# Patient Record
Sex: Female | Born: 1955
Health system: Southern US, Community
[De-identification: ages and names within clinical notes are randomized; demographics above are authoritative.]

## PROBLEM LIST (undated history)

## (undated) DIAGNOSIS — G4733 Obstructive sleep apnea (adult) (pediatric): Secondary | ICD-10-CM

## (undated) DIAGNOSIS — M1990 Unspecified osteoarthritis, unspecified site: Secondary | ICD-10-CM

## (undated) DIAGNOSIS — I10 Essential (primary) hypertension: Secondary | ICD-10-CM

## (undated) HISTORY — DX: Essential (primary) hypertension: I10

## (undated) HISTORY — DX: Unspecified osteoarthritis, unspecified site: M19.90

## (undated) HISTORY — PX: BREAST BIOPSY: SHX20

## (undated) HISTORY — DX: Obstructive sleep apnea (adult) (pediatric): G47.33

## (undated) HISTORY — PX: OTHER SURGICAL HISTORY: SHX169

## (undated) HISTORY — PX: CHOLECYSTECTOMY: SHX55

---

## 1994-05-31 HISTORY — PX: CHOLECYSTECTOMY, LAPAROSCOPIC: SHX56

## 1998-07-10 ENCOUNTER — Encounter: Payer: Self-pay | Admitting: Obstetrics and Gynecology

## 1998-07-10 ENCOUNTER — Ambulatory Visit (HOSPITAL_COMMUNITY): Admission: RE | Admit: 1998-07-10 | Discharge: 1998-07-10 | Payer: Self-pay | Admitting: Obstetrics and Gynecology

## 1998-07-11 ENCOUNTER — Other Ambulatory Visit: Admission: RE | Admit: 1998-07-11 | Discharge: 1998-07-11 | Payer: Self-pay | Admitting: Obstetrics and Gynecology

## 2001-05-26 ENCOUNTER — Encounter: Payer: Self-pay | Admitting: Internal Medicine

## 2001-05-26 ENCOUNTER — Ambulatory Visit (HOSPITAL_COMMUNITY): Admission: RE | Admit: 2001-05-26 | Discharge: 2001-05-26 | Payer: Self-pay | Admitting: Internal Medicine

## 2003-05-03 ENCOUNTER — Ambulatory Visit (HOSPITAL_COMMUNITY): Admission: RE | Admit: 2003-05-03 | Discharge: 2003-05-03 | Payer: Self-pay | Admitting: Internal Medicine

## 2007-09-28 ENCOUNTER — Ambulatory Visit (HOSPITAL_COMMUNITY): Admission: RE | Admit: 2007-09-28 | Discharge: 2007-09-28 | Payer: Self-pay | Admitting: Internal Medicine

## 2009-08-05 ENCOUNTER — Ambulatory Visit (HOSPITAL_COMMUNITY): Admission: RE | Admit: 2009-08-05 | Discharge: 2009-08-05 | Payer: Self-pay | Admitting: Internal Medicine

## 2010-12-24 ENCOUNTER — Other Ambulatory Visit: Payer: Self-pay | Admitting: Internal Medicine

## 2010-12-24 DIAGNOSIS — Z1231 Encounter for screening mammogram for malignant neoplasm of breast: Secondary | ICD-10-CM

## 2010-12-31 ENCOUNTER — Ambulatory Visit (HOSPITAL_COMMUNITY)
Admission: RE | Admit: 2010-12-31 | Discharge: 2010-12-31 | Disposition: A | Discharge status: Home / Self Care | Payer: 59 | Source: Ambulatory Visit | Attending: Internal Medicine | Admitting: Internal Medicine

## 2010-12-31 DIAGNOSIS — Z1231 Encounter for screening mammogram for malignant neoplasm of breast: Secondary | ICD-10-CM

## 2011-05-15 ENCOUNTER — Ambulatory Visit (INDEPENDENT_AMBULATORY_CARE_PROVIDER_SITE_OTHER): Payer: 59

## 2011-05-15 DIAGNOSIS — J019 Acute sinusitis, unspecified: Secondary | ICD-10-CM

## 2011-06-01 DIAGNOSIS — G4733 Obstructive sleep apnea (adult) (pediatric): Secondary | ICD-10-CM

## 2011-06-01 HISTORY — DX: Obstructive sleep apnea (adult) (pediatric): G47.33

## 2011-06-02 ENCOUNTER — Ambulatory Visit (INDEPENDENT_AMBULATORY_CARE_PROVIDER_SITE_OTHER): Payer: 59

## 2011-06-02 DIAGNOSIS — J019 Acute sinusitis, unspecified: Secondary | ICD-10-CM

## 2011-06-02 DIAGNOSIS — H73019 Bullous myringitis, unspecified ear: Secondary | ICD-10-CM

## 2011-06-25 ENCOUNTER — Ambulatory Visit (INDEPENDENT_AMBULATORY_CARE_PROVIDER_SITE_OTHER): Payer: 59

## 2011-06-25 ENCOUNTER — Other Ambulatory Visit: Payer: Self-pay | Admitting: Family Medicine

## 2011-06-25 DIAGNOSIS — M79609 Pain in unspecified limb: Secondary | ICD-10-CM

## 2011-06-25 DIAGNOSIS — N951 Menopausal and female climacteric states: Secondary | ICD-10-CM

## 2011-06-25 DIAGNOSIS — Z0000 Encounter for general adult medical examination without abnormal findings: Secondary | ICD-10-CM

## 2011-06-25 DIAGNOSIS — I10 Essential (primary) hypertension: Secondary | ICD-10-CM

## 2011-06-29 LAB — PAP IG (IMAGE GUIDED)

## 2011-07-05 ENCOUNTER — Other Ambulatory Visit: Payer: Self-pay | Admitting: Family Medicine

## 2011-07-05 MED ORDER — LISINOPRIL-HYDROCHLOROTHIAZIDE 10-12.5 MG PO TABS: 1.0000 | ORAL_TABLET | Freq: Every day | ORAL | Status: DC

## 2011-07-07 ENCOUNTER — Telehealth: Payer: Self-pay

## 2011-07-07 NOTE — Telephone Encounter (Signed)
SPOKE WITH PT AND ADVISED PAP NORMAL BUT NOW WANTS TO KNOW ABOUT HER BLOODWORK? PLEASE ADVISE

## 2011-07-27 ENCOUNTER — Telehealth: Payer: Self-pay

## 2011-07-27 NOTE — Telephone Encounter (Signed)
Medical records please put the paper chart [phone] in referrals stack need them to finish a referral thanks donna

## 2011-09-29 ENCOUNTER — Ambulatory Visit (INDEPENDENT_AMBULATORY_CARE_PROVIDER_SITE_OTHER): Payer: 59 | Admitting: Internal Medicine

## 2011-09-29 ENCOUNTER — Encounter: Payer: Self-pay | Admitting: Internal Medicine

## 2011-09-29 VITALS — BP 123/73 | HR 81 | Temp 98.4°F | Resp 16 | Ht 66.0 in | Wt 236.0 lb

## 2011-09-29 DIAGNOSIS — M542 Cervicalgia: Secondary | ICD-10-CM

## 2011-09-29 DIAGNOSIS — R209 Unspecified disturbances of skin sensation: Secondary | ICD-10-CM

## 2011-09-29 DIAGNOSIS — G4730 Sleep apnea, unspecified: Secondary | ICD-10-CM

## 2011-09-29 DIAGNOSIS — R202 Paresthesia of skin: Secondary | ICD-10-CM

## 2011-09-29 DIAGNOSIS — Z6833 Body mass index (BMI) 33.0-33.9, adult: Secondary | ICD-10-CM

## 2011-09-29 MED ORDER — FLUTICASONE PROPIONATE 50 MCG/ACT NA SUSP: 2.0000 | Freq: Every day | NASAL | Status: DC

## 2011-09-29 MED ORDER — LISINOPRIL-HYDROCHLOROTHIAZIDE 10-12.5 MG PO TABS: 1.0000 | ORAL_TABLET | Freq: Every day | ORAL | Status: DC

## 2011-09-29 NOTE — Progress Notes (Signed)
  Subjective:    Patient ID: Jamie Glover, female    DOB: 05-20-1956, 56 y.o.   MRN: [id number]  HPIShe is very concerned about paresthesias and pain in both hands that wake her up at night. These chiefly concerned the thumb and first 2 fingers on both hands. This started 6 months ago and has increased in frequency and intensity ever since. The pain in the left arm will move up toward the elbow at times. This resolves within 15-30 minutes of awakening. She cannot attach the occurrence to a particular sleeping position. She does not have daytime weakness. She spends a lot of time at the computer keyboard but has no carpal tunnel symptoms in the daytime.  She has a new diagnosis of obstructive sleep apnea and will start on CPAP next week. Her history suggests this diagnosis could have been made 6-7 years ago   Social history= has started yoga  Review of Systems  Constitutional: Negative for fever, activity change and unexpected weight change.  HENT: Positive for neck stiffness and tinnitus. Negative for ear pain.   Eyes: Negative for visual disturbance.  Neurological: Negative for tremors, weakness, light-headedness and headaches.  Psychiatric/Behavioral: Negative for dysphoric mood. The patient is not nervous/anxious.   She's had 2 ear infections requiring antibiotics in the last year one of these had a traumatic perforation. She has no hearing loss from this but continues with her chronic tinnitus which is mild/she notes clicking in her right ear with swallowing//She is on Claritin and Flonase     Objective:   Physical Exam Vital signs stable/She is overweight Pupils equal round reactive to light and accommodation/extraocular movements conjugate  TMs are patent though the left is opaque Nares are boggy  throat is clear/Pharynx is shallow No nodes or thyromegaly The neck range of motion is full without stiffness or pain Except for mild limitation of forward flexion Upper extremities have  full pulses and full sensation with good range of motion about the  Shoulders and elbows and wrists/finger thumb opposition is full/grip is symmetrical/reflexes are not exaggerated       Assessment & Plan:  Problem #1 paresthesias and pain in both hands Needs MRI of the C-spine to rule out cervical stenosis Will consider EMG These symptoms suggest a correlation with posture since they occur only at night With mild increase in reflexes at the left wrist compared to the right will need this retested at subsequent visits  Problem #2 obesity Problem #3 obstructive sleep apnea Problem #4 allergic rhinitis Problem #5 hypertension

## 2011-10-01 DIAGNOSIS — K219 Gastro-esophageal reflux disease without esophagitis: Secondary | ICD-10-CM | POA: Insufficient documentation

## 2011-10-01 DIAGNOSIS — E559 Vitamin D deficiency, unspecified: Secondary | ICD-10-CM | POA: Insufficient documentation

## 2011-10-01 DIAGNOSIS — H9319 Tinnitus, unspecified ear: Secondary | ICD-10-CM | POA: Insufficient documentation

## 2011-10-01 DIAGNOSIS — I10 Essential (primary) hypertension: Secondary | ICD-10-CM | POA: Insufficient documentation

## 2011-10-01 DIAGNOSIS — M542 Cervicalgia: Secondary | ICD-10-CM | POA: Insufficient documentation

## 2011-10-11 ENCOUNTER — Ambulatory Visit
Admission: RE | Admit: 2011-10-11 | Discharge: 2011-10-11 | Disposition: A | Discharge status: Home / Self Care | Payer: 59 | Source: Ambulatory Visit | Attending: Internal Medicine | Admitting: Internal Medicine

## 2011-10-11 DIAGNOSIS — R202 Paresthesia of skin: Secondary | ICD-10-CM

## 2011-10-19 ENCOUNTER — Telehealth: Payer: Self-pay | Admitting: Radiology

## 2011-10-19 DIAGNOSIS — M5412 Radiculopathy, cervical region: Secondary | ICD-10-CM

## 2011-10-19 NOTE — Telephone Encounter (Signed)
Pt CB and I explained results and message from Dr Merla Riches. Pt agreed to neuro referral.

## 2011-10-19 NOTE — Telephone Encounter (Signed)
Message copied by Luretha Murphy on Tue Oct 19, 2011  2:14 PM ------      Message from: POE, Dinah Beers C      Created: Mon Oct 18, 2011  8:26 AM                   ----- Message -----         From: Tonye Pearson, MD         Sent: 10/16/2011  10:40 AM           To: [facility]            Please call      The MRI Shows some chronic changes but nothing dangerous and does not specifically identify a cause for the symptoms she has      This may respond to physical therapy for the neck but first we should set up a neurological evaluation if she is agreeable

## 2011-10-19 NOTE — Telephone Encounter (Signed)
LMOM TO CB 

## 2011-10-20 ENCOUNTER — Encounter: Payer: Self-pay | Admitting: Neurology

## 2011-11-08 ENCOUNTER — Ambulatory Visit (INDEPENDENT_AMBULATORY_CARE_PROVIDER_SITE_OTHER): Payer: 59 | Admitting: Family Medicine

## 2011-11-08 VITALS — BP 126/76 | HR 73 | Temp 98.1°F | Resp 16 | Ht 66.0 in | Wt 240.0 lb

## 2011-11-08 DIAGNOSIS — R300 Dysuria: Secondary | ICD-10-CM

## 2011-11-08 DIAGNOSIS — R350 Frequency of micturition: Secondary | ICD-10-CM

## 2011-11-08 LAB — POCT URINALYSIS DIPSTICK
Glucose, UA: NEGATIVE
Nitrite, UA: NEGATIVE
Spec Grav, UA: 1.015
Urobilinogen, UA: 0.2

## 2011-11-08 LAB — POCT UA - MICROSCOPIC ONLY: Epithelial cells, urine per micros: NEGATIVE

## 2011-11-08 MED ORDER — CEPHALEXIN 500 MG PO CAPS: 500.0000 mg | ORAL_CAPSULE | Freq: Three times a day (TID) | ORAL | Status: AC

## 2011-11-08 NOTE — Patient Instructions (Signed)

## 2011-11-08 NOTE — Progress Notes (Signed)
  Subjective:    Patient ID: Jamie Glover, female    DOB: 1956/04/20, 56 y.o.   MRN: [id number]  HPI DYSURIA Onset:  2 days  Description: dysuria, increased urinary frequency, increased urgency  Modifying factors: history of UTIs in the past   Symptoms Urgency:  yes Frequency: yes  Hesitancy:  no Hematuria:  no Flank Pain:  no Fever: no Nausea/Vomiting:  no Missed LMP: no STD exposure: no Discharge: no Irritants: no Rash: no  Red Flags   More than 3 UTI's last 12 months:  no PMH of  Diabetes or Immunosuppression:  no Renal Disease/Calculi: no Urinary Tract Abnormality:  no Instrumentation or Trauma: no   Review of Systems See HPI, otherwise ROS negative     Objective:   Physical Exam Gen: up in chair, NAD HEENT: NCAT, EOMI, TMs clear bilaterally CV: RRR, no murmurs auscultated PULM: CTAB, no wheezes, rales, rhoncii ABD: S/NT/+ bowel sounds, no suprapubic tenderness EXT: 2+ peripheral pulses    Assessment & Plan:   Clinically with UTI. Will treat with keflex x 7 days. Urine culture.  Discussed infectious red flags.  Follow up as needed.  Handout given.     The patient and/or caregiver has been counseled thoroughly with regard to treatment plan and/or medications prescribed including dosage, schedule, interactions, rationale for use, and possible side effects and they verbalize understanding. Diagnoses and expected course of recovery discussed and will return if not improved as expected or if the condition worsens. Patient and/or caregiver verbalized understanding.

## 2011-11-11 LAB — URINE CULTURE: Colony Count: >=100000

## 2011-11-23 ENCOUNTER — Telehealth: Payer: Self-pay

## 2011-11-23 NOTE — Telephone Encounter (Signed)
Dineen, Conradt would like you to call her regarding her latest OV. Cell phone until 3:30  [phone]

## 2011-11-23 NOTE — Telephone Encounter (Signed)
Spoke with patient. She is no longer having any urinary symptoms so she will follow up if any symptoms return per Dr. Furnas Blas request.

## 2011-12-09 ENCOUNTER — Ambulatory Visit (INDEPENDENT_AMBULATORY_CARE_PROVIDER_SITE_OTHER): Payer: 59 | Admitting: Neurology

## 2011-12-09 ENCOUNTER — Encounter: Payer: Self-pay | Admitting: Neurology

## 2011-12-09 VITALS — BP 118/76 | HR 74 | Ht 66.25 in | Wt 243.0 lb

## 2011-12-09 DIAGNOSIS — M5412 Radiculopathy, cervical region: Secondary | ICD-10-CM

## 2011-12-09 NOTE — Progress Notes (Signed)
Dear Dr. Merla Riches,  Thank you for having me see Jamie Glover in consultation today at [facility] for her problem with bilateral numbness in her hands and arms.  As you may recall, she is a 56 y.o. year old female with a history of HTN and OSA who is having problems mostly at night with tingling in her 1st and 2nd digit of both hands, worse on the left.  This radiates to her forearms.  Somewhat better when she shakes it out.  No neck pain. MRI of C-spine unremarkable.  Better during the day.  Past Medical History  Diagnosis Date  . Hypertension   . OSA (obstructive sleep apnea) 2013    Past Surgical History  Procedure Date  . C-sections   . Cholecystectomy, laparoscopic 1996  . Right knee surgery     History   Social History  . Marital Status: Married    Spouse Name: N/A    Number of Children: N/A  . Years of Education: N/A   Social History Main Topics  . Smoking status: Never Smoker   . Smokeless tobacco: Never Used  . Alcohol Use: No  . Drug Use: No  . Sexually Active: None   Other Topics Concern  . None   Social History Narrative  . None    No family history on file.  Current Outpatient Prescriptions on File Prior to Visit  Medication Sig Dispense Refill  . aspirin 325 MG tablet Take 325 mg by mouth daily.      . calcium-vitamin D 250-100 MG-UNIT per tablet Take by mouth daily.      . fish oil-omega-3 fatty acids 1000 MG capsule Take 2 g by mouth daily.      . fluticasone (FLONASE) 50 MCG/ACT nasal spray Place 2 sprays into the nose daily.  16 g  11  . lisinopril-hydrochlorothiazide (PRINZIDE,ZESTORETIC) 10-12.5 MG per tablet Take 1 tablet by mouth daily.  90 tablet  3  . Loratadine-Pseudoephedrine (PX ALLERGY RELIEF D, LORATID, PO) Take by mouth.      Marland Kitchen MAGNESIUM PO Take by mouth daily.      . Multiple Vitamin (MULTIVITAMIN) tablet Take 1 tablet by mouth daily.      . Red Yeast Rice Extract (RED YEAST RICE PO) Take by mouth daily.        Allergies    Allergen Reactions  . Other Other (See Comments)    ORAL ALCOHOL CAUSES HEART RACE (TACHYCARDIA0  . Macrodantin (Nitrofurantoin Macrocrystal) Nausea Only and Rash      ROS:  13 systems were reviewed and  are unremarkable.   Examination:  Filed Vitals:   12/09/11 1322  BP: 118/76  Pulse: 74  Height: 5' 6.25" (1.683 m)  Weight: 243 lb (110.224 kg)     In general, well appearing women.   Cranial Nerves: Extraocular movements are intact without nystagmus. Facial sensation and muscles of mastication are intact. Muscles of facial expression are symmetric.  Tongue protrusion, uvula, palate midline.  Shoulder shrug intact  Motor:  The patient has normal bulk and tone, no pronator drift.  There are no adventitious movements.  Intrinsic muscles of hands strong.  5/5 muscle strength bilaterally.  Reflexes:   Biceps  Triceps Brachioradialis Knee Ankle  Right 2+  2+  2+   2+ 2+  Left  2+  2+  2+   2+ 2+  Toes down  Coordination:  Normal finger to nose.  No dysdiadokinesia.  Sensation is impaired in a median nerve  distribution(but sparing palm) in bilateral hands to pin.  Gait and Station are normal.  Tandem gait is intact.  Romberg is negative  Tinel's negative at wrist, Phalen's positive.   Impression/Recs: 1.  Bilateral hand tingling and numbness - this is almost certainly CTS.  It commonly radiates up the arm.  I would get a NCS to confirm but patient would like to wait on this.  She will call if it worsens.   Thank you for having [place] see Jamie Glover in consultation.  Feel free to contact me with any questions.  Lupita Raider Modesto Charon, MD [facility],  [address] [place], [place] [address] Phone: [phone] Fax: [phone].

## 2012-01-24 ENCOUNTER — Other Ambulatory Visit: Payer: Self-pay | Admitting: Internal Medicine

## 2012-01-24 DIAGNOSIS — Z1231 Encounter for screening mammogram for malignant neoplasm of breast: Secondary | ICD-10-CM

## 2012-01-27 ENCOUNTER — Ambulatory Visit (HOSPITAL_COMMUNITY)
Admission: RE | Admit: 2012-01-27 | Discharge: 2012-01-27 | Disposition: A | Discharge status: Home / Self Care | Payer: 59 | Source: Ambulatory Visit | Attending: Internal Medicine | Admitting: Internal Medicine

## 2012-01-27 DIAGNOSIS — Z1231 Encounter for screening mammogram for malignant neoplasm of breast: Secondary | ICD-10-CM | POA: Insufficient documentation

## 2012-06-26 ENCOUNTER — Ambulatory Visit (INDEPENDENT_AMBULATORY_CARE_PROVIDER_SITE_OTHER): Payer: 59 | Admitting: Physician Assistant

## 2012-06-26 VITALS — BP 129/76 | HR 100 | Temp 99.0°F | Resp 17 | Ht 67.0 in | Wt 241.0 lb

## 2012-06-26 DIAGNOSIS — R350 Frequency of micturition: Secondary | ICD-10-CM

## 2012-06-26 DIAGNOSIS — R300 Dysuria: Secondary | ICD-10-CM

## 2012-06-26 DIAGNOSIS — N390 Urinary tract infection, site not specified: Secondary | ICD-10-CM

## 2012-06-26 LAB — POCT URINALYSIS DIPSTICK
Bilirubin, UA: NEGATIVE
Glucose, UA: NEGATIVE
Ketones, UA: NEGATIVE
Nitrite, UA: NEGATIVE
Protein, UA: 30
Spec Grav, UA: 1.02
Urobilinogen, UA: 1
pH, UA: 7

## 2012-06-26 LAB — POCT UA - MICROSCOPIC ONLY
Casts, Ur, LPF, POC: NEGATIVE
Crystals, Ur, HPF, POC: NEGATIVE
Mucus, UA: NEGATIVE
Yeast, UA: NEGATIVE

## 2012-06-26 MED ORDER — CEPHALEXIN 500 MG PO CAPS: 500.0000 mg | ORAL_CAPSULE | Freq: Three times a day (TID) | ORAL | Status: DC

## 2012-06-26 NOTE — Progress Notes (Signed)
Patient ID: Jamie Glover MRN: [id number], DOB: 07-Apr-1956, 57 y.o. Date of Encounter: 06/26/2012, 10:27 AM  Primary Physician: Tonye Pearson, MD  Chief Complaint: urinary frequency and dysuria  HPI: 57 y.o. year old female with presents with 2 day history of urinary frequency, dysuria, and hematuria. Complains of slight right sided flank pain.  No nausea, vomiting, fever, chills, abdominal pain, vaginal discharge, or odor. Has not taken any medications. Patient is otherwise doing well without issues or complaints.  Past Medical History  Diagnosis Date  . Hypertension   . OSA (obstructive sleep apnea) 2013     Home Meds: Prior to Admission medications   Medication Sig Start Date End Date Taking? Authorizing Provider  aspirin 325 MG tablet Take 325 mg by mouth daily.   Yes Historical Provider, MD  calcium-vitamin D 250-100 MG-UNIT per tablet Take by mouth daily.   Yes Historical Provider, MD  fish oil-omega-3 fatty acids 1000 MG capsule Take 2 g by mouth daily.   Yes Historical Provider, MD  fluticasone (FLONASE) 50 MCG/ACT nasal spray Place 2 sprays into the nose daily. 09/29/11  Yes Tonye Pearson, MD  lisinopril-hydrochlorothiazide (PRINZIDE,ZESTORETIC) 10-12.5 MG per tablet Take 1 tablet by mouth daily. 09/29/11 09/28/12 Yes Tonye Pearson, MD  Loratadine-Pseudoephedrine [facility] ALLERGY RELIEF D, LORATID, PO) Take by mouth.   Yes Historical Provider, MD  MAGNESIUM PO Take by mouth daily.   Yes Historical Provider, MD  Multiple Vitamin (MULTIVITAMIN) tablet Take 1 tablet by mouth daily.   Yes Historical Provider, MD  Red Yeast Rice Extract (RED YEAST RICE PO) Take by mouth daily.   Yes Historical Provider, MD  cephALEXin (KEFLEX) 500 MG capsule Take 1 capsule (500 mg total) by mouth 3 (three) times daily. 06/26/12   Nelva Nay, PA-C    Allergies:  Allergies  Allergen Reactions  . Other Other (See Comments)    ORAL ALCOHOL CAUSES HEART RACE (TACHYCARDIA0  . Macrodantin  (Nitrofurantoin Macrocrystal) Nausea Only and Rash    History   Social History  . Marital Status: Married    Spouse Name: N/A    Number of Children: N/A  . Years of Education: N/A   Occupational History  . Not on file.   Social History Main Topics  . Smoking status: Never Smoker   . Smokeless tobacco: Never Used  . Alcohol Use: No  . Drug Use: No  . Sexually Active: Not on file   Other Topics Concern  . Not on file   Social History Narrative  . No narrative on file     Review of Systems: Constitutional: negative for chills, fever, night sweats, weight changes, or fatigue  HEENT: negative for vision changes, hearing loss, congestion, rhinorrhea, ST, epistaxis, or sinus pressure Cardiovascular: negative for chest pain or palpitations Respiratory: negative for cough, hemoptysis, wheezing, shortness of breath. Abdominal: positive for suprapubic abdominal pain,   No nausea, vomiting, diarrhea, or constipation Genitourinary: positive for urinary frequency and dysuria. Negative for vaginal discharge, odor, or pelvic pain.  Dermatological: negative for rashes. Neurologic: negative for headache, dizziness, or syncope   Physical Exam: Blood pressure 129/76, pulse 100, temperature 99 F (37.2 C), temperature source Oral, resp. rate 17, height 5\' 7"  (1.702 m), weight 241 lb (109.317 kg), last menstrual period 12/01/2010, SpO2 93.00%., Body mass index is 37.75 kg/(m^2). General: Well developed, well nourished, in no acute distress. Head: Normocephalic, atraumatic, eyes without discharge, sclera non-icteric, nares are without discharge. External ear normal in appearance. Neck:  Supple. No thyromegaly. Full ROM. No lymphadenopathy. Lungs: Clear bilaterally to auscultation without wheezes, rales, or rhonchi. Breathing is unlabored. Heart: RRR with S1 S2. No murmurs, rubs, or gallops appreciated. Abdominal: +BS x 4. No hepatosplenomegaly, rebound tenderness, or guarding. Positive  suprapubic tenderness. No CVA tenderness bilaterally.  Msk:  Strength and tone normal for 57. Extremities/Skin: Warm and dry. No clubbing or cyanosis. No edema.  Neuro: Alert and oriented X 3. Moves all extremities spontaneously. Gait is normal. CNII-XII grossly in tact. Psych:  Responds to questions appropriately with a normal affect.   Labs:  Results for orders placed in visit on 06/26/12  POCT UA - MICROSCOPIC ONLY      Component Value Range   WBC, Ur, HPF, POC TNTC     RBC, urine, microscopic 25-30     Bacteria, U Microscopic trace     Mucus, UA neg     Epithelial cells, urine per micros 0-1     Crystals, Ur, HPF, POC neg     Casts, Ur, LPF, POC neg     Yeast, UA neg    POCT URINALYSIS DIPSTICK      Component Value Range   Color, UA yellow     Clarity, UA cloudy     Glucose, UA neg     Bilirubin, UA neg     Ketones, UA neg     Spec Grav, UA 1.020     Blood, UA large     pH, UA 7.0     Protein, UA 30     Urobilinogen, UA 1.0     Nitrite, UA neg     Leukocytes, UA large (3+)        ASSESSMENT AND PLAN:  57 y.o. year old female with urinary tract infection Urine culture sent. Last culture showed E. Coli resistant to a number of antibiotics. If patient reports no improvement in 48 hours, will try either Macrobid or Fosphomycin.   Patient reports last UTI she improved with Keflex, so will try this.  Increase fluids Keflex 500 mg tid x 7 days Follow up if symptoms worsen or fail to improve.  Grier Mitts, PA-C 06/26/2012 10:27 AM

## 2012-06-30 LAB — URINE CULTURE: Colony Count: >=100000

## 2012-07-03 ENCOUNTER — Other Ambulatory Visit: Payer: Self-pay | Admitting: Physician Assistant

## 2012-07-03 MED ORDER — NITROFURANTOIN MONOHYD MACRO 100 MG PO CAPS: 100.0000 mg | ORAL_CAPSULE | Freq: Two times a day (BID) | ORAL | Status: DC

## 2012-07-03 NOTE — Progress Notes (Signed)
Per patient had macrobid in hospital while on several other medications and developed nausea.  She is ok to try this and will let us know if she cannot tolerate it. Denies anyphylaxis or trouble breathing.

## 2012-08-31 ENCOUNTER — Encounter: Payer: Self-pay | Admitting: Internal Medicine

## 2012-10-04 ENCOUNTER — Other Ambulatory Visit: Payer: Self-pay | Admitting: Internal Medicine

## 2012-11-11 ENCOUNTER — Ambulatory Visit (INDEPENDENT_AMBULATORY_CARE_PROVIDER_SITE_OTHER): Payer: 59 | Admitting: Family Medicine

## 2012-11-11 VITALS — BP 140/80 | HR 74 | Temp 98.2°F | Resp 16 | Ht 66.25 in | Wt 231.6 lb

## 2012-11-11 DIAGNOSIS — I10 Essential (primary) hypertension: Secondary | ICD-10-CM

## 2012-11-11 LAB — POCT CBC
Granulocyte percent: 64.6 %G (ref 37–80)
HCT, POC: 42.9 % (ref 37.7–47.9)
Hemoglobin: 13.9 g/dL (ref 12.2–16.2)
Lymph, poc: 1.8 (ref 0.6–3.4)
MCH, POC: 32.5 pg — AB (ref 27–31.2)
MCHC: 32.4 g/dL (ref 31.8–35.4)
MCV: 100.3 fL — AB (ref 80–97)
MID (cbc): 0.6 (ref 0–0.9)
MPV: 9.4 fL (ref 0–99.8)
POC Granulocyte: 4.3 (ref 2–6.9)
POC LYMPH PERCENT: 26.8 %L (ref 10–50)
POC MID %: 8.6 %M (ref 0–12)
Platelet Count, POC: 230 10*3/uL (ref 142–424)
RBC: 4.28 M/uL (ref 4.04–5.48)
RDW, POC: 14.1 %
WBC: 6.6 10*3/uL (ref 4.6–10.2)

## 2012-11-11 LAB — LIPID PANEL
Cholesterol: 213 mg/dL — ABNORMAL HIGH (ref 0–200)
HDL: 54 mg/dL (ref >39)
LDL Cholesterol: 114 mg/dL — ABNORMAL HIGH (ref 0–99)
Total CHOL/HDL Ratio: 3.9 Ratio
Triglycerides: 224 mg/dL — ABNORMAL HIGH (ref <150)
VLDL: 45 mg/dL — ABNORMAL HIGH (ref 0–40)

## 2012-11-11 LAB — COMPREHENSIVE METABOLIC PANEL
ALT: 23 U/L (ref 0–35)
AST: 17 U/L (ref 0–37)
Albumin: 4.3 g/dL (ref 3.5–5.2)
Alkaline Phosphatase: 82 U/L (ref 39–117)
BUN: 14 mg/dL (ref 6–23)
CO2: 27 mEq/L (ref 19–32)
Calcium: 8.9 mg/dL (ref 8.4–10.5)
Chloride: 100 mEq/L (ref 96–112)
Creat: 0.67 mg/dL (ref 0.50–1.10)
Glucose, Bld: 98 mg/dL (ref 70–99)
Potassium: 4 mEq/L (ref 3.5–5.3)
Sodium: 137 mEq/L (ref 135–145)
Total Bilirubin: 0.8 mg/dL (ref 0.3–1.2)
Total Protein: 6.5 g/dL (ref 6.0–8.3)

## 2012-11-11 MED ORDER — LISINOPRIL-HYDROCHLOROTHIAZIDE 10-12.5 MG PO TABS: 1.0000 | ORAL_TABLET | Freq: Every day | ORAL | Status: DC

## 2012-11-11 NOTE — Progress Notes (Signed)
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  She has had last CPE one year ago.  Due for colonoscopy in 3 years  Had mammogram this year Gets full derm screen yearly Annual vision check  Objective:  NAD Chest:  Clear Heart: reg, no murmur Ext:  Mild erythema above ankles with slight edema Fundi:  Normal  Assessment:  Stable on current meds except for slight edema Hypertension - Plan: Comprehensive metabolic panel, POCT CBC, Lipid panel, lisinopril-hydrochlorothiazide (PRINZIDE,ZESTORETIC) 10-12.5 MG per tablet Support hose recommended to prevent postphlebitic changes  Signed, Elvina Sidle, MD

## 2012-11-11 NOTE — Patient Instructions (Signed)
Elastics therapy in [place]  [phone]   Hypertension As your heart beats, it forces blood through your arteries. This force is your blood pressure. If the pressure is too high, it is called hypertension (HTN) or high blood pressure. HTN is dangerous because you may have it and not know it. High blood pressure may mean that your heart has to work harder to pump blood. Your arteries may be narrow or stiff. The extra work puts you at risk for heart disease, stroke, and other problems.  Blood pressure consists of two numbers, a higher number over a lower, 110/72, for example. It is stated as "110 over 72." The ideal is below 120 for the top number (systolic) and under 80 for the bottom (diastolic). Write down your blood pressure today. You should pay close attention to your blood pressure if you have certain conditions such as:  Heart failure.  Prior heart attack.  Diabetes  Chronic kidney disease.  Prior stroke.  Multiple risk factors for heart disease. To see if you have HTN, your blood pressure should be measured while you are seated with your arm held at the level of the heart. It should be measured at least twice. A one-time elevated blood pressure reading (especially in the Emergency Department) does not mean that you need treatment. There may be conditions in which the blood pressure is different between your right and left arms. It is important to see your caregiver soon for a recheck. Most people have essential hypertension which means that there is not a specific cause. This type of high blood pressure may be lowered by changing lifestyle factors such as:  Stress.  Smoking.  Lack of exercise.  Excessive weight.  Drug/tobacco/alcohol use.  Eating less salt. Most people do not have symptoms from high blood pressure until it has caused damage to the body. Effective treatment can often prevent, delay or reduce that damage. TREATMENT  When a cause has been identified,  treatment for high blood pressure is directed at the cause. There are a large number of medications to treat HTN. These fall into several categories, and your caregiver will help you select the medicines that are best for you. Medications may have side effects. You should review side effects with your caregiver. If your blood pressure stays high after you have made lifestyle changes or started on medicines,   Your medication(s) may need to be changed.  Other problems may need to be addressed.  Be certain you understand your prescriptions, and know how and when to take your medicine.  Be sure to follow up with your caregiver within the time frame advised (usually within two weeks) to have your blood pressure rechecked and to review your medications.  If you are taking more than one medicine to lower your blood pressure, make sure you know how and at what times they should be taken. Taking two medicines at the same time can result in blood pressure that is too low. SEEK IMMEDIATE MEDICAL CARE IF:  You develop a severe headache, blurred or changing vision, or confusion.  You have unusual weakness or numbness, or a faint feeling.  You have severe chest or abdominal pain, vomiting, or breathing problems. MAKE SURE YOU:   Understand these instructions.  Will watch your condition.  Will get help right away if you are not doing well or get worse. Document Released: 05/17/2005 Document Revised: 08/09/2011 Document Reviewed: 01/05/2008 [facility] Patient Information 2014 [place], [place].

## 2013-04-05 ENCOUNTER — Other Ambulatory Visit: Payer: Self-pay

## 2013-11-09 ENCOUNTER — Other Ambulatory Visit: Payer: Self-pay | Admitting: Family Medicine

## 2014-01-02 ENCOUNTER — Other Ambulatory Visit: Payer: Self-pay | Admitting: Physician Assistant

## 2014-06-10 ENCOUNTER — Other Ambulatory Visit (HOSPITAL_COMMUNITY): Payer: Self-pay | Admitting: Family Medicine

## 2014-06-10 DIAGNOSIS — Z1231 Encounter for screening mammogram for malignant neoplasm of breast: Secondary | ICD-10-CM

## 2014-07-01 ENCOUNTER — Ambulatory Visit (HOSPITAL_COMMUNITY)
Admission: RE | Admit: 2014-07-01 | Discharge: 2014-07-01 | Disposition: A | Discharge status: Home / Self Care | Payer: BC Managed Care – PPO | Source: Ambulatory Visit | Attending: Family Medicine | Admitting: Family Medicine

## 2014-07-01 DIAGNOSIS — Z1231 Encounter for screening mammogram for malignant neoplasm of breast: Secondary | ICD-10-CM | POA: Insufficient documentation

## 2014-08-14 ENCOUNTER — Ambulatory Visit (INDEPENDENT_AMBULATORY_CARE_PROVIDER_SITE_OTHER): Payer: BC Managed Care – PPO | Admitting: Nurse Practitioner

## 2014-08-14 ENCOUNTER — Encounter: Payer: Self-pay | Admitting: Nurse Practitioner

## 2014-08-14 VITALS — BP 113/65 | HR 60 | Ht 66.25 in | Wt 235.2 lb

## 2014-08-14 DIAGNOSIS — G4730 Sleep apnea, unspecified: Secondary | ICD-10-CM

## 2014-08-14 NOTE — Progress Notes (Signed)
[facility]  PATIENT: Jamie Glover DOB: 22-May-1956   REASON FOR VISIT: Follow-up for obstructive sleep apnea  HISTORY FROM: Patient    HISTORY OF PRESENT ILLNESS: Jamie Glover, 59 year old female returns for follow-up. She was last seen in this office 07/07/2012. She has a history of obstructive sleep apnea. She tells me she recently has not been compliant with her mask it does not fit properly and she has been trying to work with [facility] home [facility] to get a different fit without success. She did not bring her chip in today for a download since she has been noncompliant. She does not complain of excessive daytime sleepiness nor excessive fatigue she has used a nasal pillow in the past but caused side effects of dry eyes. She claims her current mask is several years old and needs replaced. She is wanting to switch to a different durable medical equipment company. She returns for reevaluation   HISTORY:This right handed female is a patient of Dr. Laney Pastor [facility].  Her sleep consult is today based on a recent PSG and split titration.  This patient endorsed the Epworth score at 15 points before CPAP and now at the same level after.  She had improved her snoring  and her apneas. She wakes up with some hand pain and numbness  and has mild carpal tunnel.  The patient had 20 AHI and 40 RDI in March, returned for a full titration in April and has been titrated to 9 cm water with 3 cm EPR, resulting AHI for the whole night was 2.  Her oxygen desaturations in the baseline study were  extreme from 99% down to 80 % with central and obstructive events.  She has been using it faithfully, and we reviewed and discussed the downloads from her CPAP dated  11-02-11 .   Her  residual AHI is 1.4 on 30 days and 90 days download. CMS compliant with 6.30 minutes.  She  likes a nasal pillow mask.  Sleep time and bedtime are variable.  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  She is not a regular exerciser,  but goes to yoga one to two times weekly.    REVIEW OF SYSTEMS: Full 14 system review of systems performed and notable only for those listed, all others are neg:  Constitutional: neg  Cardiovascular: neg Ear/Nose/Throat: Ringing in the ears  Skin: neg Eyes: neg Respiratory: neg Gastroitestinal: neg  Hematology/Lymphatic: neg  Endocrine: neg Musculoskeletal:neg Allergy/Immunology: neg Neurological: Numbness due to mild carpal tunnel Psychiatric: neg Sleep : Obstructive sleep apnea, snoring ALLERGIES: Allergies  Allergen Reactions  . Other Other (See Comments)    ORAL ALCOHOL CAUSES HEART RACE (TACHYCARDIA0  . Macrodantin [Nitrofurantoin Macrocrystal] Nausea Only and Rash    HOME MEDICATIONS: Outpatient Prescriptions Prior to Visit  Medication Sig Dispense Refill  . aspirin 325 MG tablet Take 325 mg by mouth daily.    . calcium-vitamin D 250-100 MG-UNIT per tablet Take by mouth daily.    . fish oil-omega-3 fatty acids 1000 MG capsule Take 2 g by mouth daily.    . fluticasone (FLONASE) 50 MCG/ACT nasal spray USE TWO SPRAYS IN EACH NOSTRIL DAILY (Patient taking differently: USE TWO SPRAYS IN EACH NOSTRIL DAILY PRN) 16 g 0  . lisinopril-hydrochlorothiazide (PRINZIDE,ZESTORETIC) 10-12.5 MG per tablet Take 1 tablet by mouth daily. PATIENT NEEDS OFFICE VISIT FOR ADDITIONAL REFILLS 30 tablet 0  . Loratadine-Pseudoephedrine (  PX ALLERGY RELIEF D, LORATID, PO) Take by mouth.    Marland Kitchen MAGNESIUM PO Take by mouth daily.    . Multiple Vitamin (MULTIVITAMIN) tablet Take 1 tablet by mouth daily.    . Red Yeast Rice Extract (RED YEAST RICE PO) Take by mouth daily.    . cephALEXin (KEFLEX) 500 MG capsule Take 1 capsule (500 mg total) by mouth 3 (three) times daily. (Patient not taking: Reported on 08/14/2014) 21 capsule 0  . nitrofurantoin, macrocrystal-monohydrate, (MACROBID) 100 MG capsule Take 1 capsule (100 mg total) by mouth 2  (two) times daily. (Patient not taking: Reported on 08/14/2014) 14 capsule 0   No facility-administered medications prior to visit.    PAST MEDICAL HISTORY: Past Medical History  Diagnosis Date  . Hypertension   . OSA (obstructive sleep apnea) 2013    PAST SURGICAL HISTORY: Past Surgical History  Procedure Laterality Date  . C-sections    . Cholecystectomy, laparoscopic  1996  . Right knee surgery    . Cholecystectomy      FAMILY HISTORY: Family History  Problem Relation Age of Onset  . Hypertension Mother   . Cancer Mother   . Diabetes Mother   . Hypertension Father   . Alzheimer's disease Father     SOCIAL HISTORY: History   Social History  . Marital Status: Married    Spouse Name: N/A  . Number of Children: N/A  . Years of Education: N/A   Occupational History  . Not on file.   Social History Main Topics  . Smoking status: Never Smoker   . Smokeless tobacco: Never Used  . Alcohol Use: No  . Drug Use: No  . Sexual Activity: Not on file   Other Topics Concern  . Not on file   Social History Narrative     PHYSICAL EXAM  Filed Vitals:   08/14/14 0905  BP: 113/65  Pulse: 60  Height: 5' 6.25" (1.683 m)  Weight: 235 lb 3.2 oz (106.686 kg)   Body mass index is 37.67 kg/(m^2).  Generalized: Well developed, obese female in no acute distress  Head: normocephalic and atraumatic,. Oropharynx benign Mallopatti4 Neck: Supple, no carotid bruits neck size 16,  Cardiac: Regular rate rhythm, no murmur  Musculoskeletal: No deformity   Neurological examination   Mentation: Alert oriented to time, place, history taking. Attention span and concentration appropriate. Recent and remote memory intact.  Follows all commands speech and language fluent. ESS 10. FSS 13.   Cranial nerve II-XII: Pupils were equal round reactive to light extraocular movements were full, visual field were full on confrontational test. Facial sensation and strength were normal. hearing  was intact to finger rubbing bilaterally. Uvula tongue midline. head turning and shoulder shrug were normal and symmetric.Tongue protrusion into cheek strength was normal. Motor: normal bulk and tone, full strength in the BUE, BLE, fine finger movements normal, no pronator drift. No focal weakness Sensory: normal and symmetric to light touch, pinprick, and  Vibration,  Coordination: finger-nose-finger, heel-to-shin bilaterally, no dysmetria Reflexes: Brachioradialis 2/2, biceps 2/2, triceps 2/2, patellar 2/2, Achilles 2/2, plantar responses were flexor bilaterally. Gait and Station: Rising up from seated position without assistance, normal stance,  moderate stride, good arm swing, smooth turning, able to perform tiptoe, and heel walking without difficulty. Tandem gait is steady  DIAGNOSTIC DATA (LABS, IMAGING, TESTING) - I reviewed patient records, labs, notes, testing and imaging myself where available.    ASSESSMENT AND PLAN  59 y.o. year old female  has a past  medical history of Hypertension and OSA (obstructive sleep apnea) (2013). here to follow-up. She has recently been noncompliant with her CPAP mask does not fit advanced home care has not been easy to work with she claims. She also has a history of mild carpal tunnel.  Please try to get different mask for CPAP I explained in particular the risks and ramifications of untreated moderate to severe OSA, especially with respect to cardiovascular disease  including congestive heart failure, difficult to treat hypertension, cardiac arrhythmias, or stroke. Even type 2 diabetes has, in part, been linked to untreated OSA. Symptoms of untreated OSA include daytime sleepiness, memory problems, mood irritability and mood disorder such as depression and anxiety, lack of energy, as well as recurrent headaches, especially morning headaches. We talked about trying to maintain a healthy lifestyle in general, as well as the importance of weight control. I  encouraged the patient to eat healthy, exercise daily and keep well hydrated, to keep a scheduled bedtime and wake time routine, to not skip any meals and eat healthy snacks in between meals I contacted [employer] with [facility] regarding multiple issues with DME and getting supplies. Left message. After compliant with therapy let us know and we can obtain a download for review F/U yearly  Jamie Bible, [facility], [facility], [facility] [address] [place], [place] [address] [phone]

## 2014-08-14 NOTE — Patient Instructions (Signed)
Please try to get different mask for CPAP I will contact the company regarding difficulty working with them After compliant with therapy let us know and we can obtain a download for review F/U yearly

## 2014-08-14 NOTE — Progress Notes (Signed)
I agree with the assessment and plan as directed by NP .The patient is known to me .   Jaylah Goodlow, MD  

## 2014-12-11 ENCOUNTER — Ambulatory Visit
Admission: RE | Admit: 2014-12-11 | Discharge: 2014-12-11 | Disposition: A | Discharge status: Home / Self Care | Payer: BC Managed Care – PPO | Source: Ambulatory Visit | Attending: Family Medicine | Admitting: Family Medicine

## 2014-12-11 ENCOUNTER — Other Ambulatory Visit: Payer: Self-pay | Admitting: Family Medicine

## 2014-12-11 ENCOUNTER — Other Ambulatory Visit (HOSPITAL_COMMUNITY)
Admission: RE | Admit: 2014-12-11 | Discharge: 2014-12-11 | Disposition: A | Discharge status: Home / Self Care | Payer: BC Managed Care – PPO | Source: Ambulatory Visit | Attending: Family Medicine | Admitting: Family Medicine

## 2014-12-11 DIAGNOSIS — Z124 Encounter for screening for malignant neoplasm of cervix: Secondary | ICD-10-CM | POA: Insufficient documentation

## 2014-12-11 DIAGNOSIS — Z1151 Encounter for screening for human papillomavirus (HPV): Secondary | ICD-10-CM | POA: Diagnosis present

## 2014-12-11 DIAGNOSIS — M25512 Pain in left shoulder: Secondary | ICD-10-CM

## 2014-12-13 LAB — CYTOLOGY - PAP

## 2014-12-30 ENCOUNTER — Ambulatory Visit: Payer: BC Managed Care – PPO

## 2015-01-01 ENCOUNTER — Ambulatory Visit: Payer: BC Managed Care – PPO | Attending: Family Medicine

## 2015-01-01 DIAGNOSIS — M25612 Stiffness of left shoulder, not elsewhere classified: Secondary | ICD-10-CM

## 2015-01-01 DIAGNOSIS — R293 Abnormal posture: Secondary | ICD-10-CM

## 2015-01-01 DIAGNOSIS — M25512 Pain in left shoulder: Secondary | ICD-10-CM | POA: Diagnosis present

## 2015-01-01 NOTE — Therapy (Signed)
[facility] [address], [place], [place], [address] Phone: [phone]   Fax:  [phone]  Physical Therapy Evaluation  Patient Details  Name: Jamie Glover MRN: [id number] Date of Birth: [birth year] Referring Provider:  Aretta Nip, MD  Encounter Date: 01/01/2015      PT End of Session - 01/01/15 1143    Visit Number 1   Date for PT Re-Evaluation 02/26/15   PT Start Time 1104   PT Stop Time 1144   PT Time Calculation (min) 40 min   Activity Tolerance Patient tolerated treatment well   Behavior During Therapy [facility] for tasks assessed/performed      Past Medical History  Diagnosis Date  . Hypertension   . OSA (obstructive sleep apnea) 2013  . Arthritis     Past Surgical History  Procedure Laterality Date  . C-sections    . Cholecystectomy, laparoscopic  1996  . Right knee surgery    . Cholecystectomy      There were no vitals filed for this visit.  Visit Diagnosis:  Pain in joint, shoulder region, left - Plan: PT plan of care cert/re-cert  Stiffness of shoulder joint, left - Plan: PT plan of care cert/re-cert  Posture abnormality - Plan: PT plan of care cert/re-cert      Subjective Assessment - 01/01/15 1110    Subjective Pt is a Rt hand dominant female who presents to PT with complaints of Lt shoulder pain that began 4-5 months ago.  She noticed that she was not able to sleep on Lt side or reach behind the back with Lt UE.  No incident or injury.  Pt also reports that Rt shoulder began to hurt 3 months ago.     Diagnostic tests x-ray: Lt shoulder-see report.  [facility] at [facility] joint on the Lt   Patient Stated Goals reach behind the back with Lt UE, reduce pain   Currently in Pain? Yes   Pain Score 1   with IR 8/10 briefly   Pain Location Shoulder   Pain Orientation Left   Pain Type Chronic pain   Pain Onset More than a month ago   Pain Frequency Intermittent   Aggravating Factors  putting on  seat belt, IR behind the back, taking off shirt, reaching overhead   Pain Relieving Factors stopping the aggravating activity, aspirin/Advil   Multiple Pain Sites No            [facility] PT Assessment - 01/01/15 0001    Assessment   Medical Diagnosis Lt shoulder pain (M25.512)   Onset Date/Surgical Date 09/01/14   Hand Dominance Right   Next MD Visit 5 months    Prior Therapy none   Precautions   Precautions None   Restrictions   Weight Bearing Restrictions No   Balance Screen   Has the patient fallen in the past 6 months No   Has the patient had a decrease in activity level because of a fear of falling?  No   Is the patient reluctant to leave their home because of a fear of falling?  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  Pain at end range flexion, abduction and IR   PROM   Overall PROM  Deficits   Overall PROM Comments Lt shoulder flexion 130, all other AROM is full with pain at end range   Strength   Overall Strength Within functional limits for tasks performed   Overall Strength Comments 4+/5 to 5/5 throughout   Palpation   Palpation comment Pt with palpable tenderness over Lt external rotators.  Normal glenohumeral joint mobility without pain                           PT Education -  01/01/15 1138    Education provided Yes   Education Details HEP: yellow theraband ER, AAROM flexion shoulder, posture education   Person(s) Educated Patient   Methods Explanation;Demonstration;Handout   Comprehension Verbalized understanding;Returned demonstration          PT Short Term Goals - 01/01/15 1148    PT SHORT TERM GOAL #1   Title be independent in initial HEP   Time 4   Period Weeks   Status New   PT SHORT TERM GOAL #2   Title report < or = to 5/10 Lt shoulder pain with reaching overhead or taking off shirt   Time 4   Period Weeks   Status New   PT SHORT TERM GOAL #3   Title demonstrate Lt shoulder AROM IR to lacking < or = to 2 inches to improve dressing   Time 4   Period Weeks   Status New           PT Long Term Goals - 01/01/15 1101    PT LONG TERM GOAL #1   Title be independent in advanced HEP   Time 8   Period Weeks   Status New   PT LONG TERM GOAL #2   Title reduce FOTO to < or = to 33% limitation   Time 8   Period Weeks   Status New   PT LONG TERM GOAL #3   Title report < or = to 3/10 Lt shoulder pain with reaching overhead and taking off shirt   Time 8   Period Weeks   Status New   PT LONG TERM GOAL #4   Title report 60% less Lt shoulder pain with fastening bra   Time 8   Period Weeks   Status New   PT LONG TERM GOAL #5   Title report postural modification with work at computer to improve scapular position   Time 8   Period Weeks   Status New               Plan - 01/01/15 1143    Clinical Impression Statement Pt is a Rt hand dominant female who presents to PT with Lt shoulder pain lasting 4 months.  No incident or injury.  Pt demonstrates limited AROM IR and painful end range flexion and abduction.  Pt also demonstrates rounded shoulder posture and palpable tenderness over Lt external rotators. Pt will benefit from skilled PT for ROM, strength and postural endurance exercises and modalities as needed for pain.   Pt will benefit  from skilled therapeutic intervention in order to improve on the following deficits Decreased range of motion;Pain;Decreased activity tolerance   Rehab Potential Good   PT Frequency 2x / week   PT Duration 8 weeks   PT Treatment/Interventions ADLs/Self Care Home Management;Electrical Stimulation;Moist Heat;Ultrasound;Patient/family education;Neuromuscular re-education;Therapeutic exercise;Therapeutic activities;Manual techniques;Passive range of motion   PT Next Visit Plan Scapular strength, IR towel stretch, Rockwood exercises, modalities and manual PRN   Consulted and Agree with Plan of Care Patient         Problem List Patient Active Problem List   Diagnosis Date Noted  . Tinnitus 10/01/2011  . Vitamin D deficiency 10/01/2011  . GERD (gastroesophageal reflux disease) 10/01/2011  . Neck pain 10/01/2011  . HTN (hypertension) 10/01/2011  . Sleep apnea 09/29/2011  . BMI 33.0-33.9,adult 09/29/2011    Katheren Jimmerson, PT 01/01/2015, 12:04 PM  [facility] [address] [place], [place], [address] Phone: [phone]   Fax:  [phone]

## 2015-01-01 NOTE — Patient Instructions (Signed)
Hold all stretches 5-10 seconds and perform 5-10 times, 3 times a day.   Slide right arm up wall, with  PALM FACING THE WALL, by leaning toward wall.    Scapular Retraction: Elbow Flexion (Standing)   With elbows bent to 90, pinch shoulder blades together and rotate arms out, keeping elbows bent. Repeat _10___ times per set. Do _1___ sets per session. Do many____ sessions per day.   Posture - Standing   Good posture is important. Avoid slouching and forward head thrust. Maintain curve in low back and align ears over shoulders, hips over ankles.  Pull your belly button in toward your back bone. Posture Tips DO: - stand tall and erect - keep chin tucked in - keep head and shoulders in alignment - check posture regularly in mirror or large window - pull head back against headrest in car seat;  Change your position often.  Sit with lumbar support. DON'T: - slouch or slump while watching TV or reading - sit, stand or lie in one position  for too long;  Sitting is especially hard on the spine so if you sit at a desk/use the computer, then stand up often! Copyright  VHI. All rights reserved.  Posture - Sitting  Sit upright, head facing forward. Try using a roll to support lower back. Keep shoulders relaxed, and avoid rounded back. Keep hips level with knees. Avoid crossing legs for long periods. Copyright  VHI. All rights reserved.  Chronic neck strain can develop because of poor posture and faulty work habits  Postural strain related to slumped sitting and forward head posture is a leading cause of headaches, neck and upper back pain  General strengthening and flexibility exercises are helpful in the treatment of neck pain.  Most importantly, you should learn to correct the posture that may be contributing to chronic pain.   Change positions frequently  Change your work or home environment to improve posture and mechanics.    [facility] [address] [place], [place] [address] Phone # [phone] Fax [phone]

## 2015-01-08 ENCOUNTER — Ambulatory Visit: Payer: BC Managed Care – PPO

## 2015-01-08 DIAGNOSIS — M25512 Pain in left shoulder: Secondary | ICD-10-CM | POA: Diagnosis not present

## 2015-01-08 DIAGNOSIS — M25612 Stiffness of left shoulder, not elsewhere classified: Secondary | ICD-10-CM

## 2015-01-08 DIAGNOSIS — R293 Abnormal posture: Secondary | ICD-10-CM

## 2015-01-08 NOTE — Therapy (Signed)
[facility] [address] [place], [place], [address] Phone: [phone]   Fax:  [phone]  Physical Therapy Treatment  Patient Details  Name: Jamie Glover MRN: [id number] Date of Birth: [birth year] Referring Provider:  Aretta Nip, MD  Encounter Date: 01/08/2015      PT End of Session - 01/08/15 0843    Visit Number 2   Date for PT Re-Evaluation 02/26/15   PT Start Time 0802   PT Stop Time 0843   PT Time Calculation (min) 41 min   Activity Tolerance Patient tolerated treatment well   Behavior During Therapy [facility] for tasks assessed/performed      Past Medical History  Diagnosis Date  . Hypertension   . OSA (obstructive sleep apnea) 2013  . Arthritis     Past Surgical History  Procedure Laterality Date  . C-sections    . Cholecystectomy, laparoscopic  1996  . Right knee surgery    . Cholecystectomy      There were no vitals filed for this visit.  Visit Diagnosis:  Pain in joint, shoulder region, left  Stiffness of shoulder joint, left  Posture abnormality      Subjective Assessment - 01/08/15 0805    Subjective Pt reports that exercises are going well and she is more aware of her posture   Currently in Pain? Yes   Pain Score 1    Pain Location Shoulder   Pain Orientation Left   Pain Descriptors / Indicators Aching;Sore   Pain Type Chronic pain   Pain Onset More than a month ago   Pain Frequency Intermittent   Aggravating Factors  putting on seat belt, IR behind the back, taking off shirt, reaching overhead   Pain Relieving Factors no performing aggravating activity   Multiple Pain Sites No                         [facility] Adult PT Treatment/Exercise - 01/08/15 0001    Exercises   Exercises Shoulder   Shoulder Exercises: Seated   External Rotation Both;20 reps   Theraband Level (Shoulder External Rotation) Level 1 (Yellow)   Shoulder Exercises: ROM/Strengthening    UBE (Upper Arm Bike) Level 1 x 6 minutes (3/3)  verbal cues for posture   Other ROM/Strengthening Exercises Rockwood 4 ways: 2x10 with yellow band   Shoulder Exercises: Stretch   Internal Rotation Stretch 20 seconds   Wall Stretch - Flexion Other (comment)  10x10"   Modalities   Modalities Ultrasound   Ultrasound   Ultrasound Location Lt lateral and posterior shoulder joint   Ultrasound Parameters 1.2 w/cm2 50% pulsed x6 minutes   Ultrasound Goals Pain                PT Education - 01/08/15 0827    Education Details HEP: rockwood 4 ways with yellow, IR towel stretch   Person(s) Educated Patient   Methods Explanation;Demonstration;Handout   Comprehension Verbalized understanding;Returned demonstration          PT Short Term Goals - 01/08/15 0808    PT SHORT TERM GOAL #1   Title be independent in initial HEP   Time 4   Period Weeks   Status On-going  independent in initial HEP from first session   PT SHORT TERM GOAL #2   Title report < or = to 5/10 Lt shoulder pain with reaching overhead or taking off shirt   Time 4   Period Weeks  Status On-going  no significant change   PT SHORT TERM GOAL #3   Title demonstrate Lt shoulder AROM IR to lacking < or = to 2 inches to improve dressing   Time 4   Period Weeks   Status On-going  no significant change           PT Long Term Goals - 01/01/15 1101    PT LONG TERM GOAL #1   Title be independent in advanced HEP   Time 8   Period Weeks   Status New   PT LONG TERM GOAL #2   Title reduce FOTO to < or = to 33% limitation   Time 8   Period Weeks   Status New   PT LONG TERM GOAL #3   Title report < or = to 3/10 Lt shoulder pain with reaching overhead and taking off shirt   Time 8   Period Weeks   Status New   PT LONG TERM GOAL #4   Title report 60% less Lt shoulder pain with fastening bra   Time 8   Period Weeks   Status New   PT LONG TERM GOAL #5   Title report postural modification with work at  computer to improve scapular position   Time 8   Period Weeks   Status New               Plan - 01/08/15 0810    Clinical Impression Statement Pt with only 1 session after evaluation.  Pt with continued pain with IR and reaching overhead.  Pt continues to demonstrate limited AROM IF and painful end range flexion and abduction.  Pt also demonstrates postural abnormality.  Pt will benefit from skilled PT for ROM, strength and postural endurance exercises and modalties as needed for pain.     Pt will benefit from skilled therapeutic intervention in order to improve on the following deficits Decreased range of motion;Pain;Decreased activity tolerance   Rehab Potential Good   PT Frequency 2x / week   PT Duration 8 weeks   PT Treatment/Interventions ADLs/Self Care Home Management;Electrical Stimulation;Moist Heat;Ultrasound;Patient/family education;Neuromuscular re-education;Therapeutic exercise;Therapeutic activities;Manual techniques;Passive range of motion   PT Next Visit Plan Scapular strength, review IR towel stretch/Rockwood exercises, modalities and manual PRN   Consulted and Agree with Plan of Care Patient        Problem List Patient Active Problem List   Diagnosis Date Noted  . Tinnitus 10/01/2011  . Vitamin D deficiency 10/01/2011  . GERD (gastroesophageal reflux disease) 10/01/2011  . Neck pain 10/01/2011  . HTN (hypertension) 10/01/2011  . Sleep apnea 09/29/2011  . BMI 33.0-33.9,adult 09/29/2011    Bradi Arbuthnot, PT 01/08/2015, 8:46 AM  [facility] [address], [place], [place], [address] Phone: [phone]   Fax:  [phone]

## 2015-01-08 NOTE — Patient Instructions (Signed)
2 sets of 10 each.  Do 1-2x /day   Strengthening: Resisted Internal Rotation   Hold tubing in left hand, elbow at side and forearm out. Rotate forearm in across body. Repeat ____ times per set. Do ____ sets per session. Do ____ sessions per day.  http://orth.exer.us/830   Copyright  VHI. All rights reserved.  Strengthening: Resisted External Rotation   Hold tubing in right hand, elbow at side and forearm across body. Rotate forearm out. Repeat ____ times per set. Do ____ sets per session. Do ____ sessions per day.  http://orth.exer.us/828   Copyright  VHI. All rights reserved.  Strengthening: Resisted Flexion   Hold tubing with left arm at side. Pull forward and up. Move shoulder through pain-free range of motion. Repeat ____ times per set. Do ____ sets per session. Do ____ sessions per day.  http://orth.exer.us/824   Copyright  VHI. All rights reserved.  Strengthening: Resisted Extension   Hold tubing in right hand, arm forward. Pull arm back, elbow straight. Repeat ____ times per set. Do ____ sets per session. Do ____ sessions per day.  http://orth.exer.us/832   Copyright  VHI. All rights reserved.  ROM: Towel Stretch - with Interior Rotation   Pull left arm up behind back by pulling towel up with other arm. Hold _1-___ seconds. Repeat _5-10___ times per set. Do _1___ sets per session. Do _2-3__ sessions per day.  [facility] [address] [place], [place] [address] Phone # [phone] Fax [phone]

## 2015-01-15 ENCOUNTER — Ambulatory Visit: Payer: BC Managed Care – PPO

## 2015-01-15 DIAGNOSIS — M25512 Pain in left shoulder: Secondary | ICD-10-CM

## 2015-01-15 DIAGNOSIS — M25612 Stiffness of left shoulder, not elsewhere classified: Secondary | ICD-10-CM

## 2015-01-15 DIAGNOSIS — R293 Abnormal posture: Secondary | ICD-10-CM

## 2015-01-15 NOTE — Therapy (Signed)
[facility] [address] [place], [place], [address] Phone: [phone]   Fax:  [phone]  Physical Therapy Treatment  Patient Details  Name: Jamie Glover MRN: [id number] Date of Birth: 07-09-1955 Referring Provider:  Aretta Nip, MD  Encounter Date: 01/15/2015      PT End of Session - 01/15/15 0912    Visit Number 3   Date for PT Re-Evaluation 02/26/15   PT Start Time 0804   PT Stop Time 0845   PT Time Calculation (min) 41 min   Activity Tolerance Patient tolerated treatment well   Behavior During Therapy [facility] for tasks assessed/performed      Past Medical History  Diagnosis Date  . Hypertension   . OSA (obstructive sleep apnea) 2013  . Arthritis     Past Surgical History  Procedure Laterality Date  . C-sections    . Cholecystectomy, laparoscopic  1996  . Right knee surgery    . Cholecystectomy      There were no vitals filed for this visit.  Visit Diagnosis:  Pain in joint, shoulder region, left  Stiffness of shoulder joint, left  Posture abnormality      Subjective Assessment - 01/15/15 0803    Subjective Doing well.  IR stretch with towel is painful sometimes.     Diagnostic tests x-ray: Lt shoulder-see report.  [facility] at [facility] joint on the Lt   Currently in Pain? Yes   Pain Score 1    Pain Location Shoulder   Pain Orientation Left   Pain Descriptors / Indicators Aching;Sore   Pain Onset More than a month ago   Pain Frequency Intermittent   Aggravating Factors  IR behdind the back, taking off shirt, reaching overhead   Pain Relieving Factors not performing aggravating activity                         [facility] Adult PT Treatment/Exercise - 01/15/15 0001    Shoulder Exercises: Sidelying   External Rotation Strengthening;Left;20 reps;Weights   External Rotation Weight (lbs) 1   Shoulder Exercises: Standing   Row Strengthening;Both;20 reps;Weights   Row Weight (lbs)  20   Ultrasound   Ultrasound Location Lt lateral shoulder    Ultrasound Parameters 1.2 w/cm2 50% pulsed x 8 minutes   Ultrasound Goals Pain                  PT Short Term Goals - 01/15/15 3790    PT SHORT TERM GOAL #1   Title be independent in initial HEP   Status Achieved   PT SHORT TERM GOAL #2   Title report < or = to 5/10 Lt shoulder pain with reaching overhead or taking off shirt   Time 4   Period Weeks   Status On-going  brief up to 6/10 with this activity           PT Long Term Goals - 01/01/15 1101    PT LONG TERM GOAL #1   Title be independent in advanced HEP   Time 8   Period Weeks   Status New   PT LONG TERM GOAL #2   Title reduce FOTO to < or = to 33% limitation   Time 8   Period Weeks   Status New   PT LONG TERM GOAL #3   Title report < or = to 3/10 Lt shoulder pain with reaching overhead and taking off shirt   Time 8  Period Weeks   Status New   PT LONG TERM GOAL #4   Title report 60% less Lt shoulder pain with fastening bra   Time 8   Period Weeks   Status New   PT LONG TERM GOAL #5   Title report postural modification with work at computer to improve scapular position   Time 8   Period Weeks   Status New               Plan - 01/15/15 0810    Clinical Impression Statement Pt with 1/10 average Lt shoulder pain with daily tasks.  Pt with up to 6/10 brief Lt shouler pain with taking off shirt and IR with putting on bra.  Pt is now doing postural stabilization exercise at the gym and yoga to improve Lt shoulder AROM.  Pt will benfit from skilled PT for Lt shoulder AROM, strength, and modalities for pain.     Pt will benefit from skilled therapeutic intervention in order to improve on the following deficits Decreased range of motion;Pain;Decreased activity tolerance   Rehab Potential Good   PT Frequency 2x / week   PT Duration 8 weeks   PT Treatment/Interventions ADLs/Self Care Home Management;Electrical Stimulation;Moist  Heat;Ultrasound;Patient/family education;Neuromuscular re-education;Therapeutic exercise;Therapeutic activities;Manual techniques;Passive range of motion   PT Next Visit Plan Scapular strength, review IR towel stretch/[place] exercises, modalities and manual PRN. Pt out of town next week   Consulted and Agree with Plan of Care Patient        Problem List Patient Active Problem List   Diagnosis Date Noted  . Tinnitus 10/01/2011  . Vitamin D deficiency 10/01/2011  . GERD (gastroesophageal reflux disease) 10/01/2011  . Neck pain 10/01/2011  . HTN (hypertension) 10/01/2011  . Sleep apnea 09/29/2011  . BMI 33.0-33.9,adult 09/29/2011    Jacai Kipp, PT 01/15/2015, 9:13 AM  [facility] [address], [place], [place], [address] Phone: [phone]   Fax:  [phone]

## 2015-01-29 ENCOUNTER — Encounter: Payer: Self-pay | Admitting: Physical Therapy

## 2015-01-29 ENCOUNTER — Ambulatory Visit: Payer: BC Managed Care – PPO | Admitting: Physical Therapy

## 2015-01-29 DIAGNOSIS — M25512 Pain in left shoulder: Secondary | ICD-10-CM

## 2015-01-29 DIAGNOSIS — R293 Abnormal posture: Secondary | ICD-10-CM

## 2015-01-29 DIAGNOSIS — M25612 Stiffness of left shoulder, not elsewhere classified: Secondary | ICD-10-CM

## 2015-01-29 NOTE — Therapy (Signed)
[facility] [address] [place], [place], [address] Phone: [phone]   Fax:  [phone]  Physical Therapy Treatment  Patient Details  Name: Jamie Glover MRN: [id number] Date of Birth: 1956-02-11 Referring Provider:  Aretta Nip, MD  Encounter Date: 01/29/2015      PT End of Session - 01/29/15 0828    Visit Number 4   Date for PT Re-Evaluation 02/26/15   PT Start Time 0800   PT Stop Time 0845   PT Time Calculation (min) 45 min   Activity Tolerance Patient tolerated treatment well   Behavior During Therapy [facility] for tasks assessed/performed      Past Medical History  Diagnosis Date  . Hypertension   . OSA (obstructive sleep apnea) 2013  . Arthritis     Past Surgical History  Procedure Laterality Date  . C-sections    . Cholecystectomy, laparoscopic  1996  . Right knee surgery    . Cholecystectomy      There were no vitals filed for this visit.  Visit Diagnosis:  Pain in joint, shoulder region, left  Stiffness of shoulder joint, left  Posture abnormality      Subjective Assessment - 01/29/15 0802    Subjective Pain mainly with reaching overhead and reaching behind her.   Currently in Pain? Yes   Pain Score 1    Pain Location Shoulder   Pain Orientation Left   Pain Descriptors / Indicators Sore   Aggravating Factors  certain movements,    Pain Relieving Factors at rest with arm supported   Multiple Pain Sites No            [facility] PT Assessment - 01/29/15 0001    AROM   Overall AROM  --  1 1/2 inch difference bt sides                     [facility] Adult PT Treatment/Exercise - 01/29/15 0001    Shoulder Exercises: Supine   Other Supine Exercises Decompression position on foam roll x 1 min   Protraction/retraction 10x, shld flexion 10x, arm circles 10   Shoulder Exercises: Standing   Row Strengthening;Both;Weights  3x10    Row Weight (lbs) 20   Other Standing  Exercises IR/ER LT 10x   Shoulder Exercises: Pulleys   Flexion 3 minutes   ABduction 3 minutes   Shoulder Exercises: Stretch   Internal Rotation Stretch 60 seconds   Internal Rotation Stretch Limitations towel used   Ultrasound   Ultrasound Location Lt lateral shld   Ultrasound Parameters 1wtcm2 100% 1 MZ   Ultrasound Goals Pain                  PT Short Term Goals - 01/29/15 0849    PT SHORT TERM GOAL #1   Title be independent in initial HEP   Time 4   Period Weeks   Status Achieved   PT SHORT TERM GOAL #2   Title report < or = to 5/10 Lt shoulder pain with reaching overhead or taking off shirt   Time 4   Period Weeks   Status On-going   PT SHORT TERM GOAL #3   Title demonstrate Lt shoulder AROM IR to lacking < or = to 2 inches to improve dressing   Time 4   Period Weeks   Status Achieved           PT Long Term Goals - 01/01/15 1101  PT LONG TERM GOAL #1   Title be independent in advanced HEP   Time 8   Period Weeks   Status New   PT LONG TERM GOAL #2   Title reduce FOTO to < or = to 33% limitation   Time 8   Period Weeks   Status New   PT LONG TERM GOAL #3   Title report < or = to 3/10 Lt shoulder pain with reaching overhead and taking off shirt   Time 8   Period Weeks   Status New   PT LONG TERM GOAL #4   Title report 60% less Lt shoulder pain with fastening bra   Time 8   Period Weeks   Status New   PT LONG TERM GOAL #5   Title report postural modification with work at computer to improve scapular position   Time 8   Period Weeks   Status New               Plan - 01/29/15 0846    Clinical Impression Statement Biggest improvment since last review of goals of her AROM for Lt IR behind the back. She met this goal being only 1 1/2 inches less than her RT. Still "hurts" but she thinks it may not be as bad. Sharp pains are intermittent. Continues to do well with her exercises, added foam roll  AROM exercises today that she could  do  at home or at yoga.   Pt will benefit from skilled therapeutic intervention in order to improve on the following deficits Decreased range of motion;Pain;Decreased activity tolerance   Rehab Potential Good   PT Frequency 2x / week   PT Duration 8 weeks   PT Treatment/Interventions ADLs/Self Care Home Management;Electrical Stimulation;Moist Heat;Ultrasound;Patient/family education;Neuromuscular re-education;Therapeutic exercise;Therapeutic activities;Manual techniques;Passive range of motion   PT Next Visit Plan Scapular stabs, maybe try on foam roll. See if she continued the ROM exercises on foam roll. Continue with [place].   Consulted and Agree with Plan of Care Patient        Problem List Patient Active Problem List   Diagnosis Date Noted  . Tinnitus 10/01/2011  . Vitamin D deficiency 10/01/2011  . GERD (gastroesophageal reflux disease) 10/01/2011  . Neck pain 10/01/2011  . HTN (hypertension) 10/01/2011  . Sleep apnea 09/29/2011  . BMI 33.0-33.9,adult 09/29/2011    Morrigan Wickens, PTA 01/29/2015, 8:50 AM  [facility] [address], [place], [place], [address] Phone: [phone]   Fax:  [phone]

## 2015-02-05 ENCOUNTER — Ambulatory Visit: Payer: BC Managed Care – PPO | Attending: Family Medicine

## 2015-02-05 DIAGNOSIS — R293 Abnormal posture: Secondary | ICD-10-CM | POA: Diagnosis present

## 2015-02-05 DIAGNOSIS — M25512 Pain in left shoulder: Secondary | ICD-10-CM | POA: Diagnosis present

## 2015-02-05 DIAGNOSIS — M25612 Stiffness of left shoulder, not elsewhere classified: Secondary | ICD-10-CM

## 2015-02-05 NOTE — Therapy (Addendum)
[facility] [address], [place], [place], [address] Phone: [phone]   Fax:  [phone]  Physical Therapy Treatment  Patient Details  Name: Jamie Glover MRN: [id number] Date of Birth: 1955/08/12 Referring Provider:  Aretta Nip, MD  Encounter Date: 02/05/2015      PT End of Session - 02/05/15 0840    Visit Number 5   Date for PT Re-Evaluation 02/26/15   PT Start Time 0801   PT Stop Time 0840   PT Time Calculation (min) 39 min   Activity Tolerance Patient tolerated treatment well   Behavior During Therapy [facility] for tasks assessed/performed      Past Medical History  Diagnosis Date  . Hypertension   . OSA (obstructive sleep apnea) 2013  . Arthritis     Past Surgical History  Procedure Laterality Date  . C-sections    . Cholecystectomy, laparoscopic  1996  . Right knee surgery    . Cholecystectomy      There were no vitals filed for this visit.  Visit Diagnosis:  Pain in joint, shoulder region, left  Posture abnormality  Stiffness of shoulder joint, left      Subjective Assessment - 02/05/15 0804    Subjective Pt reports that things feel about the same over the past week or 2.     Currently in Pain? Yes   Pain Score 1   7-8/10 with putting on bra   Pain Location Shoulder   Pain Orientation Left   Pain Descriptors / Indicators Sore   Pain Type Chronic pain   Pain Onset More than a month ago   Pain Frequency Intermittent   Aggravating Factors  reaching behind back to put on bra, reaching overhead   Pain Relieving Factors not performing aggravating activity                         [facility] Adult PT Treatment/Exercise - 02/05/15 0001    Shoulder Exercises: Supine   Horizontal ABduction Strengthening;Both;20 reps;Theraband   Theraband Level (Shoulder Horizontal ABduction) Level 2 (Red)   Other Supine Exercises Decompression position on foam roll x 1 min    Protraction/retraction 10x, shld flexion 10x, arm circles 10   Shoulder Exercises: Standing   Other Standing Exercises wall push-ups 2x10   Shoulder Exercises: Pulleys   Flexion 3 minutes   ABduction 3 minutes   Shoulder Exercises: ROM/Strengthening   UBE (Upper Arm Bike) Level 1 x 6 minutes (3/3)  verbal cues for posture   Modalities   Modalities Ultrasound   Ultrasound   Ultrasound Location Lt lateral and posterior shoulder   Ultrasound Parameters 1.2 w/cm 50% pulsed    Ultrasound Goals Pain                  PT Short Term Goals - 02/05/15 0806    PT SHORT TERM GOAL #2   Title report < or = to 5/10 Lt shoulder pain with reaching overhead or taking off shirt   Time 4   Period Weeks   Status On-going  7-8/10           PT Long Term Goals - 02/05/15 7116    PT LONG TERM GOAL #1   Title be independent in advanced HEP   Time 8   Period Weeks   Status On-going  indepdnent with current HEP   PT LONG TERM GOAL #4   Title report 60% less Lt shoulder  pain with fastening bra   Time 8   Period Weeks   Status On-going  no change   PT LONG TERM GOAL #5   Title report postural modification with work at computer to improve scapular position   Status Achieved               Plan - 02/05/15 0821    Clinical Impression Statement Pt with continued difficulty with reaching overhead and behind the back with fastening bra.  Pt with 7-8/10 Lt shoulder pain with these motions.  Pt with improved AROM into IR.  Pt is doing exercises and will continue to improve scapular strength.     Pt will benefit from skilled therapeutic intervention in order to improve on the following deficits Decreased range of motion;Pain;Decreased activity tolerance   PT Frequency 2x / week   PT Duration 8 weeks   PT Treatment/Interventions ADLs/Self Care Home Management;Electrical Stimulation;Moist Heat;Ultrasound;Patient/family education;Neuromuscular re-education;Therapeutic  exercise;Therapeutic activities;Manual techniques;Passive range of motion   PT Next Visit Plan Continue with [place], scapular stabilization exercises.   Consulted and Agree with Plan of Care Patient        Problem List Patient Active Problem List   Diagnosis Date Noted  . Tinnitus 10/01/2011  . Vitamin D deficiency 10/01/2011  . GERD (gastroesophageal reflux disease) 10/01/2011  . Neck pain 10/01/2011  . HTN (hypertension) 10/01/2011  . Sleep apnea 09/29/2011  . BMI 33.0-33.9,adult 09/29/2011    TAKACS,KELLY, PT 02/05/2015, 8:42 AM PHYSICAL THERAPY DISCHARGE SUMMARY  Visits from Start of Care: 5  Current functional level related to goals / functional outcomes: Pt attended 5 PT sessions and didn't return to PT after 02/05/15.  See above for most current status.     Remaining deficits: Unknown as pt didn't return to PT.   Education / Equipment: HEP Plan: Patient agrees to discharge.  Patient goals were not met. Patient is being discharged due to not returning since the last visit.  ?????   Sigurd Sos, PT 03/24/2015 10:00 AM  [facility] [address] [place], [place], [address] Phone: [phone]   Fax:  [phone]

## 2015-12-24 DIAGNOSIS — E7800 Pure hypercholesterolemia, unspecified: Secondary | ICD-10-CM | POA: Diagnosis not present

## 2015-12-24 DIAGNOSIS — I10 Essential (primary) hypertension: Secondary | ICD-10-CM | POA: Diagnosis not present

## 2015-12-31 ENCOUNTER — Telehealth: Payer: Self-pay

## 2015-12-31 DIAGNOSIS — I10 Essential (primary) hypertension: Secondary | ICD-10-CM | POA: Diagnosis not present

## 2015-12-31 DIAGNOSIS — G4733 Obstructive sleep apnea (adult) (pediatric): Secondary | ICD-10-CM | POA: Diagnosis not present

## 2015-12-31 DIAGNOSIS — E7800 Pure hypercholesterolemia, unspecified: Secondary | ICD-10-CM | POA: Diagnosis not present

## 2015-12-31 DIAGNOSIS — Z0001 Encounter for general adult medical examination with abnormal findings: Secondary | ICD-10-CM | POA: Diagnosis not present

## 2015-12-31 DIAGNOSIS — R310 Gross hematuria: Secondary | ICD-10-CM | POA: Diagnosis not present

## 2015-12-31 NOTE — Telephone Encounter (Signed)
Jamie Glover, new referral on this patient, will she be ov or consult?

## 2015-12-31 NOTE — Telephone Encounter (Signed)
Got it.

## 2015-12-31 NOTE — Telephone Encounter (Signed)
Office visit ( as long as she was referred for sleep apnea). Hoyle Sauer, NP saw her on 2016 for sleep apnea.

## 2016-01-02 ENCOUNTER — Other Ambulatory Visit: Payer: Self-pay | Admitting: Family Medicine

## 2016-01-02 DIAGNOSIS — Z1231 Encounter for screening mammogram for malignant neoplasm of breast: Secondary | ICD-10-CM

## 2016-01-14 ENCOUNTER — Ambulatory Visit
Admission: RE | Admit: 2016-01-14 | Discharge: 2016-01-14 | Disposition: A | Discharge status: Home / Self Care | Payer: BC Managed Care – PPO | Source: Ambulatory Visit | Attending: Family Medicine | Admitting: Family Medicine

## 2016-01-14 DIAGNOSIS — Z1231 Encounter for screening mammogram for malignant neoplasm of breast: Secondary | ICD-10-CM | POA: Diagnosis not present

## 2016-01-14 DIAGNOSIS — L918 Other hypertrophic disorders of the skin: Secondary | ICD-10-CM | POA: Diagnosis not present

## 2016-01-14 DIAGNOSIS — L814 Other melanin hyperpigmentation: Secondary | ICD-10-CM | POA: Diagnosis not present

## 2016-01-14 DIAGNOSIS — L821 Other seborrheic keratosis: Secondary | ICD-10-CM | POA: Diagnosis not present

## 2016-01-14 DIAGNOSIS — D225 Melanocytic nevi of trunk: Secondary | ICD-10-CM | POA: Diagnosis not present

## 2016-01-15 ENCOUNTER — Other Ambulatory Visit: Payer: Self-pay | Admitting: Family Medicine

## 2016-01-15 DIAGNOSIS — R928 Other abnormal and inconclusive findings on diagnostic imaging of breast: Secondary | ICD-10-CM

## 2016-01-21 ENCOUNTER — Ambulatory Visit
Admission: RE | Admit: 2016-01-21 | Discharge: 2016-01-21 | Disposition: A | Discharge status: Home / Self Care | Payer: BC Managed Care – PPO | Source: Ambulatory Visit | Attending: Family Medicine | Admitting: Family Medicine

## 2016-01-21 ENCOUNTER — Other Ambulatory Visit: Payer: Self-pay | Admitting: Family Medicine

## 2016-01-21 ENCOUNTER — Encounter: Payer: Self-pay | Admitting: Neurology

## 2016-01-21 ENCOUNTER — Ambulatory Visit: Payer: BC Managed Care – PPO | Admitting: Neurology

## 2016-01-21 VITALS — BP 122/72 | HR 72 | Resp 20 | Ht 66.5 in | Wt 202.0 lb

## 2016-01-21 DIAGNOSIS — R928 Other abnormal and inconclusive findings on diagnostic imaging of breast: Secondary | ICD-10-CM

## 2016-01-21 DIAGNOSIS — Z9989 Dependence on other enabling machines and devices: Secondary | ICD-10-CM

## 2016-01-21 DIAGNOSIS — G4733 Obstructive sleep apnea (adult) (pediatric): Secondary | ICD-10-CM

## 2016-01-21 DIAGNOSIS — N6489 Other specified disorders of breast: Secondary | ICD-10-CM | POA: Diagnosis not present

## 2016-01-21 DIAGNOSIS — R922 Inconclusive mammogram: Secondary | ICD-10-CM | POA: Diagnosis not present

## 2016-01-21 DIAGNOSIS — E669 Obesity, unspecified: Secondary | ICD-10-CM | POA: Diagnosis not present

## 2016-01-21 DIAGNOSIS — R921 Mammographic calcification found on diagnostic imaging of breast: Secondary | ICD-10-CM | POA: Diagnosis not present

## 2016-01-21 NOTE — Progress Notes (Signed)
SLEEP MEDICINE CLINIC   Provider:  Larey Seat, M D  Referring Provider: Aretta Nip, MD Primary Care Physician:  Aretta Nip, MD  Chief Complaint  Patient presents with  . Follow-up    pt stopped using cpap after losing 45 lbs    HPI:  Jamie Glover is a 59 y.o. female , seen here as a referral/ revisit  from Dr. Radene Ou for a sleep follow up. Last seen 09-2013.  history  HISTORY:This right handed female is a patient of Dr. Laney Pastor [facility].  Her sleep consult is today based on a recent PSG and split titration.  This patient endorsed the Epworth score at 15 points before CPAP and now at the same level after.  She had improved her snoring  and her apneas. She wakes up with some hand pain and numbness  and has mild carpal tunnel.  The patient had 20 AHI and 40 RDI in March, returned for a full titration in April and has been titrated to 9 cm water with 3 cm EPR, resulting AHI for the whole night was 2.  Her oxygen desaturations in the baseline study were  extreme from 99% down to 80 % with central and obstructive events.  She has been using it faithfully, and we reviewed and discussed the downloads from her CPAP dated  11-02-11 .  Her  residual AHI is 1.4 on 30 days and 90 days download. CMS compliant with 6.30 minutes.  She  likes a nasal pillow mask.  Sleep time and bedtime are variable.  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  She is not a regular exerciser,  but goes to yoga one to two times weekly.   Interval history from 01/21/2016, Jamie Glover has since her last visit with me medical inserted effort to lose weight and was told by her husband that her snoring had much improved. She felt that she may not need the CPAP anymore. She discontinued its use but when she saw her primary care physician again she was encouraged to restart its use until she has talked to me, with CPAP prescriber. She has lost 44  pounds so the weight loss is significant and her BMI has shrunk. She has been on a low-fat low-cholesterol diet with plenty of fresh fruits and vegetables a daily exercise regimen had been implemented. Given her weight loss of 44 pounds is possible that she does know longer need her CPAP. She started using it for another 8 days which I appreciate so I have data to look at. The average user time for the 8 days was 6 hours and 31 minutes and the residual AHI was 0.9 the CPAP is set at 9 cm water with 3 cm EPR. What I do not know is whether baseline apnea would be at this point. It is clear that when she uses the CPAP she has a very desirable low apnea index. It may be that her apnea index is not higher when not using CPAP for this reason I will order a screening test basically a home sleep test to verify if apnea is still present or not.  Sleep habits are as follows: The patient usually goes to bed by midnight but often an hour earlier. She has implemented more routines into her wake and sleep cycle. Usually she is asleep promptly but she will wake up twice a night either with the urge to urinate or  has arthritic shoulder pain on her right side. She sleeps mainly in supine position since she has developed arthritis and the pain keeps her from sleeping on the side. She sleeps on one pillow and one to support her shoulder. The bedroom is described as cool, quiet and dark. Most times she will wake up spontaneously in the morning when she has to arise does not need to rely on an alarm. She wakes up more refreshed and restored since her weight loss. She sleeps 6.5 hours each night.  She does not report morning headaches, but she still has occasionally a dry mouth. This can be apnea related but it can also be simply related to medication. Rises 3 days a week at 6.45 AM .    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 Caffeine : 2 diet cokes a day from previously  6 a day. No coffee and no iced  tea. ETOH; None, no tobacco use and any form. She is married, has 4 adult children, the youngest  in college.  Review of Systems: Out of a complete 14 system review, the patient complains of only the following symptoms, and all other reviewed systems are negative.  Epworth score 7 , Fatigue severity score 12  , depression score 0/15     Social History   Social History  . Marital status: Married    Spouse name: N/A  . Number of children: N/A  . Years of education: N/A   Occupational History  . Not on file.   Social History Main Topics  . Smoking status: Never Smoker  . Smokeless tobacco: Never Used  . Alcohol use No  . Drug use: No  . Sexual activity: Not on file   Other Topics Concern  . Not on file   Social History Narrative  . No narrative on file    Family History  Problem Relation Age of Onset  . Hypertension Mother   . Cancer Mother   . Diabetes Mother   . Hypertension Father   . Alzheimer's disease Father     Past Medical History:  Diagnosis Date  . Arthritis   . Hypertension   . OSA (obstructive sleep apnea) 2013    Past Surgical History:  Procedure Laterality Date  . c-sections    . CHOLECYSTECTOMY    . CHOLECYSTECTOMY, LAPAROSCOPIC  1996  . right knee surgery      Current Outpatient Prescriptions  Medication Sig Dispense Refill  . aspirin 325 MG tablet Take 325 mg by mouth daily.    Marland Kitchen atorvastatin (LIPITOR) 10 MG tablet Take 10 mg by mouth daily.    . cholecalciferol (VITAMIN D) 1000 UNITS tablet Take 1,000 Units by mouth daily.    . fish oil-omega-3 fatty acids 1000 MG capsule Take 2 g by mouth daily.    Marland Kitchen lisinopril (PRINIVIL,ZESTRIL) 10 MG tablet Take 10 mg by mouth daily.    . Loratadine-Pseudoephedrine (PX ALLERGY RELIEF D, LORATID, PO) Take by mouth.    . Multiple Vitamin (MULTIVITAMIN) tablet Take 1 tablet by mouth daily.     No current facility-administered medications for this visit.     Allergies as of 01/21/2016 - Review  Complete 01/21/2016  Allergen Reaction Noted  . Other Other (See Comments) 09/29/2011  . Macrodantin [nitrofurantoin macrocrystal] Nausea Only and Rash 09/29/2011    Vitals: BP 122/72   Pulse 72   Resp 20   Ht 5' 6.5" (1.689 m)   Wt 202 lb (  91.6 kg)   LMP 12/01/2010   BMI 32.12 kg/m  Last Weight:  Wt Readings from Last 1 Encounters:  01/21/16 202 lb (91.6 kg)   [id number] mass index is 32.12 kg/m.     Last Height:   Ht Readings from Last 1 Encounters:  01/21/16 5' 6.5" (1.689 m)    Physical exam:  General: The patient is awake, alert and appears not in acute distress. The patient is well groomed. Head: Normocephalic, atraumatic. Neck is supple. Mallampati 3,  neck circumference:15.5 . Nasal airflow patent , Cardiovascular:  Regular rate and rhythm, without  murmurs or carotid bruit, and without distended neck veins. Respiratory: Lungs are clear to auscultation. Skin:  Without evidence of edema, or rash Trunk: BMI is 32  Neurologic exam : The patient is awake and alert, oriented to place and time.   Memory subjective described as intact.  Attention span & concentration ability appears normal.  Speech is fluent,  without dysarthria, dysphonia or aphasia.  Mood and affect are appropriate.  Cranial nerves: Pupils are equal and briskly reactive to light. Hearing to finger rub intact.   Facial sensation intact to fine touch.  Facial motor strength is symmetric and tongue and uvula move midline. Shoulder shrug was symmetrical.   Motor exam: Normal tone, muscle bulk and symmetric strength in all extremities. Sensory:  Fine touch, pinprick and vibration were tested in all extremities. Proprioception tested in the upper extremities was normal. Coordination: No evidence of ataxia, dysmetria or tremor. Deep tendon reflexes: in the  upper and lower extremities are symmetric and intact.    The patient was advised of the nature of the diagnosed sleep disorder , the treatment options  and risks for general a health and wellness arising from not treating the condition.  I spent more than 30 minutes of face to face time with the patient. Greater than 50% of time was spent in counseling and coordination of care. We have discussed the diagnosis and differential and I answered the patient's questions.     Assessment:  After physical and neurologic examination, review of laboratory studies,  Personal review of imaging studies, reports of other /same  Imaging studies ,  Results of polysomnography/ neurophysiology testing and pre-existing records as far as provided in visit., my assessment is   1) I will order a HST ASAP to see if CPAP is truly not longer needed.   2) If AHI 10 or less, will not re initiate sleep treatment by CPAP   3) If AHI is 10 or higher will need to meet and discuss treatment options with CPAP , versus dental device.    Plan:  Treatment plan and additional workup :  HST    Asencion Partridge Kiah Vanalstine MD  01/21/2016   CC: Aretta Nip, [place], [place] [id number]

## 2016-03-13 DIAGNOSIS — Z23 Encounter for immunization: Secondary | ICD-10-CM | POA: Diagnosis not present

## 2016-03-17 ENCOUNTER — Telehealth: Payer: Self-pay

## 2016-03-17 DIAGNOSIS — G4733 Obstructive sleep apnea (adult) (pediatric): Secondary | ICD-10-CM

## 2016-03-17 NOTE — Telephone Encounter (Signed)
I spoke to Dr. Brett Fairy. She says that if the HST will not be covered by [facility], then we have to do an in-lab study.  Dawn, can you call and explain this and your process of getting sleep studies approved to this pt?

## 2016-03-17 NOTE — Telephone Encounter (Signed)
Pt called Dawn to schedule her HST. Dr. Brett Fairy saw pt on 01/21/2016, and told the pt that an HST would be ordered to see if cpap was still needed, however, the order was never placed.  Pt is insistent that she wants an HST. Dawn explained to her that with her insurance, [employer], this most likely will be denied, and then a peer to peer will be requested with Dr. Brett Fairy, and then most likely will be denied again.  Dawn wants to know how to proceed before she gets involved trying to the HST approved.

## 2016-03-24 ENCOUNTER — Telehealth: Payer: Self-pay | Admitting: Neurology

## 2016-03-24 NOTE — Telephone Encounter (Signed)
Pt called to schedule in home sleep study

## 2016-03-31 DIAGNOSIS — H43813 Vitreous degeneration, bilateral: Secondary | ICD-10-CM | POA: Diagnosis not present

## 2016-03-31 DIAGNOSIS — H52203 Unspecified astigmatism, bilateral: Secondary | ICD-10-CM | POA: Diagnosis not present

## 2016-03-31 DIAGNOSIS — H04123 Dry eye syndrome of bilateral lacrimal glands: Secondary | ICD-10-CM | POA: Diagnosis not present

## 2016-03-31 DIAGNOSIS — H524 Presbyopia: Secondary | ICD-10-CM | POA: Diagnosis not present

## 2016-03-31 DIAGNOSIS — S76319A Strain of muscle, fascia and tendon of the posterior muscle group at thigh level, unspecified thigh, initial encounter: Secondary | ICD-10-CM | POA: Diagnosis not present

## 2016-04-05 ENCOUNTER — Encounter: Payer: Self-pay | Admitting: Physical Therapy

## 2016-04-05 ENCOUNTER — Ambulatory Visit: Payer: BC Managed Care – PPO | Attending: Family Medicine | Admitting: Physical Therapy

## 2016-04-05 DIAGNOSIS — M25561 Pain in right knee: Secondary | ICD-10-CM | POA: Diagnosis not present

## 2016-04-05 DIAGNOSIS — M25661 Stiffness of right knee, not elsewhere classified: Secondary | ICD-10-CM | POA: Diagnosis not present

## 2016-04-05 DIAGNOSIS — M6281 Muscle weakness (generalized): Secondary | ICD-10-CM | POA: Diagnosis not present

## 2016-04-05 NOTE — Therapy (Addendum)
[facility] [address], [place], [place], [address] Phone: [phone]   Fax:  [phone]  Physical Therapy Evaluation  Patient Details  Name: Jamie Glover MRN: [id number] Date of Birth: 09-18-1955 Referring Provider: Milagros Evener, MD  Encounter Date: 04/05/2016      PT End of Session - 04/05/16 1450    Visit Number 1   Number of Visits 6   PT Start Time 0845   PT Stop Time 0940   PT Time Calculation (min) 55 min   Activity Tolerance Patient tolerated treatment well;No increased pain   Behavior During Therapy WFL for tasks assessed/performed      Past Medical History:  Diagnosis Date  . Arthritis   . Hypertension   . OSA (obstructive sleep apnea) 2013    Past Surgical History:  Procedure Laterality Date  . c-sections    . CHOLECYSTECTOMY    . CHOLECYSTECTOMY, LAPAROSCOPIC  1996  . right knee surgery      There were no vitals filed for this visit.       Subjective Assessment - 04/05/16 0849    Subjective Pt reports she noticed after sitting for long period of time she had a lot of popping and grinding in her R knee.  Pt states it has been better since it first started around mid-October.  Pain is intermittent everytime she stands after sitting a while.  She mostly does treadmill, abduction/adduction, and has started doing a stretching machine.  States she had surgery of R knee years ago due to torn meniscus and she has history of OA in that knee.     Pertinent History h/o R knee surgery; HTN   Limitations Sitting;Standing   How long can you sit comfortably? 10-30 minutes, pain when standing after sitting   Patient Stated Goals Better understanding of using gym equipment   Currently in Pain? Yes   Pain Score 5   intermittent, not current   Pain Location Knee   Pain Orientation Right   Pain Descriptors / Indicators Sharp   Pain Type Chronic pain;Acute pain   Pain Onset 1 to 4 weeks ago   Pain Frequency Intermittent   Aggravating Factors  standing after sitting for a long time   Pain Relieving Factors moving   Effect of Pain on Daily Activities cannot sit as long as she would like and has not been going to the gym   Multiple Pain Sites No            [facility] PT Assessment - 04/05/16 0001      Assessment   Medical Diagnosis S76.319 hamstring strain    Referring Provider Milagros Evener, MD   Onset Date/Surgical Date 03/12/16   Hand Dominance Right   Prior Therapy yes  PT for shoulder     Precautions   Precautions None     Restrictions   Weight Bearing Restrictions No     Balance Screen   Has the patient fallen in the past 6 months No   Has the patient had a decrease in activity level because of a fear of falling?  Yes   Is the patient reluctant to leave their home because of a fear of falling?  No     Home Ecologist residence     Prior Function   Level of Independence Independent   Vocation Full time employment  nurse     Cognition   Overall Cognitive Status Within Functional  Limits for tasks assessed     Observation/Other Assessments   Focus on Therapeutic Outcomes (FOTO)  44%     AROM   Right Knee Extension 5   Left Knee Extension 0     PROM   Right/Left Shoulder --   Left Shoulder Extension --   Left Shoulder Flexion --   Left Shoulder ABduction --   Left Shoulder Internal Rotation --   Left Shoulder External Rotation --     Strength   Right Hip Flexion 4/5   Right Hip External Rotation  3+/5   Right Hip Internal Rotation 3+/5   Right Hip ADduction 4/5   Left Hip Flexion 4/5   Right Knee Flexion 4/5   Right Knee Extension 4/5   Left Knee Flexion 4/5     Flexibility   Hamstrings R straight leg muscle length 70 deg     Palpation   Palpation comment trigger points lateral gastroc and lateral hamstring RLE  STM with TPR performed to gastroc and hamstring     Ambulation/Gait   Ambulation/Gait Yes    Ambulation/Gait Assistance 7: Independent   Gait Pattern Within Functional Limits                   [facility] Adult PT Treatment/Exercise - 04/05/16 0001      Exercises   Exercises Knee/Hip     Knee/Hip Exercises: Stretches   Passive Hamstring Stretch Right;Left;2 reps;30 seconds   Gastroc Stretch Right;30 seconds;1 rep     Knee/Hip Exercises: Standing   Knee Flexion AROM;Both;1 set;10 reps  1.5 lb ankle weight   Hip Flexion AROM;Both;1 set;10 reps  1.5 lb   Hip Abduction AROM;Both;1 set;10 reps  1.5 lb     Knee/Hip Exercises: Supine   Bridges 1 set;10 reps  3 sec hold     Manual Therapy   Manual Therapy Soft tissue mobilization  gastroc and hamstring RLE, trigger point release to adhesion                PT Education - 04/05/16 1449    Education provided Yes   Education Details HEP handout   Person(s) Educated Patient   Methods Explanation;Demonstration;Verbal cues;Handout   Comprehension Verbalized understanding;Returned demonstration;Verbal cues required          PT Short Term Goals - 04/05/16 1504      PT SHORT TERM GOAL #1   Title be independent in initial HEP   Time 2   Period Weeks   Status New     PT SHORT TERM GOAL #2   Title report no pain when standing after sitting 30 minutes   Time 2   Period Weeks   Status New           PT Long Term Goals - 04/05/16 1507      PT LONG TERM GOAL #1   Title be independent in advanced HEP   Time 6   Period Weeks   Status New     PT LONG TERM GOAL #2   Title reduce FOTO to < or = to 32% limitation   Time 6   Period Weeks   Status New     PT LONG TERM GOAL #3   Title Report no pain in knee after sitting >60 minutes   Time 6   Period Weeks   Status New     PT LONG TERM GOAL #4   Title Reports feeling confident with all exercises she would like to perform at  the gym   Time 6   Period Weeks   Status New               Plan - 04/05/16 1452    Clinical Impression  Statement Pt presents to clinic with R knee OA and muscle spasms and trigger point along lateral R gastroc and hamstring muscles.  Pt demonstratres LE hip and knee weakness and lack of ROM RLE knee extension.  Pt has paplable instability of the R knee joint and would benefit from skilled PT to help patient strengthen appropriate muscle groups, increase flexibility and ROM in order to ensure maximum function and that she can continue her healthy lifestyle and return to workouts at the gym while staying pain free.   Rehab Potential Good   PT Frequency 1x / week   PT Duration 6 weeks   PT Treatment/Interventions Therapeutic activities;Therapeutic exercise;Neuromuscular re-education;Manual techniques   PT Next Visit Plan progress and add to HEP as tolerated   PT Home Exercise Plan standing hip flexion, abduction, hamstring curl, bridges, seated hamstring stretch, standing calf stretch   Recommended Other Services no   Consulted and Agree with Plan of Care Patient      Patient will benefit from skilled therapeutic intervention in order to improve the following deficits and impairments:  Decreased mobility, Decreased range of motion, Decreased strength, Increased muscle spasms, Increased fascial restricitons, Pain  Visit Diagnosis: Acute pain of right knee - Plan: PT plan of care cert/re-cert  Stiffness of right knee, not elsewhere classified - Plan: PT plan of care cert/re-cert  Muscle weakness (generalized) - Plan: PT plan of care cert/re-cert     Problem List Patient Active Problem List   Diagnosis Date Noted  . Tinnitus 10/01/2011  . Vitamin D deficiency 10/01/2011  . GERD (gastroesophageal reflux disease) 10/01/2011  . Neck pain 10/01/2011  . HTN (hypertension) 10/01/2011  . Sleep apnea 09/29/2011  . BMI 33.0-33.9,adult 09/29/2011    Zannie Cove, PT 04/05/2016, 4:41 PM  [facility] [address], [place],  [place], [address] Phone: [phone]   Fax:  [phone]  Name: Jamie Glover MRN: [id number] Date of Birth: Jun 16, 1955

## 2016-04-05 NOTE — Patient Instructions (Signed)
Hamstring Curl    Lift arms out from sides for balance. Shift weight onto right leg, while bending left knee. Repeat, switching legs. Repeat __10_ times, alternating legs. Do _3__ times per day.  Copyright  VHI. All rights reserved.  Bridge    Lie back, legs bent. Inhale, pressing hips up. Keeping ribs in, lengthen lower back. Exhale, rolling down along spine from top. Repeat __10__ times. Do __3__ sessions per day.  http://pm.exer.[email or web address]   Copyright  VHI. All rights reserved.  ABDUCTION: Standing - Resistance Band (Active)    Stand, feet flat. Against yellow resistance band, lift right leg out to side. Complete __3_ sets of _10__ repetitions.   http://gtsc.exer.[email or web address]   Copyright  VHI. All rights reserved.  Balance, Proprioception: Hip Flexion With Tubing    With tubing attached to ankle of uninvolved leg, swing leg forward. Return. Repeat ___10_ times. Do __3__ sessions per day.  http://cc.exer.us/18   Copyright  VHI. All rights reserved.  Calf Stretch    Place one leg forward, bent, other leg behind and straight. Lean forward keeping back heel flat. Hold _30___ seconds while counting out loud. Repeat with other leg forward. Repeat __1__ times. Do __3__ sessions per day.  http://[email or web address].exer.[email or web address]   Copyright  VHI. All rights reserved.  Chair Sitting    Sit at edge of seat, spine straight, one leg extended. Put a hand on each thigh and bend forward from the hip, keeping spine straight. Allow hand on extended leg to reach toward toes. Support upper body with other arm. Hold _30__ seconds. Repeat _1__ times per session. Do _3__ sessions per day.  Copyright  VHI. All rights reserved.

## 2016-04-12 ENCOUNTER — Ambulatory Visit: Payer: BC Managed Care – PPO | Admitting: Physical Therapy

## 2016-04-12 ENCOUNTER — Encounter: Payer: Self-pay | Admitting: Physical Therapy

## 2016-04-12 DIAGNOSIS — M25561 Pain in right knee: Secondary | ICD-10-CM

## 2016-04-12 DIAGNOSIS — M25661 Stiffness of right knee, not elsewhere classified: Secondary | ICD-10-CM

## 2016-04-12 DIAGNOSIS — M6281 Muscle weakness (generalized): Secondary | ICD-10-CM

## 2016-04-12 NOTE — Patient Instructions (Signed)
CClam    Lie on side, legs bent 90. Open top knee to ceiling, rotating leg outward. Touch toes to ankle of bottom leg. Close knees, rotating leg inward. Maintain hip position. Repeat _20___ times. Repeat on other side. Do __1__ sessions per day.  http://pm.exer.[email or web address]   Copyright  VHI. All rights reserved.

## 2016-04-12 NOTE — Therapy (Signed)
[facility] [address], [place], [place], [address] Phone: [phone]   Fax:  [phone]  Physical Therapy Treatment  Patient Details  Name: Jamie Glover MRN: [id number] Date of Birth: [birth year] Referring Provider: Milagros Evener, MD  Encounter Date: 04/12/2016      PT End of Session - 04/12/16 1018    Visit Number 2   Number of Visits 6   PT Start Time 0931   PT Stop Time 1016   PT Time Calculation (min) 45 min   Activity Tolerance Patient tolerated treatment well   Behavior During Therapy [facility] for tasks assessed/performed      Past Medical History:  Diagnosis Date  . Arthritis   . Hypertension   . OSA (obstructive sleep apnea) 2013    Past Surgical History:  Procedure Laterality Date  . c-sections    . CHOLECYSTECTOMY    . CHOLECYSTECTOMY, LAPAROSCOPIC  1996  . right knee surgery      There were no vitals filed for this visit.      Subjective Assessment - 04/12/16 0935    Subjective Pt states she was able to do the exercises and got ankle weights to use for her HEP.   Pertinent History h/o R knee surgery; HTN   Limitations Sitting;Standing   How long can you sit comfortably? 10-30 minutes, pain when standing after sitting   Patient Stated Goals Better understanding of using gym equipment   Currently in Pain? No/denies                         [facility] Adult PT Treatment/Exercise - 04/12/16 0001      Knee/Hip Exercises: Stretches   Passive Hamstring Stretch Right;2 reps;30 seconds   Gastroc Stretch Right;30 seconds;1 rep     Knee/Hip Exercises: Aerobic   Stationary Bike 4 min - level 1-3     Knee/Hip Exercises: Machines for Strengthening   Cybex Leg Press 30# x 20 reps; 40# x 20 reps - Rt LE     Knee/Hip Exercises: Standing   Knee Flexion AROM;Both;1 set;10 reps  2.5 lb ankle weight   Hip Flexion AROM;Both;1 set;10 reps  cable - 1 plate   Hip Abduction AROM;Both;1  set;10 reps  cable machine - 1 plate   Hip Extension AROM;Right;Left;10 reps  cable machine 1 plate   Wall Squat Limitations 10x mini squat with red ball- had increased knee pain     Knee/Hip Exercises: Sidelying   Clams 20x B sides                PT Education - 04/12/16 1016    Education provided Yes   Education Details educated on new exercises   Person(s) Educated Patient   Methods Explanation;Verbal cues;Handout   Comprehension Verbalized understanding          PT Short Term Goals - 04/12/16 1021      PT SHORT TERM GOAL #1   Title be independent in initial HEP   Time 2   Period Weeks   Status On-going     PT SHORT TERM GOAL #2   Title report no pain when standing after sitting 30 minutes   Time 2   Period Weeks   Status On-going     PT SHORT TERM GOAL #3   Title .Marland Kitchen           PT Long Term Goals - 04/12/16 1022      PT  LONG TERM GOAL #1   Title be independent in advanced HEP   Time 6   Period Weeks   Status On-going     PT LONG TERM GOAL #2   Title reduce FOTO to < or = to 32% limitation   Time 6   Period Weeks   Status On-going     PT LONG TERM GOAL #3   Title Report no pain in knee after sitting >60 minutes   Time 6   Period Weeks   Status On-going     PT LONG TERM GOAL #4   Title Reports feeling confident with all exercises she would like to perform at the gym   Time 6   Period Weeks   Status On-going     PT LONG TERM GOAL #5   Title .Marland Kitchen               Plan - 04/12/16 1024    Clinical Impression Statement Pt demonstrates ability to advance exercises and demonstrates good form with some verbal cues during step ups and single leg standing exercises.  Pt had some knee pain with wall squats and was unable to perform without significant weight shifting to the left.  Pt will cont to benefit from skilled PT to work on strengthening.  She is currently progress well with HEP, strenghtening and stretches and did not need manual  therapy for pain relief today.   Rehab Potential Good   PT Frequency 1x / week   PT Duration 6 weeks   PT Treatment/Interventions Therapeutic activities;Therapeutic exercise;Neuromuscular re-education;Manual techniques   PT Next Visit Plan progress and add to HEP as tolerated, review quadruped exercises and mat exericses with ankle weights   PT Home Exercise Plan standing hip flexion, abduction, hamstring curl, bridges, seated hamstring stretch, standing calf stretch, leg press machine, step ups   Consulted and Agree with Plan of Care Patient      Patient will benefit from skilled therapeutic intervention in order to improve the following deficits and impairments:  Decreased mobility, Decreased range of motion, Decreased strength, Increased muscle spasms, Increased fascial restricitons, Pain  Visit Diagnosis: Acute pain of right knee  Stiffness of right knee, not elsewhere classified  Muscle weakness (generalized)     Problem List Patient Active Problem List   Diagnosis Date Noted  . Tinnitus 10/01/2011  . Vitamin D deficiency 10/01/2011  . GERD (gastroesophageal reflux disease) 10/01/2011  . Neck pain 10/01/2011  . HTN (hypertension) 10/01/2011  . Sleep apnea 09/29/2011  . BMI 33.0-33.9,adult 09/29/2011    Zannie Cove, PT 04/12/2016, 11:11 AM  [facility] [address], [place], [place], [address] Phone: [phone]   Fax:  [phone]  Name: Jamie Glover MRN: [id number] Date of Birth: Jan 03, 1956

## 2016-04-13 ENCOUNTER — Ambulatory Visit (INDEPENDENT_AMBULATORY_CARE_PROVIDER_SITE_OTHER): Payer: BC Managed Care – PPO | Admitting: Neurology

## 2016-04-13 DIAGNOSIS — G4733 Obstructive sleep apnea (adult) (pediatric): Secondary | ICD-10-CM | POA: Diagnosis not present

## 2016-04-26 ENCOUNTER — Encounter: Payer: Self-pay | Admitting: Physical Therapy

## 2016-04-26 ENCOUNTER — Ambulatory Visit: Payer: BC Managed Care – PPO | Admitting: Physical Therapy

## 2016-04-26 ENCOUNTER — Telehealth: Payer: Self-pay

## 2016-04-26 DIAGNOSIS — M25661 Stiffness of right knee, not elsewhere classified: Secondary | ICD-10-CM | POA: Diagnosis not present

## 2016-04-26 DIAGNOSIS — M25561 Pain in right knee: Secondary | ICD-10-CM

## 2016-04-26 DIAGNOSIS — M6281 Muscle weakness (generalized): Secondary | ICD-10-CM

## 2016-04-26 NOTE — Therapy (Signed)
[facility] [address], [place], [place], [address] Phone: [phone]   Fax:  [phone]  Physical Therapy Treatment  Patient Details  Name: Jamie Glover MRN: [id number] Date of Birth: [birth year] Referring Provider: Milagros Evener, MD  Encounter Date: 04/26/2016      PT End of Session - 04/26/16 0829    Visit Number 3   Number of Visits 6   PT Start Time 0805   PT Stop Time 0846   PT Time Calculation (min) 41 min   Activity Tolerance Patient tolerated treatment well   Behavior During Therapy [facility] for tasks assessed/performed      Past Medical History:  Diagnosis Date  . Arthritis   . Hypertension   . OSA (obstructive sleep apnea) 2013    Past Surgical History:  Procedure Laterality Date  . c-sections    . CHOLECYSTECTOMY    . CHOLECYSTECTOMY, LAPAROSCOPIC  1996  . right knee surgery      There were no vitals filed for this visit.      Subjective Assessment - 04/26/16 0854    Subjective Pt states knee is feeling better.  Pt has brought pictures of gym equipment for information on which machines are okay to use.   Currently in Pain? No/denies                         [facility] Adult PT Treatment/Exercise - 04/26/16 0001      Lumbar Exercises: Quadruped   Straight Leg Raise 20 reps   Other Quadruped Lumbar Exercises 2x10 fire hydrants     Knee/Hip Exercises: Stretches   Active Hamstring Stretch Right;2 reps;30 seconds  supine with strap   Quad Stretch 2 reps;30 seconds  prone with strap     Knee/Hip Exercises: Machines for Strengthening   Cybex Knee Extension attempt 1 plate x 5 (painful)   Cybex Knee Flexion 1 plate 3x10 Rt LE   Cybex Leg Press 30# x 10 reps; 50# 2x10 reps - Rt LE     Knee/Hip Exercises: Standing   Forward Step Up Right;2 sets;10 reps;Step Height: 6"     Knee/Hip Exercises: Supine   Bridges 1 set;10 reps  3 sec hold marching LE     Knee/Hip  Exercises: Sidelying   Hip ABduction Strengthening;Right;2 sets;10 reps   Clams 20x B sides red theraband                PT Education - 04/26/16 0839    Education provided Yes   Education Details educated on gym equipment based off pictured patient brought , educated on HEP updates as seen in chart   Person(s) Educated Patient   Methods Explanation;Verbal cues;Handout   Comprehension Verbalized understanding          PT Short Term Goals - 04/26/16 0826      PT SHORT TERM GOAL #1   Title be independent in initial HEP   Time 2   Period Weeks   Status Achieved     PT SHORT TERM GOAL #2   Title report no pain when standing after sitting 30 minutes   Baseline can sit for about an hour before feeling    Time 2   Period Weeks   Status Achieved           PT Long Term Goals - 04/12/16 1022      PT LONG TERM GOAL #1   Title be independent in advanced  HEP   Time 6   Period Weeks   Status On-going     PT LONG TERM GOAL #2   Title reduce FOTO to < or = to 32% limitation   Time 6   Period Weeks   Status On-going     PT LONG TERM GOAL #3   Title Report no pain in knee after sitting >60 minutes   Time 6   Period Weeks   Status On-going     PT LONG TERM GOAL #4   Title Reports feeling confident with all exercises she would like to perform at the gym   Time 6   Period Weeks   Status On-going     PT LONG TERM GOAL #5   Title .Marland Kitchen               Plan - 04/26/16 0829    Clinical Impression Statement Pt has made good progress and is doing well with current HEP.  Pt verbally understands additions to HEP and instructions for gym equipment.  Pt will benefit from skilled PT at this time to review HEP and ensure she is independent with her exercises in order to reduced reccurance of knee pain and reduce risk of future injuries.   Rehab Potential Good   PT Frequency 1x / week   PT Duration 6 weeks   PT Treatment/Interventions Therapeutic activities;Therapeutic  exercise;Neuromuscular re-education;Manual techniques   PT Next Visit Plan review HEP and plan for d/c if pt independent with HEP without issues   PT Home Exercise Plan standing hip flexion, abduction, hamstring curl, bridges, seated hamstring stretch, standing calf stretch, leg press machine, step ups   Consulted and Agree with Plan of Care Patient      Patient will benefit from skilled therapeutic intervention in order to improve the following deficits and impairments:  Decreased mobility, Decreased range of motion, Decreased strength, Increased muscle spasms, Increased fascial restricitons, Pain  Visit Diagnosis: Acute pain of right knee  Stiffness of right knee, not elsewhere classified  Muscle weakness (generalized)     Problem List Patient Active Problem List   Diagnosis Date Noted  . Tinnitus 10/01/2011  . Vitamin D deficiency 10/01/2011  . GERD (gastroesophageal reflux disease) 10/01/2011  . Neck pain 10/01/2011  . HTN (hypertension) 10/01/2011  . Sleep apnea 09/29/2011  . BMI 33.0-33.9,adult 09/29/2011    Zannie Cove, PT 04/26/2016, 9:40 AM  [facility] [address] [place], [place], [address] Phone: [phone]   Fax:  [phone]  Name: Jamie Glover MRN: [id number] Date of Birth: 21-Jun-1955

## 2016-04-26 NOTE — Telephone Encounter (Signed)
I spoke to pt and advised her that per Dr. Brett Fairy, pt does not need to return to using cpap, and I advised her that avoiding supine sleep is likely effective in treating her apnea. Pt says that she owns her cpap machine and would like to donate it. I advised her that she may drop it off at the sleep lab or at [facility] if she prefers. Pt declined a follow up appt with Dr. Brett Fairy at this time. Pt verbalized understanding of results. Pt had no questions at this time but was encouraged to call back if questions arise.

## 2016-04-26 NOTE — Patient Instructions (Signed)
Abduction Lift    Lie on residual limb side. Tighten muscles on outside of hip to raise sound limb ____ inches. Hold ____ seconds. Repeat ____ times. Do ____ sessions per day.  Copyright  VHI. All rights reserved.  Abduction: Clam (Eccentric) - Side-Lying    Lie on side with knees bent. Lift top knee, keeping feet together. Keep trunk steady. Slowly lower for 3-5 seconds. ___ reps per set, ___ sets per day, ___ days per week. Add ___ lbs when you achieve ___ repetitions.  http://ecce.exer.us/65   Copyright  VHI. All rights reserved.  Flexion: Stretch - Quadriceps (Prone)    Position Helper: Place one hand on shin near left ankle. Stabilize buttock to keep hip flat on bed. Motion - Helper presses heel toward buttock slowly. CAUTION: Patient should feel stretch along front of thigh. There should be no pain in knee joint. Hold ___ seconds. Repeat ___ times. Repeat with other leg. Do ___ sessions per day. Variation: Contract method: Resist ___ seconds. (see card G.G.-14)  Copyright  VHI. All rights reserved.  [facility] [address], [place], [place] [address] Phone # [phone] Fax [phone]

## 2016-04-26 NOTE — Procedures (Signed)
PATIENT'S NAME:  Jamie Glover, Jamie Glover DOB:      1956/05/05      MR#:    [id number]     DATE OF RECORDING: 04/13/2016 REFERRING M.D.:  Milagros Evener MD Study Performed:   Baseline Polysomnogram HISTORY: OSA was diagnosed in 11/01/2011, CPAP was started at 9 cm water and compliantly used, Epworth dropped from 15 to 7 points since. Mrs. Gornick has now lost 45 pounds weight and was told by her husband that her snoring had much improved. She felt that she may not need the CPAP anymore. Hx of Arthritis, Hypertension, OSA  The patient endorsed the Epworth Sleepiness Scale at 7 points.  The patient's weight 202 pounds with a height of 66 (inches), resulting in a BMI of 32.2 kg/m2.The patient's neck circumference measured 15.5 inches.  CURRENT MEDICATIONS: Aspirin, Lipitor, Vitamin D, Fish oil, Prinivil, Zestril, Loratadine, Multivitamin   PROCEDURE:  This is a multichannel digital polysomnogram utilizing the Somnostar 11.2 system.  Electrodes and sensors were applied and monitored per AASM Specifications.   EEG, EOG, Chin and Limb EMG, were sampled at 200 Hz.  ECG, Snore and Nasal Pressure, Thermal Airflow, Respiratory Effort, CPAP Flow and Pressure, Oximetry was sampled at 50 Hz. Digital video and audio were recorded.      BASELINE STUDY; Lights Out was at 21:19 and Lights On at 05:09.  Total recording time (TRT) was 470.5 minutes, with a total sleep time (TST) of 395 minutes.   The patient's sleep latency was 31 minutes.  REM latency was 88 minutes.  The sleep efficiency was 84. %.     SLEEP ARCHITECTURE: WASO (Wake after sleep onset) was 43 minutes.  There were 5 minutes in Stage N1, 302.5 minutes Stage N2, 12.5 minutes Stage N3 and 75 minutes in Stage REM.  The percentage of Stage N1 was 1.3%, Stage N2 was 76.6%, Stage N3 was 3.2% and Stage R (REM sleep) was 19.%.   RESPIRATORY ANALYSIS:  There were a total of 41 respiratory events:  13 obstructive apneas, 0 central apneas and 0 mixed apneas with a total of 13  apneas and an apnea index (AI) of 2.0/hr. There were 28 hypopneas with a hypopnea index of 4.3 /hour. The patient also had 5 respiratory event related arousals (RERAs).      The total APNEA/HYPOPNEA INDEX (AHI) was 6.2/hour and the total RESPIRATORY DISTURBANCE INDEX was 7.0 /hr.  29 events occurred in REM sleep and 16 events in NREM. The REM AHI was 23.2 /hour, versus a non-REM AHI of 2.25. The patient spent 206.5 minutes of total sleep time in the supine position and 189 minutes in non-supine. The supine AHI was 11.1 versus a non-supine AHI of 1.0.  OXYGEN SATURATION & C02:  The Wake baseline 02 saturation was 94%, with the lowest being 77%. Time spent below 89% saturation equaled 26 minutes.  PERIODIC LIMB MOVEMENTS:   The patient had a Periodic Limb Movement (PLM) index was 42.4 and the PLM Arousal index was 0.6/hour.    IMPRESSION: Mild sleep apnea with overall AHI of 6.2, REM AHI was 23.2, supine AHI was 11/hr. This form of apnea is treatable with CPAP but also with avoiding supine sleep.   A dental device would treat snoring, but not REM dependent apnea.    RECOMMENDATIONS:  1. Avoid sedative-hypnotics which may worsen sleep apnea, alcohol and tobacco (as applicable). 2. Advise to continue to lose weight, by diet and exercise (BMI 32). 3. Further information regarding OSA may be obtained from  [employer] (www.sleepfoundation.org) or American Sleep Apnea Association (www.sleepapnea.org). 4. A follow up appointment will be scheduled in the Sleep Clinic at [facility]. The referring provider will be notified of the results.      I certify that I have reviewed the entire raw data recording prior to the issuance of this report in accordance with the Standards of Accreditation of the [place] Academy of Sleep Medicine (AASM)      Larey Seat, MD  04-26-2016 Diplomat, American Board of Psychiatry and Neurology  Diplomat, American Board of [place] Director, [employer] [facility] [employer]

## 2016-04-26 NOTE — Telephone Encounter (Signed)
-----   Message from Larey Seat, MD sent at 04/26/2016  8:11 AM EST ----- Mrs Bires would not have to return to CPAP, avoiding supine sleep is likely effective. CD

## 2016-05-10 ENCOUNTER — Ambulatory Visit: Payer: BC Managed Care – PPO | Attending: Family Medicine | Admitting: Physical Therapy

## 2016-05-10 ENCOUNTER — Encounter: Payer: Self-pay | Admitting: Physical Therapy

## 2016-05-10 DIAGNOSIS — M25561 Pain in right knee: Secondary | ICD-10-CM | POA: Diagnosis not present

## 2016-05-10 DIAGNOSIS — M25661 Stiffness of right knee, not elsewhere classified: Secondary | ICD-10-CM | POA: Insufficient documentation

## 2016-05-10 DIAGNOSIS — M6281 Muscle weakness (generalized): Secondary | ICD-10-CM | POA: Diagnosis not present

## 2016-05-10 NOTE — Therapy (Signed)
[facility] [address], [place], [place], [address] Phone: [phone]   Fax:  [phone]  Physical Therapy Treatment  Patient Details  Name: Jamie Glover MRN: [id number] Date of Birth: 1955-06-20 Referring Provider: Milagros Evener, MD  Encounter Date: 05/10/2016      PT End of Session - 05/10/16 0938    Visit Number 4   Number of Visits 6   PT Start Time 0931   PT Stop Time 1012   PT Time Calculation (min) 41 min   Activity Tolerance Patient tolerated treatment well   Behavior During Therapy [facility] for tasks assessed/performed      Past Medical History:  Diagnosis Date  . Arthritis   . Hypertension   . OSA (obstructive sleep apnea) 2013    Past Surgical History:  Procedure Laterality Date  . c-sections    . CHOLECYSTECTOMY    . CHOLECYSTECTOMY, LAPAROSCOPIC  1996  . right knee surgery      There were no vitals filed for this visit.      Subjective Assessment - 05/10/16 0935    Subjective Pt states she was only at the gym 1x last week but was more active with other things.  States she felt comfortable with gym equipment and was able to increase weights on her own.  STates she has also been doing the exercises at home.   Pertinent History h/o R knee surgery; HTN   Limitations Sitting;Standing   How long can you sit comfortably? sat for 1.5 hours and didn't have pain when standing   Currently in Pain? No/denies                         [facility] Adult PT Treatment/Exercise - 05/10/16 0001      Knee/Hip Exercises: Stretches   Active Hamstring Stretch Right;2 reps;30 seconds  supine with strap   Quad Stretch 2 reps;30 seconds  prone with strap   Piriformis Stretch --   Other Knee/Hip Stretches IT band stretch 2x 30 sec supine with strap     Knee/Hip Exercises: Aerobic   Stationary Bike 7 min - level 4     Knee/Hip Exercises: Machines for Strengthening   Cybex Knee Flexion 15#,   3x10 Rt LE   Cybex Leg Press 50# 3x10 reps - Rt LE     Knee/Hip Exercises: Standing   Hip Flexion AROM;Both;10 reps;2 sets   Hip Abduction AROM;Both;10 reps;2 sets   Hip Extension AROM;Right;Left;10 reps;2 sets                  PT Short Term Goals - 04/26/16 0826      PT SHORT TERM GOAL #1   Title be independent in initial HEP   Time 2   Period Weeks   Status Achieved     PT SHORT TERM GOAL #2   Title report no pain when standing after sitting 30 minutes   Baseline can sit for about an hour before feeling    Time 2   Period Weeks   Status Achieved           PT Long Term Goals - 05/10/16 8938      PT LONG TERM GOAL #1   Title be independent in advanced HEP   Time 6   Period Weeks   Status Achieved     PT LONG TERM GOAL #3   Title Report no pain in knee after sitting >60 minutes  Time 6   Period Weeks   Status Achieved     PT LONG TERM GOAL #4   Title Reports feeling confident with all exercises she would like to perform at the gym   Time 6   Period Weeks   Status Achieved               Plan - 05/10/16 1025    Clinical Impression Statement Pt has achieved all of her goals at this time and is independent with advanced HEP.  Her LE strength has improved to 4+/5 for all MMT of Rt LE.  She will be discharted with HEP at this time for maintaining her porgress and continue with reduced strength.     Rehab Potential Good   PT Treatment/Interventions Therapeutic activities;Therapeutic exercise;Neuromuscular re-education;Manual techniques   PT Next Visit Plan d/c this visit with HEP   PT Home Exercise Plan current home exercise program   Consulted and Agree with Plan of Care Patient      Patient will benefit from skilled therapeutic intervention in order to improve the following deficits and impairments:  Decreased mobility, Decreased range of motion, Decreased strength, Increased muscle spasms, Increased fascial restricitons, Pain  Visit  Diagnosis: Acute pain of right knee  Stiffness of right knee, not elsewhere classified  Muscle weakness (generalized)     Problem List Patient Active Problem List   Diagnosis Date Noted  . Tinnitus 10/01/2011  . Vitamin D deficiency 10/01/2011  . GERD (gastroesophageal reflux disease) 10/01/2011  . Neck pain 10/01/2011  . HTN (hypertension) 10/01/2011  . Sleep apnea 09/29/2011  . BMI 33.0-33.9,adult 09/29/2011    Zannie Cove, PT 05/10/2016, 10:33 AM   [facility] [address] [place], [place], [address] Phone: [phone]   Fax:  [phone]  Name: [name] MRN: [id number] Date of Birth: 1955/07/23  PHYSICAL THERAPY DISCHARGE SUMMARY  Visits from Start of Care: 4  Current functional level related to goals / functional outcomes: Achieved goals   Remaining deficits:  See above   Education / Equipment: HEP Plan: Patient agrees to discharge.  Patient goals were met. Patient is being discharged due to meeting the stated rehab goals.  ?????

## 2016-06-28 ENCOUNTER — Other Ambulatory Visit: Payer: Self-pay | Admitting: Family Medicine

## 2016-06-28 DIAGNOSIS — R921 Mammographic calcification found on diagnostic imaging of breast: Secondary | ICD-10-CM

## 2016-07-07 DIAGNOSIS — I10 Essential (primary) hypertension: Secondary | ICD-10-CM | POA: Diagnosis not present

## 2016-07-07 DIAGNOSIS — G4730 Sleep apnea, unspecified: Secondary | ICD-10-CM | POA: Diagnosis not present

## 2016-07-07 DIAGNOSIS — E7800 Pure hypercholesterolemia, unspecified: Secondary | ICD-10-CM | POA: Diagnosis not present

## 2016-07-26 ENCOUNTER — Ambulatory Visit
Admission: RE | Admit: 2016-07-26 | Discharge: 2016-07-26 | Disposition: A | Discharge status: Home / Self Care | Payer: BLUE CROSS/BLUE SHIELD | Source: Ambulatory Visit | Attending: Family Medicine | Admitting: Family Medicine

## 2016-07-26 DIAGNOSIS — R921 Mammographic calcification found on diagnostic imaging of breast: Secondary | ICD-10-CM

## 2017-01-03 DIAGNOSIS — Z1211 Encounter for screening for malignant neoplasm of colon: Secondary | ICD-10-CM | POA: Diagnosis not present

## 2017-01-19 DIAGNOSIS — D2261 Melanocytic nevi of right upper limb, including shoulder: Secondary | ICD-10-CM | POA: Diagnosis not present

## 2017-01-19 DIAGNOSIS — D2372 Other benign neoplasm of skin of left lower limb, including hip: Secondary | ICD-10-CM | POA: Diagnosis not present

## 2017-01-19 DIAGNOSIS — L603 Nail dystrophy: Secondary | ICD-10-CM | POA: Diagnosis not present

## 2017-01-19 DIAGNOSIS — L821 Other seborrheic keratosis: Secondary | ICD-10-CM | POA: Diagnosis not present

## 2017-03-22 DIAGNOSIS — E7800 Pure hypercholesterolemia, unspecified: Secondary | ICD-10-CM | POA: Diagnosis not present

## 2017-03-23 ENCOUNTER — Other Ambulatory Visit: Payer: Self-pay | Admitting: Family Medicine

## 2017-03-23 DIAGNOSIS — E7800 Pure hypercholesterolemia, unspecified: Secondary | ICD-10-CM | POA: Diagnosis not present

## 2017-03-23 DIAGNOSIS — R921 Mammographic calcification found on diagnostic imaging of breast: Secondary | ICD-10-CM

## 2017-03-23 DIAGNOSIS — I10 Essential (primary) hypertension: Secondary | ICD-10-CM | POA: Diagnosis not present

## 2017-03-23 DIAGNOSIS — Z0000 Encounter for general adult medical examination without abnormal findings: Secondary | ICD-10-CM | POA: Diagnosis not present

## 2017-03-23 DIAGNOSIS — Z23 Encounter for immunization: Secondary | ICD-10-CM | POA: Diagnosis not present

## 2017-03-23 DIAGNOSIS — G4733 Obstructive sleep apnea (adult) (pediatric): Secondary | ICD-10-CM | POA: Diagnosis not present

## 2017-03-29 ENCOUNTER — Ambulatory Visit
Admission: RE | Admit: 2017-03-29 | Discharge: 2017-03-29 | Disposition: A | Discharge status: Home / Self Care | Payer: BLUE CROSS/BLUE SHIELD | Source: Ambulatory Visit | Attending: Family Medicine | Admitting: Family Medicine

## 2017-03-29 ENCOUNTER — Other Ambulatory Visit: Payer: Self-pay | Admitting: Family Medicine

## 2017-03-29 DIAGNOSIS — N6313 Unspecified lump in the right breast, lower outer quadrant: Secondary | ICD-10-CM | POA: Diagnosis not present

## 2017-03-29 DIAGNOSIS — R922 Inconclusive mammogram: Secondary | ICD-10-CM | POA: Diagnosis not present

## 2017-03-29 DIAGNOSIS — N6310 Unspecified lump in the right breast, unspecified quadrant: Secondary | ICD-10-CM

## 2017-03-29 DIAGNOSIS — R921 Mammographic calcification found on diagnostic imaging of breast: Secondary | ICD-10-CM

## 2017-04-04 ENCOUNTER — Other Ambulatory Visit: Payer: BLUE CROSS/BLUE SHIELD

## 2017-04-04 DIAGNOSIS — H2513 Age-related nuclear cataract, bilateral: Secondary | ICD-10-CM | POA: Diagnosis not present

## 2017-04-04 DIAGNOSIS — H04123 Dry eye syndrome of bilateral lacrimal glands: Secondary | ICD-10-CM | POA: Diagnosis not present

## 2017-04-04 DIAGNOSIS — H524 Presbyopia: Secondary | ICD-10-CM | POA: Diagnosis not present

## 2017-04-04 DIAGNOSIS — H43813 Vitreous degeneration, bilateral: Secondary | ICD-10-CM | POA: Diagnosis not present

## 2017-04-11 ENCOUNTER — Ambulatory Visit
Admission: RE | Admit: 2017-04-11 | Discharge: 2017-04-11 | Disposition: A | Discharge status: Home / Self Care | Payer: BLUE CROSS/BLUE SHIELD | Source: Ambulatory Visit | Attending: Family Medicine | Admitting: Family Medicine

## 2017-04-11 ENCOUNTER — Other Ambulatory Visit: Payer: Self-pay | Admitting: Family Medicine

## 2017-04-11 DIAGNOSIS — N6310 Unspecified lump in the right breast, unspecified quadrant: Secondary | ICD-10-CM

## 2017-04-11 DIAGNOSIS — D241 Benign neoplasm of right breast: Secondary | ICD-10-CM | POA: Diagnosis not present

## 2017-04-11 DIAGNOSIS — N6313 Unspecified lump in the right breast, lower outer quadrant: Secondary | ICD-10-CM | POA: Diagnosis not present

## 2017-10-03 DIAGNOSIS — E7800 Pure hypercholesterolemia, unspecified: Secondary | ICD-10-CM | POA: Diagnosis not present

## 2017-10-03 DIAGNOSIS — I10 Essential (primary) hypertension: Secondary | ICD-10-CM | POA: Diagnosis not present

## 2017-11-07 DIAGNOSIS — H6693 Otitis media, unspecified, bilateral: Secondary | ICD-10-CM | POA: Diagnosis not present

## 2017-11-07 DIAGNOSIS — M25561 Pain in right knee: Secondary | ICD-10-CM | POA: Diagnosis not present

## 2017-11-23 DIAGNOSIS — H6590 Unspecified nonsuppurative otitis media, unspecified ear: Secondary | ICD-10-CM | POA: Diagnosis not present

## 2017-12-05 ENCOUNTER — Encounter (INDEPENDENT_AMBULATORY_CARE_PROVIDER_SITE_OTHER): Payer: Self-pay | Admitting: Orthopaedic Surgery

## 2017-12-05 ENCOUNTER — Ambulatory Visit (INDEPENDENT_AMBULATORY_CARE_PROVIDER_SITE_OTHER): Payer: Self-pay

## 2017-12-05 ENCOUNTER — Ambulatory Visit (INDEPENDENT_AMBULATORY_CARE_PROVIDER_SITE_OTHER): Payer: BC Managed Care – PPO | Admitting: Orthopaedic Surgery

## 2017-12-05 DIAGNOSIS — G8929 Other chronic pain: Secondary | ICD-10-CM | POA: Diagnosis not present

## 2017-12-05 DIAGNOSIS — M1711 Unilateral primary osteoarthritis, right knee: Secondary | ICD-10-CM | POA: Diagnosis not present

## 2017-12-05 DIAGNOSIS — M25561 Pain in right knee: Secondary | ICD-10-CM | POA: Diagnosis not present

## 2017-12-05 NOTE — Progress Notes (Signed)
Office Visit Note   Patient: Jamie Glover           Date of Birth: 08-Jul-1955           MRN: [id number] Visit Date: 12/05/2017              Requested by: Aretta Nip, [place], [place] [address] PCP: Aretta Nip, MD   Assessment & Plan: Visit Diagnoses:  1. Chronic pain of right knee   2. Unilateral primary osteoarthritis, right knee     Plan: She would like to try hyaluronic acid for her left knee.  I gave her handout on Synvisc 1 I do feel that she is a candidate for trying a conservative approach such as this.  She is to work on quad strengthening exercise in the interim.  We will see her back in about 3 weeks to play Synvisc 1 in the right knee.  All questions concerns were answered and addressed.  Follow-Up Instructions: Return in about 3 weeks (around 12/26/2017).   Orders:  Orders Placed This Encounter  Procedures  . XR Knee 1-2 Views Right   No orders of the defined types were placed in this encounter.     Procedures: No procedures performed   Clinical Data: No additional findings.   Subjective: Chief Complaint  Patient presents with  . Right Knee - Pain  Patient is a very pleasant 62 year old female with right knee pain for many years now.  Is gotten much worse over the last year to year and a half with a lot of popping and locking the knee.  If she is sitting very long visit to a standing position she gets a lot of pain in her knee.  Crossing her knee causes pain as well.  Between 1819 years ago she had a right knee arthroscopy with a partial lateral meniscectomy.  She is a patient who does enjoy yoga but does not anymore now due to the severity of her right knee pain.  HPI  Review of Systems She currently denies any systemic illnesses or active medical issues.  Objective: Vital Signs: LMP 12/01/2010   Physical Exam She is alert and oriented x3 and in no acute distress Ortho Exam Examination of her right knee shows  a significant valgus deformity.  She has some mild patellofemoral crepitation with full range of motion of the knee and it feels loosely stable.  There is significant lateral joint line tenderness as well. Specialty Comments:  No specialty comments available.  Imaging: Xr Knee 1-2 Views Right  Result Date: 12/05/2017 2 views of the right knee show tricompartmental arthritic changes.  There is valgus malalignment with near complete loss of the lateral joint space.    PMFS History: Patient Active Problem List   Diagnosis Date Noted  . Tinnitus 10/01/2011  . Vitamin D deficiency 10/01/2011  . GERD (gastroesophageal reflux disease) 10/01/2011  . Neck pain 10/01/2011  . HTN (hypertension) 10/01/2011  . Sleep apnea 09/29/2011  . BMI 33.0-33.9,adult 09/29/2011   Past Medical History:  Diagnosis Date  . Arthritis   . Hypertension   . OSA (obstructive sleep apnea) 2013    Family History  Problem Relation Age of Onset  . Hypertension Mother   . Cancer Mother   . Diabetes Mother   . Hypertension Father   . Alzheimer's disease Father     Past Surgical History:  Procedure Laterality Date  . c-sections    . CHOLECYSTECTOMY    .  CHOLECYSTECTOMY, LAPAROSCOPIC  1996  . right knee surgery     Social History   Occupational History  . Not on file  Tobacco Use  . Smoking status: Never Smoker  . Smokeless tobacco: Never Used  Substance and Sexual Activity  . Alcohol use: No  . Drug use: No  . Sexual activity: Not on file

## 2017-12-06 ENCOUNTER — Telehealth (INDEPENDENT_AMBULATORY_CARE_PROVIDER_SITE_OTHER): Payer: Self-pay

## 2017-12-06 NOTE — Telephone Encounter (Signed)
Synvisc One right knee

## 2017-12-07 NOTE — Telephone Encounter (Signed)
Noted  

## 2017-12-08 ENCOUNTER — Telehealth (INDEPENDENT_AMBULATORY_CARE_PROVIDER_SITE_OTHER): Payer: Self-pay

## 2017-12-08 NOTE — Telephone Encounter (Signed)
Submitted application online for SynviscOne, right knee. 

## 2017-12-19 ENCOUNTER — Telehealth (INDEPENDENT_AMBULATORY_CARE_PROVIDER_SITE_OTHER): Payer: Self-pay

## 2017-12-19 DIAGNOSIS — H90A32 Mixed conductive and sensorineural hearing loss, unilateral, left ear with restricted hearing on the contralateral side: Secondary | ICD-10-CM | POA: Diagnosis not present

## 2017-12-19 DIAGNOSIS — H6992 Unspecified Eustachian tube disorder, left ear: Secondary | ICD-10-CM | POA: Insufficient documentation

## 2017-12-19 DIAGNOSIS — H90A21 Sensorineural hearing loss, unilateral, right ear, with restricted hearing on the contralateral side: Secondary | ICD-10-CM | POA: Diagnosis not present

## 2017-12-19 DIAGNOSIS — H6522 Chronic serous otitis media, left ear: Secondary | ICD-10-CM | POA: Insufficient documentation

## 2017-12-19 DIAGNOSIS — H6982 Other specified disorders of Eustachian tube, left ear: Secondary | ICD-10-CM | POA: Insufficient documentation

## 2017-12-19 NOTE — Telephone Encounter (Signed)
PA required.  Faxed PA form to [employer] at [phone] for SynviscOne, right knee.

## 2017-12-23 ENCOUNTER — Telehealth (INDEPENDENT_AMBULATORY_CARE_PROVIDER_SITE_OTHER): Payer: Self-pay

## 2017-12-23 NOTE — Telephone Encounter (Signed)
Patient approved for SynviscOne injection, right knee. [employer] Covered at 100% after $50.00 Co-pay PA approved-Approval#[id number] Valid 12/19/2017- 12/19/2018  Appt. Scheduled 12/26/2017.

## 2017-12-26 ENCOUNTER — Ambulatory Visit (INDEPENDENT_AMBULATORY_CARE_PROVIDER_SITE_OTHER): Payer: BLUE CROSS/BLUE SHIELD | Admitting: Orthopaedic Surgery

## 2017-12-26 ENCOUNTER — Encounter (INDEPENDENT_AMBULATORY_CARE_PROVIDER_SITE_OTHER): Payer: Self-pay | Admitting: Orthopaedic Surgery

## 2017-12-26 DIAGNOSIS — M1711 Unilateral primary osteoarthritis, right knee: Secondary | ICD-10-CM | POA: Diagnosis not present

## 2017-12-26 MED ORDER — HYLAN G-F 20 48 MG/6ML IX SOSY
48.0000 mg | PREFILLED_SYRINGE | INTRA_ARTICULAR | Status: AC | PRN
  Administered 2017-12-26: 48 mg via INTRA_ARTICULAR

## 2017-12-26 NOTE — Progress Notes (Signed)
   Procedure Note  Patient: Jamie Glover             Date of Birth: August 25, 1955           MRN: [id number]             Visit Date: 12/26/2017  Procedures: Visit Diagnoses: Unilateral primary osteoarthritis, right knee  Large Joint Inj: R knee on 12/26/2017 8:54 AM Indications: pain and diagnostic evaluation Details: 22 G 1.5 in needle, superolateral approach  Arthrogram: No  Medications: 48 mg Hylan 48 MG/6ML Outcome: tolerated well, no immediate complications Procedure, treatment alternatives, risks and benefits explained, specific risks discussed. Consent was given by the patient. Immediately prior to procedure a time out was called to verify the correct patient, procedure, equipment, support staff and site/side marked as required. Patient was prepped and draped in the usual sterile fashion.    The patient is here today for a hyaluronic acid injection in the right knee with Synvisc 1 to treat the pain from osteoarthritis in her knee.  This is been well documented.  She has had a steroid injection as well.  Her pain is almost on a daily basis with activities but she is able to push through it easily.  This is the next step for trying to treat the arthritis pain in her knee.  She understands this fully and did request this injection as well.  All the risk and benefits of this have been explained to her in detail.  On examination her right knee shows no significant effusion with excellent range of motion and mainly medial joint line tenderness.  She tolerated the Synvisc 1 injection well in her right knee.  All question concerns were answered and addressed.  Follow-up will be as needed.

## 2018-01-11 DIAGNOSIS — N390 Urinary tract infection, site not specified: Secondary | ICD-10-CM | POA: Diagnosis not present

## 2018-01-11 DIAGNOSIS — N12 Tubulo-interstitial nephritis, not specified as acute or chronic: Secondary | ICD-10-CM | POA: Diagnosis not present

## 2018-01-14 DIAGNOSIS — N12 Tubulo-interstitial nephritis, not specified as acute or chronic: Secondary | ICD-10-CM | POA: Diagnosis not present

## 2018-02-09 DIAGNOSIS — H6982 Other specified disorders of Eustachian tube, left ear: Secondary | ICD-10-CM | POA: Diagnosis not present

## 2018-02-09 DIAGNOSIS — H6522 Chronic serous otitis media, left ear: Secondary | ICD-10-CM | POA: Diagnosis not present

## 2018-02-09 DIAGNOSIS — H90A21 Sensorineural hearing loss, unilateral, right ear, with restricted hearing on the contralateral side: Secondary | ICD-10-CM | POA: Diagnosis not present

## 2018-02-09 DIAGNOSIS — H90A32 Mixed conductive and sensorineural hearing loss, unilateral, left ear with restricted hearing on the contralateral side: Secondary | ICD-10-CM | POA: Diagnosis not present

## 2018-02-28 ENCOUNTER — Other Ambulatory Visit: Payer: Self-pay | Admitting: Family Medicine

## 2018-02-28 DIAGNOSIS — Z1231 Encounter for screening mammogram for malignant neoplasm of breast: Secondary | ICD-10-CM

## 2018-03-07 DIAGNOSIS — H6522 Chronic serous otitis media, left ear: Secondary | ICD-10-CM | POA: Diagnosis not present

## 2018-03-07 DIAGNOSIS — H90A32 Mixed conductive and sensorineural hearing loss, unilateral, left ear with restricted hearing on the contralateral side: Secondary | ICD-10-CM | POA: Diagnosis not present

## 2018-03-07 DIAGNOSIS — H6982 Other specified disorders of Eustachian tube, left ear: Secondary | ICD-10-CM | POA: Diagnosis not present

## 2018-04-17 ENCOUNTER — Ambulatory Visit (INDEPENDENT_AMBULATORY_CARE_PROVIDER_SITE_OTHER): Payer: BLUE CROSS/BLUE SHIELD | Admitting: Orthopaedic Surgery

## 2018-04-17 ENCOUNTER — Encounter (INDEPENDENT_AMBULATORY_CARE_PROVIDER_SITE_OTHER): Payer: Self-pay | Admitting: Orthopaedic Surgery

## 2018-04-17 DIAGNOSIS — M1711 Unilateral primary osteoarthritis, right knee: Secondary | ICD-10-CM | POA: Diagnosis not present

## 2018-04-17 MED ORDER — METHYLPREDNISOLONE ACETATE 40 MG/ML IJ SUSP
40.0000 mg | INTRAMUSCULAR | Status: AC | PRN
  Administered 2018-04-17: 40 mg via INTRA_ARTICULAR

## 2018-04-17 MED ORDER — LIDOCAINE HCL 1 % IJ SOLN
3.0000 mL | INTRAMUSCULAR | Status: AC | PRN
  Administered 2018-04-17: 3 mL

## 2018-04-17 NOTE — Progress Notes (Signed)
Office Visit Note   Patient: Jamie Glover           Date of Birth: 1956/01/01           MRN: [id number] Visit Date: 04/17/2018              Requested by: Jamie Glover, [place],  [address] PCP: Jamie Nip, MD   Assessment & Plan: Visit Diagnoses:  1. Unilateral primary osteoarthritis, right knee     Plan: She totally understands that this is worsening of her arthritis and likely she has chronic meniscal tearing that is getting worse in the posterior lateral aspect of her right knee and the alignment is getting worse as well from valgus malalignment.  I did aspirate 30 cc of serous fluid from the knee today and this gave her some relief.  She is wearing compressive sleeve.  I did place a steroid in the knee as well.  All questions concerns were answered and addressed.  We can always place hyaluronic acid after the first of year and her right knee again.  At this point if her symptoms are worsening though, the only thing I would recommend to be a knee replacement.  She understands that fully as well.  She will let us know.  Follow-up will be as needed otherwise.  Follow-Up Instructions: Return if symptoms worsen or fail to improve.   Orders:  Orders Placed This Encounter  Procedures  . Large Joint Inj   No orders of the defined types were placed in this encounter.     Procedures: Large Joint Inj: R knee on 04/17/2018 10:24 AM Indications: diagnostic evaluation and pain Details: 22 G 1.5 in needle, superolateral approach  Arthrogram: No  Medications: 3 mL lidocaine 1 %; 40 mg methylPREDNISolone acetate 40 MG/ML Outcome: tolerated well, no immediate complications Procedure, treatment alternatives, risks and benefits explained, specific risks discussed. Consent was given by the patient. Immediately prior to procedure a time out was called to verify the correct patient, procedure, equipment, support staff and site/side marked as required.  Patient was prepped and draped in the usual sterile fashion.       Clinical Data: No additional findings.   Subjective: Chief Complaint  Patient presents with  . Right Knee - Follow-up  Patient has known end-stage arthritis of her right knee with valgus malalignment.  We have tried conservative treatment with anti-inflammatories as well as activity modification.  She had arthroscopic interventions on that knee years ago.  She is also had a hyaluronic acid injection with Synvisc just in July of this year.  Right now her knee is swollen and she says this hurting worse.  She was getting out of the car Thursday and just twisted her knee barely and fell pop in her knee and so a lot of pain since then.  She is hoping something can be done about this acutely.  She understands this is most likely the ramifications of the severity of arthritis of her knee getting worse.  She is getting more popping catching in her knee recently that is been getting worse for her.  Even when she stands she can see her knee is in valgus malalignment in terms of the right knee.  HPI  Review of Systems She currently denies any headache, chest pain, shortness of breath, fever, chills, nausea, vomiting  Objective: Vital Signs: LMP 12/01/2010   Physical Exam She is alert and oriented x3 and in no acute distress  Ortho Exam Examination of her right knee shows valgus malalignment.  She does have a moderate effusion comparing the right and left knees.  Once have been her past 90 degrees of flexion she gets a significant pain in the back of her knee mainly at the posterior lateral aspect. Specialty Comments:  No specialty comments available.  Imaging: No results found. Previous x-rays of her right knee and earlier this year show end-stage arthritis with valgus malalignment.  PMFS History: Patient Active Problem List   Diagnosis Date Noted  . Unilateral primary osteoarthritis, right knee 12/26/2017  . Tinnitus  10/01/2011  . Vitamin D deficiency 10/01/2011  . GERD (gastroesophageal reflux disease) 10/01/2011  . Neck pain 10/01/2011  . HTN (hypertension) 10/01/2011  . Sleep apnea 09/29/2011  . BMI 33.0-33.9,adult 09/29/2011   Past Medical History:  Diagnosis Date  . Arthritis   . Hypertension   . OSA (obstructive sleep apnea) 2013    Family History  Problem Relation Age of Onset  . Hypertension Mother   . Cancer Mother   . Diabetes Mother   . Hypertension Father   . Alzheimer's disease Father     Past Surgical History:  Procedure Laterality Date  . c-sections    . CHOLECYSTECTOMY    . CHOLECYSTECTOMY, LAPAROSCOPIC  1996  . right knee surgery     Social History   Occupational History  . Not on file  Tobacco Use  . Smoking status: Never Smoker  . Smokeless tobacco: Never Used  Substance and Sexual Activity  . Alcohol use: No  . Drug use: No  . Sexual activity: Not on file

## 2018-05-01 ENCOUNTER — Ambulatory Visit: Payer: BLUE CROSS/BLUE SHIELD

## 2018-05-10 DIAGNOSIS — L918 Other hypertrophic disorders of the skin: Secondary | ICD-10-CM | POA: Diagnosis not present

## 2018-05-10 DIAGNOSIS — D2272 Melanocytic nevi of left lower limb, including hip: Secondary | ICD-10-CM | POA: Diagnosis not present

## 2018-05-10 DIAGNOSIS — D225 Melanocytic nevi of trunk: Secondary | ICD-10-CM | POA: Diagnosis not present

## 2018-05-10 DIAGNOSIS — L814 Other melanin hyperpigmentation: Secondary | ICD-10-CM | POA: Diagnosis not present

## 2018-06-05 ENCOUNTER — Ambulatory Visit: Payer: BLUE CROSS/BLUE SHIELD

## 2018-06-08 DIAGNOSIS — E7800 Pure hypercholesterolemia, unspecified: Secondary | ICD-10-CM | POA: Diagnosis not present

## 2018-06-12 ENCOUNTER — Other Ambulatory Visit (HOSPITAL_COMMUNITY)
Admission: RE | Admit: 2018-06-12 | Discharge: 2018-06-12 | Disposition: A | Discharge status: Home / Self Care | Payer: BLUE CROSS/BLUE SHIELD | Source: Ambulatory Visit | Attending: Family Medicine | Admitting: Family Medicine

## 2018-06-12 ENCOUNTER — Other Ambulatory Visit: Payer: Self-pay | Admitting: Family Medicine

## 2018-06-12 DIAGNOSIS — I10 Essential (primary) hypertension: Secondary | ICD-10-CM | POA: Diagnosis not present

## 2018-06-12 DIAGNOSIS — M1711 Unilateral primary osteoarthritis, right knee: Secondary | ICD-10-CM | POA: Diagnosis not present

## 2018-06-12 DIAGNOSIS — Z124 Encounter for screening for malignant neoplasm of cervix: Secondary | ICD-10-CM | POA: Diagnosis not present

## 2018-06-12 DIAGNOSIS — E7800 Pure hypercholesterolemia, unspecified: Secondary | ICD-10-CM | POA: Diagnosis not present

## 2018-06-12 DIAGNOSIS — Z0000 Encounter for general adult medical examination without abnormal findings: Secondary | ICD-10-CM | POA: Diagnosis not present

## 2018-06-14 LAB — CYTOLOGY - PAP: Diagnosis: NEGATIVE

## 2018-06-21 DIAGNOSIS — H43813 Vitreous degeneration, bilateral: Secondary | ICD-10-CM | POA: Diagnosis not present

## 2018-06-21 DIAGNOSIS — H52203 Unspecified astigmatism, bilateral: Secondary | ICD-10-CM | POA: Diagnosis not present

## 2018-06-21 DIAGNOSIS — H2513 Age-related nuclear cataract, bilateral: Secondary | ICD-10-CM | POA: Diagnosis not present

## 2018-06-26 ENCOUNTER — Ambulatory Visit
Admission: RE | Admit: 2018-06-26 | Discharge: 2018-06-26 | Disposition: A | Discharge status: Home / Self Care | Payer: BLUE CROSS/BLUE SHIELD | Source: Ambulatory Visit | Attending: Family Medicine | Admitting: Family Medicine

## 2018-06-26 DIAGNOSIS — Z1231 Encounter for screening mammogram for malignant neoplasm of breast: Secondary | ICD-10-CM | POA: Diagnosis not present

## 2018-12-11 DIAGNOSIS — E7800 Pure hypercholesterolemia, unspecified: Secondary | ICD-10-CM | POA: Diagnosis not present

## 2018-12-11 DIAGNOSIS — I10 Essential (primary) hypertension: Secondary | ICD-10-CM | POA: Diagnosis not present

## 2019-01-08 ENCOUNTER — Telehealth: Payer: Self-pay | Admitting: Orthopaedic Surgery

## 2019-01-08 NOTE — Telephone Encounter (Signed)
Patient called and would like to get another Gel injection in her right leg.  She also stated that her left leg is now become problematic and wanted to know if that one could also be authorized for the Gel injection.  CB#[phone].  Thank you.

## 2019-01-09 NOTE — Telephone Encounter (Signed)
Can you advise please  

## 2019-01-10 NOTE — Telephone Encounter (Signed)
Will submit on Monday, 01/15/2019 per patient request after her appointment.

## 2019-01-15 ENCOUNTER — Ambulatory Visit: Payer: Self-pay

## 2019-01-15 ENCOUNTER — Encounter: Payer: Self-pay | Admitting: Physician Assistant

## 2019-01-15 ENCOUNTER — Telehealth: Payer: Self-pay | Admitting: Physician Assistant

## 2019-01-15 ENCOUNTER — Ambulatory Visit (INDEPENDENT_AMBULATORY_CARE_PROVIDER_SITE_OTHER): Payer: BC Managed Care – PPO | Admitting: Physician Assistant

## 2019-01-15 VITALS — Ht 66.0 in | Wt 207.0 lb

## 2019-01-15 DIAGNOSIS — M1712 Unilateral primary osteoarthritis, left knee: Secondary | ICD-10-CM

## 2019-01-15 DIAGNOSIS — M25562 Pain in left knee: Secondary | ICD-10-CM | POA: Diagnosis not present

## 2019-01-15 DIAGNOSIS — G8929 Other chronic pain: Secondary | ICD-10-CM | POA: Diagnosis not present

## 2019-01-15 NOTE — Progress Notes (Signed)
Office Visit Note   Patient: Jamie Glover           Date of Birth: 1955-09-20           MRN: [id number] Visit Date: 01/15/2019              Requested by: Aretta Nip, [place],  [place] [address] PCP: Aretta Nip, MD   Assessment & Plan: Visit Diagnoses:  1. Chronic pain of left knee   2. Primary osteoarthritis of left knee     Plan: Offered her a steroid injection left knee today she defers.  She did rather have a supplemental injection we will try to gain approval for this.  In the interim she will do quad strengthening exercises IT band stretching exercises as discussed with her at length today.  She also will begin taking Aleve 3 tablets twice daily with food.  And pick up to Tum[place] and see if this helps with her knee pain.  Discussed knee friendly exercises with her at length.  Follow-Up Instructions: Return for Supplemental injection.   Orders:  Orders Placed This Encounter  Procedures  . XR KNEE 3 VIEW LEFT   No orders of the defined types were placed in this encounter.     Procedures: No procedures performed   Clinical Data: No additional findings.   Subjective: Chief Complaint  Patient presents with  . Left Knee - Pain    HPI Jamie Glover 63 year old female comes in today for left knee pain that is been ongoing for a few months.  Seen Dr. Ninfa Linden in the past for right knee.  She states both knees are bothering her she is had no new injury to either knee.  Left knee pain is worse lateral aspect of the knee and she notes grinding popping in the knee worse with transitioning from sitting to a standing position.  She has pain in the left knee with stairs.  She is asking about supplemental injections in both knees.  She has had previous supplemental injection in the right knee which helped for some time.  She is taking no NSAIDs at this point time but is taking Tylenol for the pain.  Review of Systems  Constitutional: Negative  for chills and fever.  Musculoskeletal: Positive for arthralgias.     Objective: Vital Signs: Ht 5\' 6"  (1.676 m)   Wt 207 lb (93.9 kg)   LMP 12/01/2010   BMI 33.41 kg/m   Physical Exam Constitutional:      Appearance: She is not ill-appearing or diaphoretic.  Pulmonary:     Effort: Pulmonary effort is normal.  Neurological:     Mental Status: She is alert and oriented to person, place, and time.     Ortho Exam Bilateral knees no instability valgus varus stressing.  No abnormal warmth erythema or effusion of either knee.  She has tenderness along the lateral joint line of the left knee.  Considerable patellofemoral crepitus left knee with passive range of motion.  Specialty Comments:  No specialty comments available.  Imaging: Xr Knee 3 View Left  Result Date: 01/15/2019 Left knee AP lateral and sunrise view: No acute fractures.  Moderate medial compartmental narrowing.  Moderate patellofemoral changes.  Lateral joint lines well-maintained.    PMFS History: Patient Active Problem List   Diagnosis Date Noted  . Primary osteoarthritis of left knee 01/15/2019  . Unilateral primary osteoarthritis, right knee 12/26/2017  . Tinnitus 10/01/2011  . Vitamin D deficiency  10/01/2011  . GERD (gastroesophageal reflux disease) 10/01/2011  . Neck pain 10/01/2011  . HTN (hypertension) 10/01/2011  . Sleep apnea 09/29/2011  . BMI 33.0-33.9,adult 09/29/2011   Past Medical History:  Diagnosis Date  . Arthritis   . Hypertension   . OSA (obstructive sleep apnea) 2013    Family History  Problem Relation Age of Onset  . Hypertension Mother   . Cancer Mother   . Diabetes Mother   . Hypertension Father   . Alzheimer's disease Father     Past Surgical History:  Procedure Laterality Date  . BREAST BIOPSY    . c-sections    . CHOLECYSTECTOMY    . CHOLECYSTECTOMY, LAPAROSCOPIC  1996  . right knee surgery     Social History   Occupational History  . Not on file  Tobacco Use   . Smoking status: Never Smoker  . Smokeless tobacco: Never Used  Substance and Sexual Activity  . Alcohol use: No  . Drug use: No  . Sexual activity: Not on file

## 2019-01-15 NOTE — Telephone Encounter (Signed)
Please precert for visco injections in her left knee. This is Dr.Blackman's patient.

## 2019-01-16 ENCOUNTER — Telehealth: Payer: Self-pay

## 2019-01-16 NOTE — Telephone Encounter (Signed)
Noted  

## 2019-01-16 NOTE — Telephone Encounter (Signed)
Duplicate

## 2019-01-16 NOTE — Telephone Encounter (Signed)
Left knee gel injection ?

## 2019-01-19 ENCOUNTER — Telehealth: Payer: Self-pay

## 2019-01-19 NOTE — Telephone Encounter (Signed)
PA required for SynviscOne, bilateral knee. Faxed completed PA form to [employer] at [phone]. 

## 2019-01-29 ENCOUNTER — Telehealth: Payer: Self-pay

## 2019-01-29 ENCOUNTER — Telehealth: Payer: Self-pay | Admitting: Orthopaedic Surgery

## 2019-01-29 NOTE — Telephone Encounter (Signed)
Talked with patient concerning approval for gel injection.  Appointment on Wednesday, 01/31/2019 with Dr. Ninfa Linden.

## 2019-01-29 NOTE — Telephone Encounter (Signed)
Patient aware that she is approved for gel injection.  Approved for SynviscOne, bilateral knee. [place] Covered at 100% after Co-pay Co-pay of $100.00 required PA required PA Approval# [id number] Valid 01/19/2019- 01/19/2020 Total 96.00 3  Appt. 01/31/2019 with Dr. Ninfa Linden

## 2019-01-29 NOTE — Telephone Encounter (Signed)
Patient called asked if she can get a call back concerning the gel injection. Patient want to know how long does it take to get an approval from the insurance company for the Gel injection ? The number to contact patient is [phone]

## 2019-01-31 ENCOUNTER — Encounter: Payer: Self-pay | Admitting: Orthopaedic Surgery

## 2019-01-31 ENCOUNTER — Ambulatory Visit (INDEPENDENT_AMBULATORY_CARE_PROVIDER_SITE_OTHER): Payer: BC Managed Care – PPO | Admitting: Orthopaedic Surgery

## 2019-01-31 DIAGNOSIS — M1712 Unilateral primary osteoarthritis, left knee: Secondary | ICD-10-CM

## 2019-01-31 DIAGNOSIS — M1711 Unilateral primary osteoarthritis, right knee: Secondary | ICD-10-CM | POA: Diagnosis not present

## 2019-01-31 MED ORDER — LIDOCAINE HCL 1 % IJ SOLN
5.0000 mL | INTRAMUSCULAR | Status: AC | PRN
  Administered 2019-01-31: 5 mL

## 2019-01-31 MED ORDER — HYLAN G-F 20 48 MG/6ML IX SOSY
48.0000 mg | PREFILLED_SYRINGE | INTRA_ARTICULAR | Status: AC | PRN
  Administered 2019-01-31: 48 mg via INTRA_ARTICULAR

## 2019-01-31 NOTE — Progress Notes (Signed)
   Procedure Note  Patient: Jamie Glover             Date of Birth: 07/12/1955           MRN: [id number]             Visit Date: 01/31/2019  Procedures: Visit Diagnoses:  1. Unilateral primary osteoarthritis, right knee     Large Joint Inj: R knee on 01/31/2019 10:10 AM Indications: pain and diagnostic evaluation Details: 22 G 1.5 in needle, superolateral approach  Arthrogram: No  Medications: 48 mg Hylan 48 MG/6ML; 5 mL lidocaine 1 % Outcome: tolerated well, no immediate complications Procedure, treatment alternatives, risks and benefits explained, specific risks discussed. Consent was given by the patient. Immediately prior to procedure a time out was called to verify the correct patient, procedure, equipment, support staff and site/side marked as required. Patient was prepped and draped in the usual sterile fashion.   Large Joint Inj: L knee on 01/31/2019 10:10 AM Indications: pain and diagnostic evaluation Details: 22 G 1.5 in needle, superolateral approach  Arthrogram: No  Medications: 48 mg Hylan 48 MG/6ML; 5 mL lidocaine 1 % Outcome: tolerated well, no immediate complications Procedure, treatment alternatives, risks and benefits explained, specific risks discussed. Consent was given by the patient. Immediately prior to procedure a time out was called to verify the correct patient, procedure, equipment, support staff and site/side marked as required. Patient was prepped and draped in the usual sterile fashion.    The patient comes in today for scheduled hyaluronic acid with Synvisc 1 in both knees to treat the pain from osteoarthritis.  She last had these injections over a year ago.  They have helped.  She is very active 63 years old.  On examination her knee is just a mild effusion of both knees.  There is slight valgus malalignment.  She has good range of motion of both knees.  She understands fully why we are doing injections such as this given the failure of other  conservative treatment measures and given the fact that this is helped.  Her pain from osteoarthritis.  All questions concerns were answered and addressed.  She will work on continued quad strengthening exercises and activity modification as well as weight loss.  She tolerated the injections well today.  Follow-up can be as needed.

## 2019-05-16 DIAGNOSIS — D2271 Melanocytic nevi of right lower limb, including hip: Secondary | ICD-10-CM | POA: Diagnosis not present

## 2019-05-16 DIAGNOSIS — L603 Nail dystrophy: Secondary | ICD-10-CM | POA: Diagnosis not present

## 2019-05-16 DIAGNOSIS — D2372 Other benign neoplasm of skin of left lower limb, including hip: Secondary | ICD-10-CM | POA: Diagnosis not present

## 2019-05-16 DIAGNOSIS — D2272 Melanocytic nevi of left lower limb, including hip: Secondary | ICD-10-CM | POA: Diagnosis not present

## 2019-06-11 ENCOUNTER — Other Ambulatory Visit: Payer: Self-pay | Admitting: Family Medicine

## 2019-06-11 DIAGNOSIS — Z1231 Encounter for screening mammogram for malignant neoplasm of breast: Secondary | ICD-10-CM

## 2019-06-25 DIAGNOSIS — H43813 Vitreous degeneration, bilateral: Secondary | ICD-10-CM | POA: Diagnosis not present

## 2019-06-25 DIAGNOSIS — H524 Presbyopia: Secondary | ICD-10-CM | POA: Diagnosis not present

## 2019-06-25 DIAGNOSIS — H2513 Age-related nuclear cataract, bilateral: Secondary | ICD-10-CM | POA: Diagnosis not present

## 2019-06-25 DIAGNOSIS — I10 Essential (primary) hypertension: Secondary | ICD-10-CM | POA: Diagnosis not present

## 2019-06-25 DIAGNOSIS — E7800 Pure hypercholesterolemia, unspecified: Secondary | ICD-10-CM | POA: Diagnosis not present

## 2019-06-25 DIAGNOSIS — H02831 Dermatochalasis of right upper eyelid: Secondary | ICD-10-CM | POA: Diagnosis not present

## 2019-07-23 ENCOUNTER — Ambulatory Visit
Admission: RE | Admit: 2019-07-23 | Discharge: 2019-07-23 | Disposition: A | Discharge status: Home / Self Care | Payer: BC Managed Care – PPO | Source: Ambulatory Visit | Attending: Family Medicine | Admitting: Family Medicine

## 2019-07-23 ENCOUNTER — Other Ambulatory Visit: Payer: Self-pay

## 2019-07-23 DIAGNOSIS — Z1231 Encounter for screening mammogram for malignant neoplasm of breast: Secondary | ICD-10-CM

## 2019-10-22 DIAGNOSIS — E7800 Pure hypercholesterolemia, unspecified: Secondary | ICD-10-CM | POA: Diagnosis not present

## 2019-10-22 DIAGNOSIS — Z0001 Encounter for general adult medical examination with abnormal findings: Secondary | ICD-10-CM | POA: Diagnosis not present

## 2020-02-18 DIAGNOSIS — K635 Polyp of colon: Secondary | ICD-10-CM | POA: Diagnosis not present

## 2020-02-18 DIAGNOSIS — Z1211 Encounter for screening for malignant neoplasm of colon: Secondary | ICD-10-CM | POA: Diagnosis not present

## 2020-03-04 ENCOUNTER — Telehealth: Payer: Self-pay | Admitting: Orthopaedic Surgery

## 2020-03-04 NOTE — Telephone Encounter (Signed)
Please advise 

## 2020-03-04 NOTE — Telephone Encounter (Signed)
Patient called wanting to get the gel injections in both of her knees. Patient said she would like to get the injections as soon as possible. The number to contact patient is [phone]

## 2020-03-06 ENCOUNTER — Telehealth: Payer: Self-pay

## 2020-03-06 NOTE — Telephone Encounter (Signed)
Noted  

## 2020-03-06 NOTE — Telephone Encounter (Signed)
Submitted VOB, SynviscOne, bilateral knee. 

## 2020-03-07 ENCOUNTER — Telehealth: Payer: Self-pay

## 2020-03-07 NOTE — Telephone Encounter (Signed)
PA required for SynviscOne, bilateral knee. Faxed completed PA form to [facility] at [phone].

## 2020-03-12 ENCOUNTER — Telehealth: Payer: Self-pay

## 2020-03-12 NOTE — Telephone Encounter (Signed)
FYI-  Patient was denied for SynviscOne, bilateral knee due to needing updated office note to show that patient is using less medication and to show that patient has had improvement since last gel injection.  Patient does have an appointment for Thrusday, 03/13/2020.  Thank you

## 2020-03-13 ENCOUNTER — Encounter: Payer: Self-pay | Admitting: Orthopaedic Surgery

## 2020-03-13 ENCOUNTER — Ambulatory Visit: Payer: BC Managed Care – PPO | Admitting: Orthopaedic Surgery

## 2020-03-13 DIAGNOSIS — M25571 Pain in right ankle and joints of right foot: Secondary | ICD-10-CM

## 2020-03-13 DIAGNOSIS — M1711 Unilateral primary osteoarthritis, right knee: Secondary | ICD-10-CM | POA: Diagnosis not present

## 2020-03-13 DIAGNOSIS — M1712 Unilateral primary osteoarthritis, left knee: Secondary | ICD-10-CM | POA: Diagnosis not present

## 2020-03-13 NOTE — Progress Notes (Signed)
Office Visit Note   Patient: Jamie Glover           Date of Birth: [birth year]           MRN: [id number] Visit Date: 03/13/2020              Requested by: Aretta Nip, [place],  [place] [address] PCP: Aretta Nip, MD   Assessment & Plan: Visit Diagnoses:  1. Unilateral primary osteoarthritis, right knee   2. Unilateral primary osteoarthritis, left knee   3. Pain in right ankle and joints of right foot     Plan: Needed fact the patient continues to have pain lateral aspect of the right ankle and has some weakness on exam recommend MRI of the right foot and ankle.   MRI of her right foot and ankle rule out peroneal tendon tears.  In regards to her bilateral knee arthritis we will try to gain approval for Synvisc 1 injections.  She did have good results with these injections definitely found them beneficial in the past.  Follow-Up Instructions: Return for Supplemental injection.   Orders:  No orders of the defined types were placed in this encounter.  No orders of the defined types were placed in this encounter.     Procedures: No procedures performed   Clinical Data: No additional findings.   Subjective: Chief Complaint  Patient presents with  . Left Knee - Pain  . Right Knee - Pain    HPI Jamie Glover 64 year old female well-known to our department service comes in today for bilateral knee pain.  Right knee she has known tricompartmental arthritic changes.  The left knee she has medial and patellofemoral arthritic changes.  She denies any new injury to either knee. Patient also is having right foot and ankle pain that is been ongoing since July 4.  She states she was walking quickly and felt a pop in the lateral aspect of her right ankle and foot region.  She states that the pop was audible.  She could not walk on the foot for several days did not seek any medical treatment.  States the pain waned over time but now is beginning to  become more painful over the lateral aspect of her right ankle in lateral foot.  Review of Systems Negative for fevers chills shortness of breath chest pain  Objective: Vital Signs: LMP 12/01/2010   Physical Exam Constitutional:      Appearance: She is not ill-appearing or diaphoretic.  Pulmonary:     Effort: Pulmonary effort is normal.  Neurological:     Mental Status: She is alert and oriented to person, place, and time.  Psychiatric:        Behavior: Behavior normal.     Ortho Exam Bilateral knees she has full range of motion both knees.  Has significant crepitus of both knees with passive range of motion.  Valgus deformities of both knees.  Tenderness along the lateral joint line of the right knee and the medial and lateral joint line of her left knee.  No instability valgus varus stressing no abnormal warmth erythema about the knee.  Bilateral calf supple nontender.  Bilateral feet: Dorsal pedal pulses are intact.  No rashes skin lesions ulcerations.  Right ankle edema laterally.  She has tenderness just posterior to the right lateral malleolus and down the peroneal tendons to the fifth metatarsal base.  Left foot is nontender along the peroneal tendons.  She is able to  plantarflex the right great toe against resistance without any weakness.  Eversion bilateral feet against resistance reveals slight weakness with eversion of the right foot against resistance which also causes pain.   Specialty Comments:  No specialty comments available.  Imaging: No results found.   PMFS History: Patient Active Problem List   Diagnosis Date Noted  . Primary osteoarthritis of left knee 01/15/2019  . Unilateral primary osteoarthritis, right knee 12/26/2017  . 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 ear 12/19/2017  . Eustachian tube dysfunction, left 12/19/2017  . Chronic serous otitis media of left ear 12/19/2017  . Tinnitus 10/01/2011    . Vitamin D deficiency 10/01/2011  . GERD (gastroesophageal reflux disease) 10/01/2011  . Neck pain 10/01/2011  . HTN (hypertension) 10/01/2011  . Sleep apnea 09/29/2011  . BMI 33.0-33.9,adult 09/29/2011   Past Medical History:  Diagnosis Date  . Arthritis   . Hypertension   . OSA (obstructive sleep apnea) 2013    Family History  Problem Relation Age of Onset  . Hypertension Mother   . Cancer Mother   . Diabetes Mother   . Hypertension Father   . Alzheimer's disease Father     Past Surgical History:  Procedure Laterality Date  . BREAST BIOPSY    . c-sections    . CHOLECYSTECTOMY    . CHOLECYSTECTOMY, LAPAROSCOPIC  1996  . right knee surgery     Social History   Occupational History  . Not on file  Tobacco Use  . Smoking status: Never Smoker  . Smokeless tobacco: Never Used  Substance and Sexual Activity  . Alcohol use: No  . Drug use: No  . Sexual activity: Not on file

## 2020-03-14 ENCOUNTER — Telehealth: Payer: Self-pay

## 2020-03-14 NOTE — Telephone Encounter (Signed)
Submitted new PA form to [facility] for SynviscOne, bilateral knee at [phone]. Faxed updated office note to [phone].

## 2020-03-17 ENCOUNTER — Other Ambulatory Visit: Payer: Self-pay

## 2020-03-17 ENCOUNTER — Telehealth: Payer: Self-pay

## 2020-03-17 DIAGNOSIS — M1711 Unilateral primary osteoarthritis, right knee: Secondary | ICD-10-CM

## 2020-03-17 DIAGNOSIS — M25571 Pain in right ankle and joints of right foot: Secondary | ICD-10-CM

## 2020-03-17 NOTE — Telephone Encounter (Signed)
Noted  

## 2020-03-17 NOTE — Telephone Encounter (Signed)
Resubmitted on Friday, 03/14/2020.

## 2020-03-17 NOTE — Telephone Encounter (Signed)
Please submit for bil knee gel inj's.  

## 2020-04-03 ENCOUNTER — Telehealth: Payer: Self-pay

## 2020-04-03 NOTE — Telephone Encounter (Signed)
Patient is aware that she is approved for gel injection.  Stated that she will call back to schedule with Dr. Ninfa Linden or Artis Delay.  Approved, SynviscOne, bilateral knee. Buy & Bill Covered at 100% after Co-pay Co-pay of $50.00 required PA required PA Approval# [id number] Valid 03/07/2020- 09/04/2020

## 2020-04-07 ENCOUNTER — Other Ambulatory Visit: Payer: BC Managed Care – PPO

## 2020-04-14 ENCOUNTER — Ambulatory Visit: Payer: BC Managed Care – PPO | Admitting: Orthopaedic Surgery

## 2020-04-14 ENCOUNTER — Encounter: Payer: Self-pay | Admitting: Orthopaedic Surgery

## 2020-04-14 VITALS — Ht 66.0 in | Wt 207.0 lb

## 2020-04-14 DIAGNOSIS — M1711 Unilateral primary osteoarthritis, right knee: Secondary | ICD-10-CM

## 2020-04-14 DIAGNOSIS — M1712 Unilateral primary osteoarthritis, left knee: Secondary | ICD-10-CM | POA: Insufficient documentation

## 2020-04-14 MED ORDER — HYLAN G-F 20 48 MG/6ML IX SOSY
48.0000 mg | PREFILLED_SYRINGE | INTRA_ARTICULAR | Status: AC | PRN
  Administered 2020-04-14: 48 mg via INTRA_ARTICULAR

## 2020-04-14 NOTE — Progress Notes (Signed)
   Procedure Note  Patient: Jamie Glover             Date of Birth: [birth year]           MRN: [id number]             Visit Date: 04/14/2020  Procedures: Visit Diagnoses:  1. Unilateral primary osteoarthritis, left knee   2. Unilateral primary osteoarthritis, right knee     Large Joint Inj: R knee on 04/14/2020 3:18 PM Indications: pain and diagnostic evaluation Details: 22 G 1.5 in needle, superolateral approach  Arthrogram: No  Medications: 48 mg Hylan 48 MG/6ML Outcome: tolerated well, no immediate complications Procedure, treatment alternatives, risks and benefits explained, specific risks discussed. Consent was given by the patient. Immediately prior to procedure a time out was called to verify the correct patient, procedure, equipment, support staff and site/side marked as required. Patient was prepped and draped in the usual sterile fashion.   Large Joint Inj: L knee on 04/14/2020 3:18 PM Indications: pain and diagnostic evaluation Details: 22 G 1.5 in needle, superolateral approach  Arthrogram: No  Medications: 48 mg Hylan 48 MG/6ML Outcome: tolerated well, no immediate complications Procedure, treatment alternatives, risks and benefits explained, specific risks discussed. Consent was given by the patient. Immediately prior to procedure a time out was called to verify the correct patient, procedure, equipment, support staff and site/side marked as required. Patient was prepped and draped in the usual sterile fashion.    The patient is here today for scheduled Synvisc 1 injections for both her knees to treat the pain from osteoarthritis.  She last had these about a year ago and this is what is helped her osteoarthritis pain.  She has had no other acute change in her medical status.  Neither knee today has an effusion but both knees have global tenderness.  Both knees are stable ligamentously and have good range of motion.  I did place Synvisc 1 in both knees today without  difficulty.  All question concerns were answered and addressed.  Follow-up is as needed.

## 2020-05-13 DIAGNOSIS — L603 Nail dystrophy: Secondary | ICD-10-CM | POA: Diagnosis not present

## 2020-05-13 DIAGNOSIS — L918 Other hypertrophic disorders of the skin: Secondary | ICD-10-CM | POA: Diagnosis not present

## 2020-05-13 DIAGNOSIS — L814 Other melanin hyperpigmentation: Secondary | ICD-10-CM | POA: Diagnosis not present

## 2020-05-13 DIAGNOSIS — D2372 Other benign neoplasm of skin of left lower limb, including hip: Secondary | ICD-10-CM | POA: Diagnosis not present

## 2020-06-27 ENCOUNTER — Other Ambulatory Visit: Payer: Self-pay | Admitting: Family Medicine

## 2020-06-27 DIAGNOSIS — Z1231 Encounter for screening mammogram for malignant neoplasm of breast: Secondary | ICD-10-CM

## 2020-07-01 DIAGNOSIS — H5203 Hypermetropia, bilateral: Secondary | ICD-10-CM | POA: Diagnosis not present

## 2020-07-01 DIAGNOSIS — H04123 Dry eye syndrome of bilateral lacrimal glands: Secondary | ICD-10-CM | POA: Diagnosis not present

## 2020-07-01 DIAGNOSIS — H2513 Age-related nuclear cataract, bilateral: Secondary | ICD-10-CM | POA: Diagnosis not present

## 2020-07-01 DIAGNOSIS — H43813 Vitreous degeneration, bilateral: Secondary | ICD-10-CM | POA: Diagnosis not present

## 2020-08-05 ENCOUNTER — Other Ambulatory Visit: Payer: Self-pay

## 2020-08-05 ENCOUNTER — Ambulatory Visit
Admission: RE | Admit: 2020-08-05 | Discharge: 2020-08-05 | Disposition: A | Discharge status: Home / Self Care | Payer: PPO | Source: Ambulatory Visit | Attending: Family Medicine | Admitting: Family Medicine

## 2020-08-05 DIAGNOSIS — Z1231 Encounter for screening mammogram for malignant neoplasm of breast: Secondary | ICD-10-CM

## 2020-10-28 DIAGNOSIS — M8589 Other specified disorders of bone density and structure, multiple sites: Secondary | ICD-10-CM | POA: Diagnosis not present

## 2020-10-28 DIAGNOSIS — I10 Essential (primary) hypertension: Secondary | ICD-10-CM | POA: Diagnosis not present

## 2020-10-28 DIAGNOSIS — E7800 Pure hypercholesterolemia, unspecified: Secondary | ICD-10-CM | POA: Diagnosis not present

## 2020-10-28 DIAGNOSIS — E669 Obesity, unspecified: Secondary | ICD-10-CM | POA: Diagnosis not present

## 2020-10-28 DIAGNOSIS — Z0000 Encounter for general adult medical examination without abnormal findings: Secondary | ICD-10-CM | POA: Diagnosis not present

## 2020-10-30 ENCOUNTER — Other Ambulatory Visit: Payer: Self-pay | Admitting: Family Medicine

## 2020-10-30 DIAGNOSIS — E2839 Other primary ovarian failure: Secondary | ICD-10-CM

## 2020-11-04 DIAGNOSIS — H6122 Impacted cerumen, left ear: Secondary | ICD-10-CM | POA: Diagnosis not present

## 2020-11-18 DIAGNOSIS — H903 Sensorineural hearing loss, bilateral: Secondary | ICD-10-CM | POA: Diagnosis not present

## 2020-11-26 DIAGNOSIS — N941 Unspecified dyspareunia: Secondary | ICD-10-CM | POA: Diagnosis not present

## 2020-12-11 ENCOUNTER — Other Ambulatory Visit: Payer: PPO

## 2020-12-25 ENCOUNTER — Other Ambulatory Visit: Payer: PPO

## 2021-01-15 ENCOUNTER — Other Ambulatory Visit: Payer: Self-pay

## 2021-01-15 ENCOUNTER — Ambulatory Visit
Admission: RE | Admit: 2021-01-15 | Discharge: 2021-01-15 | Disposition: A | Discharge status: Home / Self Care | Payer: PPO | Source: Ambulatory Visit | Attending: Family Medicine | Admitting: Family Medicine

## 2021-01-15 DIAGNOSIS — Z780 Asymptomatic menopausal state: Secondary | ICD-10-CM | POA: Diagnosis not present

## 2021-01-15 DIAGNOSIS — E2839 Other primary ovarian failure: Secondary | ICD-10-CM

## 2021-02-17 DIAGNOSIS — M47816 Spondylosis without myelopathy or radiculopathy, lumbar region: Secondary | ICD-10-CM | POA: Diagnosis not present

## 2021-02-17 DIAGNOSIS — M9901 Segmental and somatic dysfunction of cervical region: Secondary | ICD-10-CM | POA: Diagnosis not present

## 2021-02-17 DIAGNOSIS — M9903 Segmental and somatic dysfunction of lumbar region: Secondary | ICD-10-CM | POA: Diagnosis not present

## 2021-02-17 DIAGNOSIS — M530 Cervicocranial syndrome: Secondary | ICD-10-CM | POA: Diagnosis not present

## 2021-02-18 DIAGNOSIS — M9901 Segmental and somatic dysfunction of cervical region: Secondary | ICD-10-CM | POA: Diagnosis not present

## 2021-02-18 DIAGNOSIS — M9903 Segmental and somatic dysfunction of lumbar region: Secondary | ICD-10-CM | POA: Diagnosis not present

## 2021-02-18 DIAGNOSIS — M530 Cervicocranial syndrome: Secondary | ICD-10-CM | POA: Diagnosis not present

## 2021-02-18 DIAGNOSIS — M47816 Spondylosis without myelopathy or radiculopathy, lumbar region: Secondary | ICD-10-CM | POA: Diagnosis not present

## 2021-03-02 DIAGNOSIS — M9903 Segmental and somatic dysfunction of lumbar region: Secondary | ICD-10-CM | POA: Diagnosis not present

## 2021-03-02 DIAGNOSIS — M530 Cervicocranial syndrome: Secondary | ICD-10-CM | POA: Diagnosis not present

## 2021-03-02 DIAGNOSIS — M9901 Segmental and somatic dysfunction of cervical region: Secondary | ICD-10-CM | POA: Diagnosis not present

## 2021-03-02 DIAGNOSIS — M47816 Spondylosis without myelopathy or radiculopathy, lumbar region: Secondary | ICD-10-CM | POA: Diagnosis not present

## 2021-03-03 DIAGNOSIS — H903 Sensorineural hearing loss, bilateral: Secondary | ICD-10-CM | POA: Diagnosis not present

## 2021-03-04 DIAGNOSIS — M9901 Segmental and somatic dysfunction of cervical region: Secondary | ICD-10-CM | POA: Diagnosis not present

## 2021-03-04 DIAGNOSIS — M9903 Segmental and somatic dysfunction of lumbar region: Secondary | ICD-10-CM | POA: Diagnosis not present

## 2021-03-04 DIAGNOSIS — M530 Cervicocranial syndrome: Secondary | ICD-10-CM | POA: Diagnosis not present

## 2021-03-04 DIAGNOSIS — M47816 Spondylosis without myelopathy or radiculopathy, lumbar region: Secondary | ICD-10-CM | POA: Diagnosis not present

## 2021-03-06 DIAGNOSIS — M530 Cervicocranial syndrome: Secondary | ICD-10-CM | POA: Diagnosis not present

## 2021-03-06 DIAGNOSIS — M9901 Segmental and somatic dysfunction of cervical region: Secondary | ICD-10-CM | POA: Diagnosis not present

## 2021-03-06 DIAGNOSIS — M9903 Segmental and somatic dysfunction of lumbar region: Secondary | ICD-10-CM | POA: Diagnosis not present

## 2021-03-06 DIAGNOSIS — M47816 Spondylosis without myelopathy or radiculopathy, lumbar region: Secondary | ICD-10-CM | POA: Diagnosis not present

## 2021-03-09 DIAGNOSIS — M9901 Segmental and somatic dysfunction of cervical region: Secondary | ICD-10-CM | POA: Diagnosis not present

## 2021-03-09 DIAGNOSIS — M47816 Spondylosis without myelopathy or radiculopathy, lumbar region: Secondary | ICD-10-CM | POA: Diagnosis not present

## 2021-03-09 DIAGNOSIS — M9903 Segmental and somatic dysfunction of lumbar region: Secondary | ICD-10-CM | POA: Diagnosis not present

## 2021-03-09 DIAGNOSIS — M530 Cervicocranial syndrome: Secondary | ICD-10-CM | POA: Diagnosis not present

## 2021-03-11 DIAGNOSIS — M47816 Spondylosis without myelopathy or radiculopathy, lumbar region: Secondary | ICD-10-CM | POA: Diagnosis not present

## 2021-03-11 DIAGNOSIS — M9901 Segmental and somatic dysfunction of cervical region: Secondary | ICD-10-CM | POA: Diagnosis not present

## 2021-03-11 DIAGNOSIS — M530 Cervicocranial syndrome: Secondary | ICD-10-CM | POA: Diagnosis not present

## 2021-03-11 DIAGNOSIS — M9903 Segmental and somatic dysfunction of lumbar region: Secondary | ICD-10-CM | POA: Diagnosis not present

## 2021-03-13 DIAGNOSIS — M9901 Segmental and somatic dysfunction of cervical region: Secondary | ICD-10-CM | POA: Diagnosis not present

## 2021-03-13 DIAGNOSIS — M530 Cervicocranial syndrome: Secondary | ICD-10-CM | POA: Diagnosis not present

## 2021-03-13 DIAGNOSIS — M9903 Segmental and somatic dysfunction of lumbar region: Secondary | ICD-10-CM | POA: Diagnosis not present

## 2021-03-13 DIAGNOSIS — M47816 Spondylosis without myelopathy or radiculopathy, lumbar region: Secondary | ICD-10-CM | POA: Diagnosis not present

## 2021-03-16 DIAGNOSIS — M9903 Segmental and somatic dysfunction of lumbar region: Secondary | ICD-10-CM | POA: Diagnosis not present

## 2021-03-16 DIAGNOSIS — M9901 Segmental and somatic dysfunction of cervical region: Secondary | ICD-10-CM | POA: Diagnosis not present

## 2021-03-16 DIAGNOSIS — M530 Cervicocranial syndrome: Secondary | ICD-10-CM | POA: Diagnosis not present

## 2021-03-16 DIAGNOSIS — M47816 Spondylosis without myelopathy or radiculopathy, lumbar region: Secondary | ICD-10-CM | POA: Diagnosis not present

## 2021-03-18 DIAGNOSIS — M47816 Spondylosis without myelopathy or radiculopathy, lumbar region: Secondary | ICD-10-CM | POA: Diagnosis not present

## 2021-03-18 DIAGNOSIS — M9903 Segmental and somatic dysfunction of lumbar region: Secondary | ICD-10-CM | POA: Diagnosis not present

## 2021-03-18 DIAGNOSIS — M530 Cervicocranial syndrome: Secondary | ICD-10-CM | POA: Diagnosis not present

## 2021-03-18 DIAGNOSIS — M9901 Segmental and somatic dysfunction of cervical region: Secondary | ICD-10-CM | POA: Diagnosis not present

## 2021-03-23 DIAGNOSIS — M9901 Segmental and somatic dysfunction of cervical region: Secondary | ICD-10-CM | POA: Diagnosis not present

## 2021-03-23 DIAGNOSIS — M530 Cervicocranial syndrome: Secondary | ICD-10-CM | POA: Diagnosis not present

## 2021-03-23 DIAGNOSIS — M47816 Spondylosis without myelopathy or radiculopathy, lumbar region: Secondary | ICD-10-CM | POA: Diagnosis not present

## 2021-03-23 DIAGNOSIS — M9903 Segmental and somatic dysfunction of lumbar region: Secondary | ICD-10-CM | POA: Diagnosis not present

## 2021-03-24 DIAGNOSIS — M9903 Segmental and somatic dysfunction of lumbar region: Secondary | ICD-10-CM | POA: Diagnosis not present

## 2021-03-24 DIAGNOSIS — M9901 Segmental and somatic dysfunction of cervical region: Secondary | ICD-10-CM | POA: Diagnosis not present

## 2021-03-24 DIAGNOSIS — M530 Cervicocranial syndrome: Secondary | ICD-10-CM | POA: Diagnosis not present

## 2021-03-24 DIAGNOSIS — M47816 Spondylosis without myelopathy or radiculopathy, lumbar region: Secondary | ICD-10-CM | POA: Diagnosis not present

## 2021-05-12 DIAGNOSIS — Z6835 Body mass index (BMI) 35.0-35.9, adult: Secondary | ICD-10-CM | POA: Diagnosis not present

## 2021-05-12 DIAGNOSIS — R7301 Impaired fasting glucose: Secondary | ICD-10-CM | POA: Diagnosis not present

## 2021-05-12 DIAGNOSIS — E7800 Pure hypercholesterolemia, unspecified: Secondary | ICD-10-CM | POA: Diagnosis not present

## 2021-05-12 DIAGNOSIS — Z79899 Other long term (current) drug therapy: Secondary | ICD-10-CM | POA: Diagnosis not present

## 2021-05-12 DIAGNOSIS — I10 Essential (primary) hypertension: Secondary | ICD-10-CM | POA: Diagnosis not present

## 2021-05-14 DIAGNOSIS — L821 Other seborrheic keratosis: Secondary | ICD-10-CM | POA: Diagnosis not present

## 2021-05-14 DIAGNOSIS — L905 Scar conditions and fibrosis of skin: Secondary | ICD-10-CM | POA: Diagnosis not present

## 2021-05-14 DIAGNOSIS — L720 Epidermal cyst: Secondary | ICD-10-CM | POA: Diagnosis not present

## 2021-05-14 DIAGNOSIS — D1801 Hemangioma of skin and subcutaneous tissue: Secondary | ICD-10-CM | POA: Diagnosis not present

## 2021-05-14 DIAGNOSIS — L820 Inflamed seborrheic keratosis: Secondary | ICD-10-CM | POA: Diagnosis not present

## 2021-05-14 DIAGNOSIS — D225 Melanocytic nevi of trunk: Secondary | ICD-10-CM | POA: Diagnosis not present

## 2021-05-14 DIAGNOSIS — L814 Other melanin hyperpigmentation: Secondary | ICD-10-CM | POA: Diagnosis not present

## 2021-05-14 DIAGNOSIS — R21 Rash and other nonspecific skin eruption: Secondary | ICD-10-CM | POA: Diagnosis not present

## 2021-05-14 DIAGNOSIS — D2372 Other benign neoplasm of skin of left lower limb, including hip: Secondary | ICD-10-CM | POA: Diagnosis not present

## 2021-05-26 DIAGNOSIS — L308 Other specified dermatitis: Secondary | ICD-10-CM | POA: Diagnosis not present

## 2021-06-25 DIAGNOSIS — E7800 Pure hypercholesterolemia, unspecified: Secondary | ICD-10-CM | POA: Diagnosis not present

## 2021-06-25 DIAGNOSIS — R7301 Impaired fasting glucose: Secondary | ICD-10-CM | POA: Diagnosis not present

## 2021-06-25 DIAGNOSIS — E669 Obesity, unspecified: Secondary | ICD-10-CM | POA: Diagnosis not present

## 2021-07-06 DIAGNOSIS — H2513 Age-related nuclear cataract, bilateral: Secondary | ICD-10-CM | POA: Diagnosis not present

## 2021-07-06 DIAGNOSIS — H524 Presbyopia: Secondary | ICD-10-CM | POA: Diagnosis not present

## 2021-07-06 DIAGNOSIS — H02831 Dermatochalasis of right upper eyelid: Secondary | ICD-10-CM | POA: Diagnosis not present

## 2021-07-06 DIAGNOSIS — H43813 Vitreous degeneration, bilateral: Secondary | ICD-10-CM | POA: Diagnosis not present

## 2021-07-14 ENCOUNTER — Other Ambulatory Visit: Payer: Self-pay

## 2021-07-14 ENCOUNTER — Ambulatory Visit: Payer: PPO | Admitting: Podiatry

## 2021-07-14 ENCOUNTER — Ambulatory Visit (INDEPENDENT_AMBULATORY_CARE_PROVIDER_SITE_OTHER): Payer: PPO

## 2021-07-14 DIAGNOSIS — M25571 Pain in right ankle and joints of right foot: Secondary | ICD-10-CM | POA: Diagnosis not present

## 2021-07-14 DIAGNOSIS — M7671 Peroneal tendinitis, right leg: Secondary | ICD-10-CM

## 2021-07-14 DIAGNOSIS — S99911A Unspecified injury of right ankle, initial encounter: Secondary | ICD-10-CM

## 2021-07-14 DIAGNOSIS — G8929 Other chronic pain: Secondary | ICD-10-CM

## 2021-07-14 MED ORDER — MELOXICAM 15 MG PO TABS: 15.0000 mg | ORAL_TABLET | Freq: Every day | ORAL | 0 refills | Status: DC

## 2021-07-14 NOTE — Progress Notes (Signed)
°  Subjective:  Patient ID: Jamie Glover, female    DOB: 1955-10-18,  MRN: [id number]  No chief complaint on file.  66 y.o. female presents with the above complaint. History confirmed with patient. States back in 2021 she tore something in her foot - felt a loud pop. At the time she was potentially going to have further workup with MRI but never proceeded as the area felt better. Has been hurting again since a trip to [place] in December 2022. Pain rated at 6/10. Has tried ice and compression wrap without relief.  Objective:  Physical Exam: warm, good capillary refill, no trophic changes or ulcerative lesions, normal DP and PT pulses, and normal sensory exam.  Right Foot: POP right peroneal tendons, no pain at the ATFL. Pain worst at the distal fibula. Pain with inv/ev of the ankle. Active DF/PF/Inv/Ev. No pain on 1st TMT DF/PF.   No images are attached to the encounter.  Radiographs: X-ray of the right ankle: no fracture, dislocation, swelling or degenerative changes noted Assessment:   1. Peroneal tendonitis, right   2. Chronic pain of right ankle     Plan:  Patient was evaluated and treated and all questions answered.  Peroneal tendonitis right -XR reviewed with patient -Educated on etiology of injury -Immobilize in Tr[place]ock ankle brace. Dispensed today. Educated on use -Meloxicam 15mg  qd, advised to take with food. -Should issues persist consider MRI and/or PT  Return in about 3 weeks (around 08/04/2021) for Tendonitis.

## 2021-07-15 ENCOUNTER — Other Ambulatory Visit: Payer: Self-pay | Admitting: Podiatry

## 2021-07-15 DIAGNOSIS — M7671 Peroneal tendinitis, right leg: Secondary | ICD-10-CM

## 2021-07-17 ENCOUNTER — Other Ambulatory Visit: Payer: Self-pay | Admitting: Family Medicine

## 2021-07-17 DIAGNOSIS — Z1231 Encounter for screening mammogram for malignant neoplasm of breast: Secondary | ICD-10-CM

## 2021-08-04 ENCOUNTER — Other Ambulatory Visit: Payer: Self-pay

## 2021-08-04 ENCOUNTER — Ambulatory Visit: Payer: PPO | Admitting: Podiatry

## 2021-08-04 DIAGNOSIS — M7671 Peroneal tendinitis, right leg: Secondary | ICD-10-CM | POA: Diagnosis not present

## 2021-08-04 DIAGNOSIS — M25571 Pain in right ankle and joints of right foot: Secondary | ICD-10-CM

## 2021-08-04 DIAGNOSIS — G8929 Other chronic pain: Secondary | ICD-10-CM | POA: Diagnosis not present

## 2021-08-04 NOTE — Patient Instructions (Signed)
Peroneal Tendinopathy Rehab Ask your health care provider which exercises are safe for you. Do exercises exactly as told by your health care provider and adjust them as directed. It is normal to feel mild stretching, pulling, tightness, or discomfort as you do these exercises. Stop right away if you feel sudden pain or your pain gets worse. Do not begin these exercises until told by your health care provider. Stretching and range-of-motion exercises These exercises warm up your muscles and joints and improve the movement and flexibility of your ankle. These exercises also help to relieve pain and stiffness. Gastroc and soleus stretch, standing This is an exercise in which you stand on a step and use your body weight to stretch your calf muscles. To do this exercise: Stand on the edge of a step on the ball of your left / right foot. The ball of your foot is on the walking surface, right under your toes. Keep your other foot firmly on the same step. Hold on to the wall, a railing, or a chair for balance. Slowly lift your other foot, allowing your body weight to press your left / right heel down over the edge of the step. You should feel a stretch in your left / right calf (gastrocnemius and soleus). Hold this position for __________ seconds. Return both feet to the step. Repeat this exercise with a slight bend in your left / right knee. Repeat __________ times with your left / right knee straight and __________ times with your left / right knee bent. Complete this exercise __________ times a day. Strengthening exercises These exercises build strength and endurance in your foot and ankle. Endurance is the ability to use your muscles for a long time, even after they get tired. Ankle dorsiflexion with band  Secure a rubber exercise band or tube to an object, such as a table leg, that will not move when the band is pulled. Secure the other end of the band around your left / right foot. Sit on the  floor, facing the object with your left / right leg extended. The band or tube should be slightly tense when your foot is relaxed. Slowly flex your left / right ankle and toes to bring your foot toward you (dorsiflexion). Hold this position for __________ seconds. Let the band or tube slowly pull your foot back to the starting position. Repeat __________ times. Complete this exercise __________ times a day. Ankle eversion Sit on the floor with your legs straight out in front of you. Loop a rubber exercise band or tube around the ball of your left / right foot. The ball of your foot is on the walking surface, right under your toes. Hold the ends of the band in your hands, or secure the band to a stable object. The band or tube should be slightly tense when your foot is relaxed. Slowly push your foot outward, away from your other leg (eversion). Hold this position for __________ seconds. Slowly return your foot to the starting position. Repeat __________ times. Complete this exercise __________ times a day. Plantar flexion, standing This exercise is sometimes called standing heel raise. Stand with your feet shoulder-width apart. Place your hands on a wall or table to steady yourself as needed, but try not to use it for support. Keep your weight spread evenly over the width of your feet while you slowly rise up on your toes (plantar flexion). If told by your health care provider: Shift your weight toward your left / right   leg until you feel challenged. ?Stand on your left / right leg only. ?Hold this position for __________ seconds. ?Repeat __________ times. Complete this exercise __________ times a day. ?Single leg stand ?Without shoes, stand near a railing or in a doorway. You may hold on to the railing or door frame as needed. ?Stand on your left / right foot. Keep your big toe down on the floor and try to keep your arch lifted. ?Do not roll to the outside of your foot. ?If this exercise is too  easy, you can try it with your eyes closed or while standing on a pillow. ?Hold this position for __________ seconds. ?Repeat __________ times. Complete this exercise __________ times a day. ?This information is not intended to replace advice given to you by your health care provider. Make sure you discuss any questions you have with your health care provider. ?Document Revised: 09/05/2018 Document Reviewed: 09/05/2018 ?Elsevier Patient Education ? [place]. ? ?

## 2021-08-06 ENCOUNTER — Ambulatory Visit
Admission: RE | Admit: 2021-08-06 | Discharge: 2021-08-06 | Disposition: A | Discharge status: Home / Self Care | Payer: PPO | Source: Ambulatory Visit | Attending: Family Medicine | Admitting: Family Medicine

## 2021-08-06 DIAGNOSIS — Z1231 Encounter for screening mammogram for malignant neoplasm of breast: Secondary | ICD-10-CM

## 2021-08-06 NOTE — Progress Notes (Signed)
Subjective: ?66 year old female presents the office today for follow-up evaluation of right lateral ankle pain, peroneal tendinitis.  Overall she said that she is doing better she is using the brace as well as the anti-inflammatories.  She said the pain level is 4/10 but overall improved.  This started on December 01, 2019.  She initially heard a pop and afterwards it did heal and several months later while doing a lot of walking the pain is come back.  No new injuries. ? ?Objective: ?AAO x3, NAD ?DP/PT pulses palpable bilaterally, CRT less than 3 seconds ?There is mild edema present along the course the peroneal tendon lateral posterior to lateral malleolus.  Mild tenderness palpation.  There is no pain along the Achilles tendon or other areas of discomfort.  There is no gross ankle instability present.  MMT 5/5.  No pain with calf compression, swelling, warmth, erythema ? ?Assessment: ?Peroneal tendinitis right side ? ?Plan: ?-All treatment options discussed with the patient including all alternatives, risks, complications.  ?-Overall she is continue to improve.  We discussed continue the brace, anti-inflammatories as needed we discussed weaning off of the anti-inflammatories.  Discussed stretching, rehab exercises as she improves we can wean off the brace as well.  We discussed physical therapy but ultimately decided to hold off on this today.  If needed will refer to physical therapy and if symptoms continue or worsen we will get an MRI but again since the pain is improving we will continue with the current treatment plan. ?-Patient encouraged to call the office with any questions, concerns, change in symptoms.  ? ?Trula Slade DPM ? ?

## 2021-09-10 DIAGNOSIS — Z6834 Body mass index (BMI) 34.0-34.9, adult: Secondary | ICD-10-CM | POA: Diagnosis not present

## 2021-09-10 DIAGNOSIS — R7301 Impaired fasting glucose: Secondary | ICD-10-CM | POA: Diagnosis not present

## 2021-09-15 ENCOUNTER — Ambulatory Visit: Payer: PPO | Admitting: Podiatry

## 2021-09-15 DIAGNOSIS — G8929 Other chronic pain: Secondary | ICD-10-CM | POA: Diagnosis not present

## 2021-09-15 DIAGNOSIS — S96911S Strain of unspecified muscle and tendon at ankle and foot level, right foot, sequela: Secondary | ICD-10-CM | POA: Diagnosis not present

## 2021-09-15 DIAGNOSIS — M7671 Peroneal tendinitis, right leg: Secondary | ICD-10-CM | POA: Diagnosis not present

## 2021-09-15 DIAGNOSIS — M25571 Pain in right ankle and joints of right foot: Secondary | ICD-10-CM | POA: Diagnosis not present

## 2021-09-15 DIAGNOSIS — R21 Rash and other nonspecific skin eruption: Secondary | ICD-10-CM

## 2021-09-15 MED ORDER — MELOXICAM 15 MG PO TABS: 15.0000 mg | ORAL_TABLET | Freq: Every day | ORAL | 0 refills | Status: AC

## 2021-09-15 NOTE — Progress Notes (Signed)
Subjective: ?66 year old female presents the office today for follow evaluation of right lateral ankle pain.  Since I saw her last she has stopped wearing the ankle brace and also has stopped using the meloxicam.  Since doing this she has noticed increased and recurrent pain to the area as well as swelling.  No new injuries. ? ?Also has secondary concerns she has a rash on top of her right foot.  She previously had the same symptoms of the left foot she saw dermatology for this and it was biopsied.  She was prescribed steroid cream which she has been using.  She is asking if I had any ideas on what this could be. ? ? ? ?Objective: ?AAO x3, NAD ?DP/PT pulses palpable bilaterally, CRT less than 3 seconds ?There is continuation tenderness palpation along the course the peroneal tendon just inferior and posterior to lateral malleolus curving towards the fifth metatarsal base and she also gets discomfort along the distal portion of the fifth metatarsal laterally.  There is no area pinpoint tenderness.  There is discomfort with eversion of the foot on the course the peroneal tendon.  There is localized edema to the lateral aspect ankle.  No pain to the medial aspect of the ankle. ?On the dorsal aspect of the right foot there is a raised erythematous rash which does itch at times but there is no drainage or pus.  On the picture below the 2 areas of fibrosis are from the prior biopsy she reports. ?No pain with calf compression, swelling, warmth, erythema ? ? ? ? ? ? ?Assessment: ?Peroneal tendon tear; skin rash right foot ? ?Plan: ?-All treatment options discussed with the patient including all alternatives, risks, complications.  ?-At this point she has stopped the bracing anti-inflammatories her symptoms have reoccurred.  I am still concerned about tendon rupture given her injury in 2021.  I ordered MRI of the right foot and ankle and assist with potential surgical planning.  In the meantime continue anti-inflammatories  and I refilled meloxicam to take as needed as well as continue the ankle brace. ?-For the skin rash she is tenderness in her biopsy.  Discussed further biopsy in the right side if needed.  Continue steroid cream for now. ?-Patient encouraged to call the office with any questions, concerns, change in symptoms.  ? ?Trula Slade DPM ? ?

## 2021-09-27 ENCOUNTER — Ambulatory Visit
Admission: RE | Admit: 2021-09-27 | Discharge: 2021-09-27 | Disposition: A | Discharge status: Home / Self Care | Payer: PPO | Source: Ambulatory Visit | Attending: Podiatry | Admitting: Podiatry

## 2021-09-27 DIAGNOSIS — M19071 Primary osteoarthritis, right ankle and foot: Secondary | ICD-10-CM | POA: Diagnosis not present

## 2021-09-27 DIAGNOSIS — R600 Localized edema: Secondary | ICD-10-CM | POA: Diagnosis not present

## 2021-09-27 DIAGNOSIS — M65871 Other synovitis and tenosynovitis, right ankle and foot: Secondary | ICD-10-CM | POA: Diagnosis not present

## 2021-09-27 DIAGNOSIS — S96911S Strain of unspecified muscle and tendon at ankle and foot level, right foot, sequela: Secondary | ICD-10-CM

## 2021-09-27 DIAGNOSIS — S86311A Strain of muscle(s) and tendon(s) of peroneal muscle group at lower leg level, right leg, initial encounter: Secondary | ICD-10-CM | POA: Diagnosis not present

## 2021-09-29 ENCOUNTER — Telehealth: Payer: Self-pay | Admitting: Podiatry

## 2021-09-29 NOTE — Telephone Encounter (Signed)
Attempted to call patient to go over MRI results.  No answer, left voicemail to call back.  Sent MyChart message with result. ?

## 2021-09-30 ENCOUNTER — Encounter: Payer: Self-pay | Admitting: Podiatry

## 2021-10-06 DIAGNOSIS — R262 Difficulty in walking, not elsewhere classified: Secondary | ICD-10-CM | POA: Diagnosis not present

## 2021-10-06 DIAGNOSIS — M25571 Pain in right ankle and joints of right foot: Secondary | ICD-10-CM | POA: Diagnosis not present

## 2021-10-06 DIAGNOSIS — M25671 Stiffness of right ankle, not elsewhere classified: Secondary | ICD-10-CM | POA: Diagnosis not present

## 2021-10-09 DIAGNOSIS — M25571 Pain in right ankle and joints of right foot: Secondary | ICD-10-CM | POA: Diagnosis not present

## 2021-10-09 DIAGNOSIS — M25671 Stiffness of right ankle, not elsewhere classified: Secondary | ICD-10-CM | POA: Diagnosis not present

## 2021-10-09 DIAGNOSIS — R262 Difficulty in walking, not elsewhere classified: Secondary | ICD-10-CM | POA: Diagnosis not present

## 2021-10-13 DIAGNOSIS — M25671 Stiffness of right ankle, not elsewhere classified: Secondary | ICD-10-CM | POA: Diagnosis not present

## 2021-10-13 DIAGNOSIS — M25571 Pain in right ankle and joints of right foot: Secondary | ICD-10-CM | POA: Diagnosis not present

## 2021-10-13 DIAGNOSIS — R262 Difficulty in walking, not elsewhere classified: Secondary | ICD-10-CM | POA: Diagnosis not present

## 2021-10-16 DIAGNOSIS — M25671 Stiffness of right ankle, not elsewhere classified: Secondary | ICD-10-CM | POA: Diagnosis not present

## 2021-10-16 DIAGNOSIS — R262 Difficulty in walking, not elsewhere classified: Secondary | ICD-10-CM | POA: Diagnosis not present

## 2021-10-16 DIAGNOSIS — M25571 Pain in right ankle and joints of right foot: Secondary | ICD-10-CM | POA: Diagnosis not present

## 2021-10-20 DIAGNOSIS — M25671 Stiffness of right ankle, not elsewhere classified: Secondary | ICD-10-CM | POA: Diagnosis not present

## 2021-10-20 DIAGNOSIS — R262 Difficulty in walking, not elsewhere classified: Secondary | ICD-10-CM | POA: Diagnosis not present

## 2021-10-20 DIAGNOSIS — M25571 Pain in right ankle and joints of right foot: Secondary | ICD-10-CM | POA: Diagnosis not present

## 2021-10-23 DIAGNOSIS — R262 Difficulty in walking, not elsewhere classified: Secondary | ICD-10-CM | POA: Diagnosis not present

## 2021-10-23 DIAGNOSIS — M25571 Pain in right ankle and joints of right foot: Secondary | ICD-10-CM | POA: Diagnosis not present

## 2021-10-23 DIAGNOSIS — M25671 Stiffness of right ankle, not elsewhere classified: Secondary | ICD-10-CM | POA: Diagnosis not present

## 2021-10-28 DIAGNOSIS — M25671 Stiffness of right ankle, not elsewhere classified: Secondary | ICD-10-CM | POA: Diagnosis not present

## 2021-10-28 DIAGNOSIS — R262 Difficulty in walking, not elsewhere classified: Secondary | ICD-10-CM | POA: Diagnosis not present

## 2021-10-28 DIAGNOSIS — M25571 Pain in right ankle and joints of right foot: Secondary | ICD-10-CM | POA: Diagnosis not present

## 2021-10-30 DIAGNOSIS — M25671 Stiffness of right ankle, not elsewhere classified: Secondary | ICD-10-CM | POA: Diagnosis not present

## 2021-10-30 DIAGNOSIS — M25571 Pain in right ankle and joints of right foot: Secondary | ICD-10-CM | POA: Diagnosis not present

## 2021-10-30 DIAGNOSIS — R262 Difficulty in walking, not elsewhere classified: Secondary | ICD-10-CM | POA: Diagnosis not present

## 2021-11-03 DIAGNOSIS — M67979 Unspecified disorder of synovium and tendon, unspecified ankle and foot: Secondary | ICD-10-CM | POA: Diagnosis not present

## 2021-11-03 DIAGNOSIS — Z0000 Encounter for general adult medical examination without abnormal findings: Secondary | ICD-10-CM | POA: Diagnosis not present

## 2021-11-03 DIAGNOSIS — G4733 Obstructive sleep apnea (adult) (pediatric): Secondary | ICD-10-CM | POA: Diagnosis not present

## 2021-11-03 DIAGNOSIS — N390 Urinary tract infection, site not specified: Secondary | ICD-10-CM | POA: Diagnosis not present

## 2021-11-03 DIAGNOSIS — E669 Obesity, unspecified: Secondary | ICD-10-CM | POA: Diagnosis not present

## 2021-11-03 DIAGNOSIS — Z6835 Body mass index (BMI) 35.0-35.9, adult: Secondary | ICD-10-CM | POA: Diagnosis not present

## 2021-11-03 DIAGNOSIS — E7800 Pure hypercholesterolemia, unspecified: Secondary | ICD-10-CM | POA: Diagnosis not present

## 2021-11-03 DIAGNOSIS — G4730 Sleep apnea, unspecified: Secondary | ICD-10-CM | POA: Diagnosis not present

## 2021-11-03 DIAGNOSIS — I10 Essential (primary) hypertension: Secondary | ICD-10-CM | POA: Diagnosis not present

## 2021-11-03 DIAGNOSIS — E2839 Other primary ovarian failure: Secondary | ICD-10-CM | POA: Diagnosis not present

## 2021-11-03 DIAGNOSIS — R7301 Impaired fasting glucose: Secondary | ICD-10-CM | POA: Diagnosis not present

## 2021-11-05 DIAGNOSIS — M25671 Stiffness of right ankle, not elsewhere classified: Secondary | ICD-10-CM | POA: Diagnosis not present

## 2021-11-05 DIAGNOSIS — R262 Difficulty in walking, not elsewhere classified: Secondary | ICD-10-CM | POA: Diagnosis not present

## 2021-11-05 DIAGNOSIS — M25571 Pain in right ankle and joints of right foot: Secondary | ICD-10-CM | POA: Diagnosis not present

## 2021-11-10 DIAGNOSIS — R262 Difficulty in walking, not elsewhere classified: Secondary | ICD-10-CM | POA: Diagnosis not present

## 2021-11-10 DIAGNOSIS — M25571 Pain in right ankle and joints of right foot: Secondary | ICD-10-CM | POA: Diagnosis not present

## 2021-11-10 DIAGNOSIS — M25671 Stiffness of right ankle, not elsewhere classified: Secondary | ICD-10-CM | POA: Diagnosis not present

## 2021-11-12 DIAGNOSIS — M25571 Pain in right ankle and joints of right foot: Secondary | ICD-10-CM | POA: Diagnosis not present

## 2021-11-12 DIAGNOSIS — R262 Difficulty in walking, not elsewhere classified: Secondary | ICD-10-CM | POA: Diagnosis not present

## 2021-11-12 DIAGNOSIS — M25671 Stiffness of right ankle, not elsewhere classified: Secondary | ICD-10-CM | POA: Diagnosis not present

## 2021-11-17 DIAGNOSIS — R262 Difficulty in walking, not elsewhere classified: Secondary | ICD-10-CM | POA: Diagnosis not present

## 2021-11-17 DIAGNOSIS — M25571 Pain in right ankle and joints of right foot: Secondary | ICD-10-CM | POA: Diagnosis not present

## 2021-11-17 DIAGNOSIS — M25671 Stiffness of right ankle, not elsewhere classified: Secondary | ICD-10-CM | POA: Diagnosis not present

## 2021-11-19 DIAGNOSIS — R262 Difficulty in walking, not elsewhere classified: Secondary | ICD-10-CM | POA: Diagnosis not present

## 2021-11-19 DIAGNOSIS — M25571 Pain in right ankle and joints of right foot: Secondary | ICD-10-CM | POA: Diagnosis not present

## 2021-11-19 DIAGNOSIS — M25671 Stiffness of right ankle, not elsewhere classified: Secondary | ICD-10-CM | POA: Diagnosis not present

## 2021-11-24 DIAGNOSIS — M25571 Pain in right ankle and joints of right foot: Secondary | ICD-10-CM | POA: Diagnosis not present

## 2021-11-24 DIAGNOSIS — R262 Difficulty in walking, not elsewhere classified: Secondary | ICD-10-CM | POA: Diagnosis not present

## 2021-11-24 DIAGNOSIS — M25671 Stiffness of right ankle, not elsewhere classified: Secondary | ICD-10-CM | POA: Diagnosis not present

## 2021-12-02 DIAGNOSIS — M25561 Pain in right knee: Secondary | ICD-10-CM | POA: Diagnosis not present

## 2021-12-02 DIAGNOSIS — G8929 Other chronic pain: Secondary | ICD-10-CM | POA: Diagnosis not present

## 2021-12-03 ENCOUNTER — Telehealth: Payer: Self-pay | Admitting: Podiatry

## 2021-12-03 NOTE — Telephone Encounter (Signed)
Pt left message stating she has changed her physical therapy due her primary care recommendation. She is going to [facility] [place] instead of [facility] due to having additional services they offer to incorporate different areas. She stated she has not given up on her foot.

## 2021-12-07 NOTE — Therapy (Signed)
OUTPATIENT PHYSICAL THERAPY LOWER EXTREMITY EVALUATION   Patient Name: Jamie Glover MRN: [id number] DOB:01/25/1956, 66 y.o., female Today's Date: 12/08/2021   PT End of Session - 12/08/21 2057     Visit Number 1    Date for PT Re-Evaluation 01/20/22    Authorization Type [employer]    Authorization Time Period 12/08/21 to 01/20/22    PT Start Time 1536    PT Stop Time 1625    PT Time Calculation (min) 49 min    Activity Tolerance Patient tolerated treatment well;No increased pain    Behavior During Therapy WFL for tasks assessed/performed             Past Medical History:  Diagnosis Date   Arthritis    Hypertension    OSA (obstructive sleep apnea) 2013   Past Surgical History:  Procedure Laterality Date   BREAST BIOPSY     c-sections     CHOLECYSTECTOMY     CHOLECYSTECTOMY, LAPAROSCOPIC  1996   right knee surgery     Patient Active Problem List   Diagnosis Date Noted   Peroneal tendonitis, right 09/15/2021   Unilateral primary osteoarthritis, left knee 04/14/2020   Primary osteoarthritis of left knee 01/15/2019   Unilateral primary osteoarthritis, right knee 12/26/2017   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 ear 12/19/2017   Eustachian tube dysfunction, left 12/19/2017   Chronic serous otitis media of left ear 12/19/2017   Tinnitus 10/01/2011   Vitamin D deficiency 10/01/2011   GERD (gastroesophageal reflux disease) 10/01/2011   Neck pain 10/01/2011   HTN (hypertension) 10/01/2011   Sleep apnea 09/29/2011   BMI 33.0-33.9,adult 09/29/2011    PCP: Milagros Evener, MD  REFERRING PROVIDER: Faustino Congress, NP  REFERRING DIAG: Pain in Rt knee, Pain in Rt leg, Pain in Lt leg   THERAPY DIAG:  Pain in right leg  Pain in left leg  Stiffness of right ankle, not elsewhere classified  Rationale for Evaluation and Treatment Rehabilitation  ONSET DATE: 2 years ago  SUBJECTIVE:    SUBJECTIVE STATEMENT: Pt states that about 2 years ago, she was walking and felt a pop in her Rt foot. Some time went by and her foot was feeling a little better. She went on a trip and did a lot of walking which seemed to cause a flare-up in her foot pain. In January, she saw a podiatrist and was put in a boot for 3 weeks and had no improvement. She was sent to PT but was 1 of 4 pts at a time and felt that after 8 weeks she was not seeing much improvement. While at PT, she also noticed her Lt knee/outer leg was bothering her during her sessions. She has been on Mobic the last week and feels that her pain is better than usual because of this.   PERTINENT HISTORY:  Arthritis Rt knee, not much improvement with PT   PAIN:  Are you having pain? Yes: NPRS scale: 1/10 Pain location: Rt lateral foot Pain description: sore Aggravating factors: walking, heel raises, standing on 1 leg Relieving factors: resting off her feet  PRECAUTIONS: None  WEIGHT BEARING RESTRICTIONS No  FALLS:  Has patient fallen in last 6 months? No  LIVING ENVIRONMENT: Lives with: lives with their family Lives in: House/apartment Stairs: Yes: External: 5 steps;   Has following equipment at home: None  OCCUPATION: retired Marine scientist   PLOF: Independent  PATIENT GOALS be able to walk without  increase in Rt and Lt LE pain    OBJECTIVE:   DIAGNOSTIC FINDINGS: MRI Rt foot  PATIENT SURVEYS:  FOTO 55  COGNITION:  Overall cognitive status: Within functional limits for tasks assessed     SENSATION: Denies numbness/tingling  EDEMA:  None noticed   POSTURE:  LE adduction, foot eversion  PALPATION: Tenderness over lateral aspect of the foot, at peroneal tendon insertion  LOWER EXTREMITY ROM:  Passive ROM Right eval Left eval  Hip flexion    Hip extension    Hip abduction    Hip adduction    Hip internal rotation    Hip external rotation    Knee flexion    Knee extension    Ankle dorsiflexion 0 0   Ankle plantarflexion    Ankle inversion 25 (+pain)  30  Ankle eversion     (Blank rows = not tested)  LOWER EXTREMITY MMT:  MMT Right eval Left eval  Hip flexion    Hip extension 3/5 3/5  Hip abduction 3/5 3/5  Hip adduction    Hip internal rotation    Hip external rotation    Knee flexion 4/5 4/5  Knee extension 5/5 5/5  Ankle dorsiflexion 5/5   Ankle plantarflexion Unable to complete full range heel raise X10 reps   Ankle inversion 5/5   Ankle eversion 5/5    (Blank rows = not tested)  LOWER EXTREMITY SPECIAL TESTS:    FUNCTIONAL TESTS:    GAIT: Distance walked:  Assistive device utilized:  Level of assistance:  Comments: Pt ambulates with decreased glute activation, (+) trendelenburg bilateral, hip adduction, foot eversion  Stairs: (+) trendelenburg, poor eccentric control bilaterally when descending steps    TODAY'S TREATMENT:  Exercises - Supine Bridge with Resistance Band   - Supine Piriformis Stretch with knee to opposite chest  - Sidelying hip abduction x10 reps each  PATIENT EDUCATION:  Education details: eval findings/POC; HEP Person educated: Patient Education method: Explanation Education comprehension: returned demonstration   HOME EXERCISE PROGRAM: Access Code: [email or web address] URL: [email or web address] Date: 12/08/2021 Prepared by: [facility]  Exercises - Supine Bridge with Resistance Band  - 1 x daily - 7 x weekly - 2-3 sets - 10 reps - Sidelying Hip Abduction  - 1 x daily - 7 x weekly - 1-2 sets - 10 reps - Supine Piriformis Stretch with Foot on Ground  - 1 x daily - 7 x weekly - 2 sets - 30 sec hold  ASSESSMENT:  CLINICAL IMPRESSION: Patient is a 66 y.o. F who was seen today for physical therapy evaluation and treatment for Rt lateral foot pain, Rt chronic knee pain and recent onset of Lt lateral knee/ITB pain. Pt did not have improvements with PT previously, but she feels this  is due to being one of multiple patient's being treated at the same time. Pt has good strength of the Rt ankle, except for gastroc strength and limited ankle dorsiflexion ROM. In addition, she has bilateral hip weakness which contributes to her Rt and Lt LE pain and movement dysfunction. She is limited in her ability to walk around the neighborhood and during exacerbations, she is limited in her day to day activity. She would benefit from skilled PT to address her limitations in LE strength, ROM, neuromuscular control and facilitate her return to daily walking and traveling without significant pain.     OBJECTIVE IMPAIRMENTS Abnormal gait, decreased activity tolerance, decreased balance, decreased endurance, decreased mobility, difficulty walking,  decreased ROM, decreased strength, hypomobility, increased muscle spasms, impaired flexibility, improper body mechanics, postural dysfunction, and pain.   ACTIVITY LIMITATIONS standing, stairs, and locomotion level  PARTICIPATION LIMITATIONS: community activity  PERSONAL FACTORS Age and Time since onset of injury/illness/exacerbation are also affecting patient's functional outcome.   REHAB POTENTIAL: Good  CLINICAL DECISION MAKING: Evolving/moderate complexity  EVALUATION COMPLEXITY: Moderate   GOALS: Goals reviewed with patient? Yes  SHORT TERM GOALS: Target date: 12/22/2021  Pt will be independent with her initial HEP to improve LE strength and flexibility.  Baseline: Goal status: INITIAL    LONG TERM GOALS: Target date: 01/19/2022   Pt will have improved Rt gastroc strength, evident by her ability to complete 15 single leg heel raises.  Baseline:  Goal status: INITIAL  2.  Pt will have improved hip strength to 4/5 MMT. Baseline:  Goal status: INITIAL  3.  Pt will have increase in ankle dorsiflexion to atleast 10 deg for improved foot clearance during ambulation.  Baseline:  Goal status: INITIAL  4.  Pt will be able to descend  clinic steps with adequate eccentric control and without increase in foot/knee pain. Baseline:  Goal status: INITIAL  5.  Pt will report atleast 50% improvement in her Rt foot pain from the start of PT. Baseline:  Goal status: INITIAL    PLAN: PT FREQUENCY: 2x/week  PT DURATION: 6 weeks  PLANNED INTERVENTIONS: Therapeutic exercises, Therapeutic activity, Neuromuscular re-education, Balance training, Gait training, Patient/Family education, Joint mobilization, Stair training, Aquatic Therapy, Dry Needling, Cryotherapy, Taping, and Manual therapy  PLAN FOR NEXT SESSION: info on aquatic PT possibly 1x/week with land PT, progress glute strength, f/u on foot pain after pain meds wore off and add to HEP: ankle DF ROM, ankle eversion strength, STM as needed peroneals/gastrocs if needed   9:26 PM,12/08/21 Sherol Dade PT, DPT [facility] [place]

## 2021-12-08 ENCOUNTER — Ambulatory Visit: Payer: PPO | Attending: Family Medicine | Admitting: Physical Therapy

## 2021-12-08 ENCOUNTER — Encounter: Payer: Self-pay | Admitting: Physical Therapy

## 2021-12-08 DIAGNOSIS — M79605 Pain in left leg: Secondary | ICD-10-CM | POA: Insufficient documentation

## 2021-12-08 DIAGNOSIS — M79604 Pain in right leg: Secondary | ICD-10-CM | POA: Insufficient documentation

## 2021-12-08 DIAGNOSIS — M25671 Stiffness of right ankle, not elsewhere classified: Secondary | ICD-10-CM | POA: Insufficient documentation

## 2021-12-10 ENCOUNTER — Ambulatory Visit: Payer: PPO | Admitting: Physical Therapy

## 2021-12-10 DIAGNOSIS — M79605 Pain in left leg: Secondary | ICD-10-CM

## 2021-12-10 DIAGNOSIS — M25671 Stiffness of right ankle, not elsewhere classified: Secondary | ICD-10-CM

## 2021-12-10 DIAGNOSIS — M79604 Pain in right leg: Secondary | ICD-10-CM

## 2021-12-10 NOTE — Therapy (Signed)
OUTPATIENT PHYSICAL THERAPY LOWER EXTREMITY PROGRESS NOTE   Patient Name: Jamie Glover MRN: [id number] DOB:March 13, 1956, 66 y.o., female Today's Date: 12/10/2021   PT End of Session - 12/10/21 0800     Visit Number 2    Date for PT Re-Evaluation 01/20/22    Authorization Type [employer]    Authorization Time Period 12/08/21 to 01/20/22    PT Start Time 0801    PT Stop Time 0840    PT Time Calculation (min) 39 min    Activity Tolerance Patient tolerated treatment well             Past Medical History:  Diagnosis Date   Arthritis    Hypertension    OSA (obstructive sleep apnea) 2013   Past Surgical History:  Procedure Laterality Date   BREAST BIOPSY     c-sections     CHOLECYSTECTOMY     CHOLECYSTECTOMY, LAPAROSCOPIC  1996   right knee surgery     Patient Active Problem List   Diagnosis Date Noted   Peroneal tendonitis, right 09/15/2021   Unilateral primary osteoarthritis, left knee 04/14/2020   Primary osteoarthritis of left knee 01/15/2019   Unilateral primary osteoarthritis, right knee 12/26/2017   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 ear 12/19/2017   Eustachian tube dysfunction, left 12/19/2017   Chronic serous otitis media of left ear 12/19/2017   Tinnitus 10/01/2011   Vitamin D deficiency 10/01/2011   GERD (gastroesophageal reflux disease) 10/01/2011   Neck pain 10/01/2011   HTN (hypertension) 10/01/2011   Sleep apnea 09/29/2011   BMI 33.0-33.9,adult 09/29/2011    PCP: Milagros Evener, MD  REFERRING PROVIDER: Faustino Congress, NP  REFERRING DIAG: Pain in Rt knee, Pain in Rt leg, Pain in Lt leg   THERAPY DIAG:  Pain in right leg  Pain in left leg  Stiffness of right ankle, not elsewhere classified  Rationale for Evaluation and Treatment Rehabilitation  ONSET DATE: 2 years ago  SUBJECTIVE:   SUBJECTIVE STATEMENT: The patient reports a good understanding of initial HEP and  does not feel a review is necessary.  She is wearing her Birkenstock shoes (which are the most comfortable) but also brought her Skechers (doesn't feel there is enough arch support).  Walking regularly.      PERTINENT HISTORY:  Arthritis Rt knee, not much improvement with PT  Pt states that about 2 years ago, she was walking and felt a pop in her Rt foot. Some time went by and her foot was feeling a little better. She went on a trip and did a lot of walking which seemed to cause a flare-up in her foot pain. In January, she saw a podiatrist and was put in a boot for 3 weeks and had no improvement. She was sent to PT but was 1 of 4 pts at a time and felt that after 8 weeks she was not seeing much improvement. While at PT, she also noticed her Lt knee/outer leg was bothering her during her sessions. She has been on Mobic the last week and feels that her pain is better than usual because of this.   PAIN:  Are you having pain? Yes: NPRS scale: 1-2/10 Pain location: Rt lateral foot Pain description: sore Aggravating factors: walking, heel raises, standing on 1 leg Relieving factors: resting off her feet  PRECAUTIONS: None  WEIGHT BEARING RESTRICTIONS No  FALLS:  Has patient fallen in last 6 months? No  LIVING ENVIRONMENT: Lives  with: lives with their family Lives in: House/apartment Stairs: Yes: External: 5 steps;   Has following equipment at home: None  OCCUPATION: retired Marine scientist   PLOF: Independent  PATIENT GOALS be able to walk without increase in Rt and Lt LE pain    OBJECTIVE:   DIAGNOSTIC FINDINGS: MRI Rt foot  PATIENT SURVEYS:  FOTO 55  COGNITION:  Overall cognitive status: Within functional limits for tasks assessed     SENSATION: Denies numbness/tingling  EDEMA:  None noticed   POSTURE:  LE adduction, foot eversion  PALPATION: Tenderness over lateral aspect of the foot, at peroneal tendon insertion  LOWER EXTREMITY ROM:  Passive ROM Right eval Left eval   Hip flexion    Hip extension    Hip abduction    Hip adduction    Hip internal rotation    Hip external rotation    Knee flexion    Knee extension    Ankle dorsiflexion 0 0  Ankle plantarflexion    Ankle inversion 25 (+pain)  30  Ankle eversion     (Blank rows = not tested)  LOWER EXTREMITY MMT:  MMT Right eval Left eval  Hip flexion    Hip extension 3/5 3/5  Hip abduction 3/5 3/5  Hip adduction    Hip internal rotation    Hip external rotation    Knee flexion 4/5 4/5  Knee extension 5/5 5/5  Ankle dorsiflexion 5/5   Ankle plantarflexion Unable to complete full range heel raise X10 reps   Ankle inversion 5/5   Ankle eversion 5/5    (Blank rows = not tested)  LOWER EXTREMITY SPECIAL TESTS:    FUNCTIONAL TESTS:    GAIT: Distance walked:  Assistive device utilized:  Level of assistance:  Comments: Pt ambulates with decreased glute activation, (+) trendelenburg bilateral, hip adduction, foot eversion  Stairs: (+) trendelenburg, poor eccentric control bilaterally when descending steps    TODAY'S TREATMENT: 7/13: Seated piriformis stretch 20-30 sec hold right/left 2x 2nd step hip flexor stretch with UE elevation and UE reach over for ITB stretch (added towel lift under right heel for relief of knee pain) Pink ball on wall hip abduction isometric 5 sec hold 10x right/left Seated pink ball ankle inversion and eversion isometrics 5 sec hold 10x each Sit to stand without UE support 5x, with eyes closed 5x, on blue foam 3x  Neuromuscular re-ed: activation glute med, activating quads, foot intrinsics, peroneals Therapeutic activity: walking, standing, sit to stand     PATIENT EDUCATION:  Education details: eval findings/POC; HEP Person educated: Patient Education method: Explanation Education comprehension: returned demonstration   HOME EXERCISE PROGRAM: Access Code: [email or web address] URL: [email or web address] Date: 12/10/2021 Prepared by: Ruben Im  Exercises - Supine Bridge with Resistance Band  - 1 x daily - 7 x weekly - 2-3 sets - 10 reps - Sidelying Hip Abduction  - 1 x daily - 7 x weekly - 1-2 sets - 10 reps - Supine Piriformis Stretch with Foot on Ground  - 1 x daily - 7 x weekly - 2 sets - 30 sec hold - Standing Knee Flexion Stretch on Step  - 1 x daily - 7 x weekly - 1 sets - 10 reps - Standing Isometric Hip Abduction with Ball on Wall  - 1 x daily - 7 x weekly - 1 sets - 5 reps - 5 hold - Long Sitting Isometric Ankle Inversion in Dorsiflexion with Ball at [place]  - 1 x daily - 7 x weekly -  1 sets - 10 reps - 5 hold - Long Sitting Isometric Ankle Eversion in Dorsiflexion with Ball at [place]  - 1 x daily - 7 x weekly - 3 sets - 10 reps - 5 hold - Sit to Stand  - 1 x daily - 7 x weekly - 1 sets - 10 reps ASSESSMENT:  CLINICAL IMPRESSION: Initiated a progression of HEP to include lengthening of gastroc, hip flexors and ITB, strengthening of glute medius muscles bil, quad strengthening and ankle strengthening.  Some modification needed for right knee pain with 2nd step stretch but overall tolerated ex's well with minimal pain level 2-3/10.  She would benefit from shoe wear discussion for a better running/walking shoe with arch support.    OBJECTIVE IMPAIRMENTS Abnormal gait, decreased activity tolerance, decreased balance, decreased endurance, decreased mobility, difficulty walking, decreased ROM, decreased strength, hypomobility, increased muscle spasms, impaired flexibility, improper body mechanics, postural dysfunction, and pain.   ACTIVITY LIMITATIONS standing, stairs, and locomotion level  PARTICIPATION LIMITATIONS: community activity  PERSONAL FACTORS Age and Time since onset of injury/illness/exacerbation are also affecting patient's functional outcome.   REHAB POTENTIAL: Good  CLINICAL DECISION MAKING: Evolving/moderate complexity  EVALUATION COMPLEXITY: Moderate   GOALS: Goals reviewed with patient? Yes  SHORT  TERM GOALS: Target date: 12/24/2021  Pt will be independent with her initial HEP to improve LE strength and flexibility.  Baseline: Goal status: INITIAL    LONG TERM GOALS: Target date: 01/21/2022   Pt will have improved Rt gastroc strength, evident by her ability to complete 15 single leg heel raises.  Baseline:  Goal status: INITIAL  2.  Pt will have improved hip strength to 4/5 MMT. Baseline:  Goal status: INITIAL  3.  Pt will have increase in ankle dorsiflexion to atleast 10 deg for improved foot clearance during ambulation.  Baseline:  Goal status: INITIAL  4.  Pt will be able to descend clinic steps with adequate eccentric control and without increase in foot/knee pain. Baseline:  Goal status: INITIAL  5.  Pt will report atleast 50% improvement in her Rt foot pain from the start of PT. Baseline:  Goal status: INITIAL    PLAN: PT FREQUENCY: 2x/week  PT DURATION: 6 weeks  PLANNED INTERVENTIONS: Therapeutic exercises, Therapeutic activity, Neuromuscular re-education, Balance training, Gait training, Patient/Family education, Joint mobilization, Stair training, Aquatic Therapy, Dry Needling, Cryotherapy, Taping, and Manual therapy  PLAN FOR NEXT SESSION: discuss shoe wear/Fleet Feet referral (give coupon); familiar with Nu-step (liked in the past); info on aquatic PT possibly 1x/week with land PT, progress glute strength, f/u on foot pain after pain meds wore off and add to HEP: ankle DF ROM, ankle eversion strength, STM as needed peroneals/gastrocs if needed   Ruben Im, PT 12/10/21 6:04 PM Phone: [phone] Fax: [phone]

## 2021-12-16 ENCOUNTER — Ambulatory Visit: Payer: PPO

## 2021-12-16 DIAGNOSIS — M25671 Stiffness of right ankle, not elsewhere classified: Secondary | ICD-10-CM

## 2021-12-16 DIAGNOSIS — R262 Difficulty in walking, not elsewhere classified: Secondary | ICD-10-CM

## 2021-12-16 DIAGNOSIS — M79604 Pain in right leg: Secondary | ICD-10-CM | POA: Diagnosis not present

## 2021-12-16 DIAGNOSIS — M6281 Muscle weakness (generalized): Secondary | ICD-10-CM

## 2021-12-16 DIAGNOSIS — M79605 Pain in left leg: Secondary | ICD-10-CM

## 2021-12-16 NOTE — Therapy (Signed)
OUTPATIENT PHYSICAL THERAPY LOWER EXTREMITY PROGRESS NOTE   Patient Name: Jamie Glover MRN: [id number] DOB:1956-04-29, 66 y.o., female Today's Date: 12/16/2021   PT End of Session - 12/16/21 1536     Visit Number 3    Date for PT Re-Evaluation 01/20/22    Authorization Type [employer]    Authorization Time Period 12/08/21 to 01/20/22    PT Start Time 1536    PT Stop Time 1615    PT Time Calculation (min) 39 min    Activity Tolerance Patient tolerated treatment well    Behavior During Therapy [facility] for tasks assessed/performed             Past Medical History:  Diagnosis Date   Arthritis    Hypertension    OSA (obstructive sleep apnea) 2013   Past Surgical History:  Procedure Laterality Date   BREAST BIOPSY     c-sections     CHOLECYSTECTOMY     CHOLECYSTECTOMY, LAPAROSCOPIC  1996   right knee surgery     Patient Active Problem List   Diagnosis Date Noted   Peroneal tendonitis, right 09/15/2021   Unilateral primary osteoarthritis, left knee 04/14/2020   Primary osteoarthritis of left knee 01/15/2019   Unilateral primary osteoarthritis, right knee 12/26/2017   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 ear 12/19/2017   Eustachian tube dysfunction, left 12/19/2017   Chronic serous otitis media of left ear 12/19/2017   Tinnitus 10/01/2011   Vitamin D deficiency 10/01/2011   GERD (gastroesophageal reflux disease) 10/01/2011   Neck pain 10/01/2011   HTN (hypertension) 10/01/2011   Sleep apnea 09/29/2011   BMI 33.0-33.9,adult 09/29/2011    PCP: Milagros Evener, MD  REFERRING PROVIDER: Faustino Congress, NP  REFERRING DIAG: Pain in Rt knee, Pain in Rt leg, Pain in Lt leg   THERAPY DIAG:  Pain in right leg  Pain in left leg  Stiffness of right ankle, not elsewhere classified  Muscle weakness (generalized)  Difficulty in walking, not elsewhere classified  Rationale for Evaluation and Treatment  Rehabilitation  ONSET DATE: 2 years ago  SUBJECTIVE:   SUBJECTIVE STATEMENT: Patient reports no new complaints.  She continues to have pain in right ankle, right knee and left hip but overall less pain.       PERTINENT HISTORY:  Arthritis Rt knee, not much improvement with PT  Pt states that about 2 years ago, she was walking and felt a pop in her Rt foot. Some time went by and her foot was feeling a little better. She went on a trip and did a lot of walking which seemed to cause a flare-up in her foot pain. In January, she saw a podiatrist and was put in a boot for 3 weeks and had no improvement. She was sent to PT but was 1 of 4 pts at a time and felt that after 8 weeks she was not seeing much improvement. While at PT, she also noticed her Lt knee/outer leg was bothering her during her sessions. She has been on Mobic the last week and feels that her pain is better than usual because of this.   PAIN:  Are you having pain? Yes: NPRS scale: 1-2/10 Pain location: Rt lateral foot Pain description: sore Aggravating factors: walking, heel raises, standing on 1 leg Relieving factors: resting off her feet  PRECAUTIONS: None  WEIGHT BEARING RESTRICTIONS No  FALLS:  Has patient fallen in last 6 months? No  LIVING ENVIRONMENT:  Lives with: lives with their family Lives in: House/apartment Stairs: Yes: External: 5 steps;   Has following equipment at home: None  OCCUPATION: retired Marine scientist   PLOF: Independent  PATIENT GOALS be able to walk without increase in Rt and Lt LE pain    OBJECTIVE:   DIAGNOSTIC FINDINGS: MRI Rt foot  PATIENT SURVEYS:  FOTO 55  COGNITION:  Overall cognitive status: Within functional limits for tasks assessed     SENSATION: Denies numbness/tingling  EDEMA:  None noticed   POSTURE:  LE adduction, foot eversion  PALPATION: Tenderness over lateral aspect of the foot, at peroneal tendon insertion  LOWER EXTREMITY ROM:  Passive ROM Right eval  Left eval  Hip flexion    Hip extension    Hip abduction    Hip adduction    Hip internal rotation    Hip external rotation    Knee flexion    Knee extension    Ankle dorsiflexion 0 0  Ankle plantarflexion    Ankle inversion 25 (+pain)  30  Ankle eversion     (Blank rows = not tested)  LOWER EXTREMITY MMT:  MMT Right eval Left eval  Hip flexion    Hip extension 3/5 3/5  Hip abduction 3/5 3/5  Hip adduction    Hip internal rotation    Hip external rotation    Knee flexion 4/5 4/5  Knee extension 5/5 5/5  Ankle dorsiflexion 5/5   Ankle plantarflexion Unable to complete full range heel raise X10 reps   Ankle inversion 5/5   Ankle eversion 5/5    (Blank rows = not tested)  LOWER EXTREMITY SPECIAL TESTS:    FUNCTIONAL TESTS:    GAIT: Distance walked:  Assistive device utilized:  Level of assistance:  Comments: Pt ambulates with decreased glute activation, (+) trendelenburg bilateral, hip adduction, foot eversion  Stairs: (+) trendelenburg, poor eccentric control bilaterally when descending steps    TODAY'S TREATMENT: 7/19: NuStep x 5 min level 3 Seated piriformis stretch 20-30 sec hold right/left 2x Sit to stand with 10 lb kb 2 x 10 (initially with right knee pain, added pad under right heel and this improved) Seated LAQ 2 x 10 with 4 lb ankle weights (palpable crepitus on right but no pain) Seated hip abduction x 20 with blue loop  Seated hamstring curl x 20 each LE with red tband Quad progression in supine: quad set x 20, TKE over noodle x 20,  SAQ over white foam roller x 20 with 4 lb,  SAQ over larger blue bolster x 20 with 4 lb Seated towel scrunch x 10 Seated inv/ev towel push x 10  7/13: Seated piriformis stretch 20-30 sec hold right/left 2x 2nd step hip flexor stretch with UE elevation and UE reach over for ITB stretch (added towel lift under right heel for relief of knee pain) Pink ball on wall hip abduction isometric 5 sec hold 10x  right/left Seated pink ball ankle inversion and eversion isometrics 5 sec hold 10x each Sit to stand without UE support 5x, with eyes closed 5x, on blue foam 3x  Neuromuscular re-ed: activation glute med, activating quads, foot intrinsics, peroneals Therapeutic activity: walking, standing, sit to stand     PATIENT EDUCATION:  Education details: eval findings/POC; HEP Person educated: Patient Education method: Explanation Education comprehension: returned demonstration   HOME EXERCISE PROGRAM: Access Code: [email or web address] URL: [email or web address] Date: 12/10/2021 Prepared by: Ruben Im  Exercises - Supine Bridge with Resistance Band  - 1 x daily - 7  x weekly - 2-3 sets - 10 reps - Sidelying Hip Abduction  - 1 x daily - 7 x weekly - 1-2 sets - 10 reps - Supine Piriformis Stretch with Foot on Ground  - 1 x daily - 7 x weekly - 2 sets - 30 sec hold - Standing Knee Flexion Stretch on Step  - 1 x daily - 7 x weekly - 1 sets - 10 reps - Standing Isometric Hip Abduction with Ball on Wall  - 1 x daily - 7 x weekly - 1 sets - 5 reps - 5 hold - Long Sitting Isometric Ankle Inversion in Dorsiflexion with Ball at Wall  - 1 x daily - 7 x weekly - 1 sets - 10 reps - 5 hold - Long Sitting Isometric Ankle Eversion in Dorsiflexion with Ball at Wall  - 1 x daily - 7 x weekly - 3 sets - 10 reps - 5 hold - Sit to Stand  - 1 x daily - 7 x weekly - 1 sets - 10 reps ASSESSMENT:  CLINICAL IMPRESSION: Patient was able to complete all tasks today with minimal pain.  She has palpable crepitus with [place] knee extension but min pain.  She would benefit from continued skilled PT for quad rehab and hip strengthening.    OBJECTIVE IMPAIRMENTS Abnormal gait, decreased activity tolerance, decreased balance, decreased endurance, decreased mobility, difficulty walking, decreased ROM, decreased strength, hypomobility, increased muscle spasms, impaired flexibility, improper body mechanics, postural  dysfunction, and pain.   ACTIVITY LIMITATIONS standing, stairs, and locomotion level  PARTICIPATION LIMITATIONS: community activity  PERSONAL FACTORS Age and Time since onset of injury/illness/exacerbation are also affecting patient's functional outcome.   REHAB POTENTIAL: Good  CLINICAL DECISION MAKING: Evolving/moderate complexity  EVALUATION COMPLEXITY: Moderate   GOALS: Goals reviewed with patient? Yes  SHORT TERM GOALS: Target date: 12/24/2021  Pt will be independent with her initial HEP to improve LE strength and flexibility.  Baseline: Goal status: INITIAL    LONG TERM GOALS: Target date: 01/21/2022   Pt will have improved Rt gastroc strength, evident by her ability to complete 15 single leg heel raises.  Baseline:  Goal status: INITIAL  2.  Pt will have improved hip strength to 4/5 MMT. Baseline:  Goal status: INITIAL  3.  Pt will have increase in ankle dorsiflexion to atleast 10 deg for improved foot clearance during ambulation.  Baseline:  Goal status: INITIAL  4.  Pt will be able to descend clinic steps with adequate eccentric control and without increase in foot/knee pain. Baseline:  Goal status: INITIAL  5.  Pt will report atleast 50% improvement in her Rt foot pain from the start of PT. Baseline:  Goal status: INITIAL    PLAN: PT FREQUENCY: 2x/week  PT DURATION: 6 weeks  PLANNED INTERVENTIONS: Therapeutic exercises, Therapeutic activity, Neuromuscular re-education, Balance training, Gait training, Patient/Family education, Joint mobilization, Stair training, Aquatic Therapy, Dry Needling, Cryotherapy, Taping, and Manual therapy  PLAN FOR NEXT SESSION: discuss shoe wear/Fleet Feet referral (give coupon); familiar with Nu-step (liked in the past); info on aquatic PT possibly 1x/week with land PT, progress glute strength, f/u on foot pain after pain meds wore off and add to HEP: ankle DF ROM, ankle eversion strength, STM as needed peroneals/gastrocs if  needed   Lundy Cozart B. Christabell Loseke, PT 12/16/21 11:30 PM  [facility] [address] [place], [place] [address] Phone # [phone] Fax [phone]

## 2021-12-29 ENCOUNTER — Ambulatory Visit: Payer: PPO | Admitting: Physical Therapy

## 2021-12-29 DIAGNOSIS — E7800 Pure hypercholesterolemia, unspecified: Secondary | ICD-10-CM | POA: Diagnosis not present

## 2021-12-29 DIAGNOSIS — R7301 Impaired fasting glucose: Secondary | ICD-10-CM | POA: Diagnosis not present

## 2021-12-29 DIAGNOSIS — I10 Essential (primary) hypertension: Secondary | ICD-10-CM | POA: Diagnosis not present

## 2021-12-29 DIAGNOSIS — Z0000 Encounter for general adult medical examination without abnormal findings: Secondary | ICD-10-CM | POA: Diagnosis not present

## 2021-12-30 ENCOUNTER — Ambulatory Visit: Payer: PPO | Attending: Family Medicine | Admitting: Physical Therapy

## 2021-12-30 ENCOUNTER — Encounter: Payer: Self-pay | Admitting: Physical Therapy

## 2021-12-30 DIAGNOSIS — M25671 Stiffness of right ankle, not elsewhere classified: Secondary | ICD-10-CM | POA: Insufficient documentation

## 2021-12-30 DIAGNOSIS — M79605 Pain in left leg: Secondary | ICD-10-CM | POA: Insufficient documentation

## 2021-12-30 DIAGNOSIS — M6281 Muscle weakness (generalized): Secondary | ICD-10-CM | POA: Diagnosis not present

## 2021-12-30 DIAGNOSIS — M79604 Pain in right leg: Secondary | ICD-10-CM | POA: Insufficient documentation

## 2021-12-30 DIAGNOSIS — R262 Difficulty in walking, not elsewhere classified: Secondary | ICD-10-CM | POA: Insufficient documentation

## 2021-12-30 NOTE — Therapy (Signed)
OUTPATIENT PHYSICAL THERAPY LOWER EXTREMITY PROGRESS NOTE   Patient Name: Jamie Glover MRN: [id number] DOB:Feb 28, 1956, 66 y.o., female Today's Date: 12/30/2021   PT End of Session - 12/30/21 0935     Visit Number 4    Date for PT Re-Evaluation 01/20/22    Authorization Type [employer]    Authorization Time Period 12/08/21 to 01/20/22    PT Start Time 0933    PT Stop Time 1016    PT Time Calculation (min) 43 min    Activity Tolerance Patient tolerated treatment well    Behavior During Therapy [facility] for tasks assessed/performed              Past Medical History:  Diagnosis Date   Arthritis    Hypertension    OSA (obstructive sleep apnea) 2013   Past Surgical History:  Procedure Laterality Date   BREAST BIOPSY     c-sections     CHOLECYSTECTOMY     CHOLECYSTECTOMY, LAPAROSCOPIC  1996   right knee surgery     Patient Active Problem List   Diagnosis Date Noted   Peroneal tendonitis, right 09/15/2021   Unilateral primary osteoarthritis, left knee 04/14/2020   Primary osteoarthritis of left knee 01/15/2019   Unilateral primary osteoarthritis, right knee 12/26/2017   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 ear 12/19/2017   Eustachian tube dysfunction, left 12/19/2017   Chronic serous otitis media of left ear 12/19/2017   Tinnitus 10/01/2011   Vitamin D deficiency 10/01/2011   GERD (gastroesophageal reflux disease) 10/01/2011   Neck pain 10/01/2011   HTN (hypertension) 10/01/2011   Sleep apnea 09/29/2011   BMI 33.0-33.9,adult 09/29/2011    PCP: Milagros Evener, MD  REFERRING PROVIDER: Faustino Congress, NP  REFERRING DIAG: Pain in Rt knee, Pain in Rt leg, Pain in Lt leg   THERAPY DIAG:  Pain in right leg  Pain in left leg  Stiffness of right ankle, not elsewhere classified  Muscle weakness (generalized)  Rationale for Evaluation and Treatment Rehabilitation  ONSET DATE: 2 years  ago  SUBJECTIVE:   SUBJECTIVE STATEMENT: I went on vacation and we were in a house with 3 flights of stairs.  That was a lot of exercise and did have some pain with that.  No more spontaneous pain, no awakening from pain.  I stopped the [facility] and did awaken with foot pain last night.  Minimal pain right now.    PERTINENT HISTORY:  Arthritis Rt knee, not much improvement with PT  Pt states that about 2 years ago, she was walking and felt a pop in her Rt foot. Some time went by and her foot was feeling a little better. She went on a trip and did a lot of walking which seemed to cause a flare-up in her foot pain. In January, she saw a podiatrist and was put in a boot for 3 weeks and had no improvement. She was sent to PT but was 1 of 4 pts at a time and felt that after 8 weeks she was not seeing much improvement. While at PT, she also noticed her Lt knee/outer leg was bothering her during her sessions. She has been on Mobic the last week and feels that her pain is better than usual because of this.   PAIN:  Are you having pain? Yes: NPRS scale: 0-1/10 Pain location: Rt lateral foot Pain description: sore Aggravating factors: walking, heel raises, standing on 1 leg Relieving factors: resting  off her feet  PRECAUTIONS: None  WEIGHT BEARING RESTRICTIONS No  FALLS:  Has patient fallen in last 6 months? No  LIVING ENVIRONMENT: Lives with: lives with their family Lives in: House/apartment Stairs: Yes: External: 5 steps;   Has following equipment at home: None  OCCUPATION: retired Marine scientist   PLOF: Independent  PATIENT GOALS be able to walk without increase in Rt and Lt LE pain    OBJECTIVE:   DIAGNOSTIC FINDINGS: MRI Rt foot  PATIENT SURVEYS:  FOTO 55  COGNITION:  Overall cognitive status: Within functional limits for tasks assessed     SENSATION: Denies numbness/tingling  EDEMA:  None noticed   POSTURE:  LE adduction, foot eversion  PALPATION: Tenderness over  lateral aspect of the foot, at peroneal tendon insertion  LOWER EXTREMITY ROM:  Passive ROM Right eval Left eval  Hip flexion    Hip extension    Hip abduction    Hip adduction    Hip internal rotation    Hip external rotation    Knee flexion    Knee extension    Ankle dorsiflexion 0 0  Ankle plantarflexion    Ankle inversion 25 (+pain)  30  Ankle eversion     (Blank rows = not tested)  LOWER EXTREMITY MMT:  MMT Right eval Left eval  Hip flexion    Hip extension 3/5 3/5  Hip abduction 3/5 3/5  Hip adduction    Hip internal rotation    Hip external rotation    Knee flexion 4/5 4/5  Knee extension 5/5 5/5  Ankle dorsiflexion 5/5   Ankle plantarflexion Unable to complete full range heel raise X10 reps   Ankle inversion 5/5   Ankle eversion 5/5    (Blank rows = not tested)  LOWER EXTREMITY SPECIAL TESTS:    FUNCTIONAL TESTS:    GAIT: Distance walked:  Assistive device utilized:  Level of assistance:  Comments: Pt ambulates with decreased glute activation, (+) trendelenburg bilateral, hip adduction, foot eversion  Stairs: (+) trendelenburg, poor eccentric control bilaterally when descending steps    TODAY'S TREATMENT: 8/2: NuStep L5 x 6' PT present to discuss symptoms and monitor Seated piriformis stretch (fig 4 and knee hug each) 20-30 sec hold right/left 2x Sit to stand with 10 lb kb 2 x 10, added ball b/w knees 2nd set Seated LAQ 4lb 2x10 bil Hip flexor stretch on 2nd step reach up and over 5x5" each side Seated Rt ankle inversion ball squeeze ball b/w feet and eversion ball press against wall 10x5" holds each Seated hamstring curl x 20 each LE with red tband Seated single clam x 20 with blue loop above knees Rt/Lt Lateral step up 4" Rt x 10 with bil UE support Standing hip abduction bil 2x10 with bil UE support Rt LE on 2" riser step down heel taps x 10 with single UE support (very challenging and some Rt knee pain) SAQ over bolster with ball squeeze  4lb ankle weight x 10, focus on eccentric lowering   7/19: NuStep x 5 min level 3 Seated piriformis stretch 20-30 sec hold right/left 2x Sit to stand with 10 lb kb 2 x 10 (initially with right knee pain, added pad under right heel and this improved) Seated LAQ 2 x 10 with 4 lb ankle weights (palpable crepitus on right but no pain) Seated hip abduction x 20 with blue loop  Seated hamstring curl x 20 each LE with red tband Quad progression in supine: quad set x 20, TKE over  noodle x 20,  SAQ over white foam roller x 20 with 4 lb,  SAQ over larger blue bolster x 20 with 4 lb Seated towel scrunch x 10 Seated inv/ev towel push x 10  7/13: Seated piriformis stretch 20-30 sec hold right/left 2x 2nd step hip flexor stretch with UE elevation and UE reach over for ITB stretch (added towel lift under right heel for relief of knee pain) Pink ball on wall hip abduction isometric 5 sec hold 10x right/left Seated pink ball ankle inversion and eversion isometrics 5 sec hold 10x each Sit to stand without UE support 5x, with eyes closed 5x, on blue foam 3x  Neuromuscular re-ed: activation glute med, activating quads, foot intrinsics, peroneals Therapeutic activity: walking, standing, sit to stand   PATIENT EDUCATION:  Education details: eval findings/POC; HEP Person educated: Patient Education method: Explanation Education comprehension: returned demonstration   HOME EXERCISE PROGRAM: Access Code: [email or web address] URL: [email or web address] Date: 12/30/2021 Prepared by: Venetia Night Pegi Milazzo  Exercises - Supine Bridge with Resistance Band  - 1 x daily - 7 x weekly - 2-3 sets - 10 reps - Sidelying Hip Abduction  - 1 x daily - 7 x weekly - 1-2 sets - 10 reps - Supine Piriformis Stretch with Foot on Ground  - 1 x daily - 7 x weekly - 2 sets - 30 sec hold - Standing Knee Flexion Stretch on Step  - 1 x daily - 7 x weekly - 1 sets - 10 reps - Standing Isometric Hip Abduction with Ball on Wall  - 1  x daily - 7 x weekly - 1 sets - 5 reps - 5 hold - Long Sitting Isometric Ankle Inversion in Dorsiflexion with Ball at [place]  - 1 x daily - 7 x weekly - 1 sets - 10 reps - 5 hold - Long Sitting Isometric Ankle Eversion in Dorsiflexion with Ball at [place]  - 1 x daily - 7 x weekly - 3 sets - 10 reps - 5 hold - Sit to Stand  - 1 x daily - 7 x weekly - 1 sets - 10 reps - Seated Hip Abduction with Resistance  - 1 x daily - 7 x weekly - 1 sets - 20 reps - Seated Hamstring Curls with Resistance  - 1 x daily - 7 x weekly - 2 sets - 10 reps - Seated Long Arc Quad with Ankle Weight  - 1 x daily - 7 x weekly - 2 sets - 10 reps - Standing Hip Abduction with Counter Support  - 1 x daily - 7 x weekly - 2 sets - 10 reps - Supine Knee Extension Strengthening  - 1 x daily - 7 x weekly - 2 sets - 10 reps ASSESSMENT:  CLINICAL IMPRESSION: Patient was able to progress strengthening today and did request additional exercises for HEP.  Her greatest challenge is single leg eccentric control on Rt when descending stairs.  Attempted 2" step down heel taps and Pt was determined to perform x 10 but did have some pain and great challenge with this.  VC used for standing hip abd to avoid lateral trunk lean today.    OBJECTIVE IMPAIRMENTS Abnormal gait, decreased activity tolerance, decreased balance, decreased endurance, decreased mobility, difficulty walking, decreased ROM, decreased strength, hypomobility, increased muscle spasms, impaired flexibility, improper body mechanics, postural dysfunction, and pain.   ACTIVITY LIMITATIONS standing, stairs, and locomotion level  PARTICIPATION LIMITATIONS: community activity  PERSONAL FACTORS Age and Time since onset of injury/illness/exacerbation are also affecting  patient's functional outcome.   REHAB POTENTIAL: Good  CLINICAL DECISION MAKING: Evolving/moderate complexity  EVALUATION COMPLEXITY: Moderate   GOALS: Goals reviewed with patient? Yes  SHORT TERM GOALS: Target  date: 12/24/2021  Pt will be independent with her initial HEP to improve LE strength and flexibility.  Baseline: Goal status: ongoing    LONG TERM GOALS: Target date: 01/21/2022   Pt will have improved Rt gastroc strength, evident by her ability to complete 15 single leg heel raises.  Baseline:  Goal status: INITIAL  2.  Pt will have improved hip strength to 4/5 MMT. Baseline:  Goal status: INITIAL  3.  Pt will have increase in ankle dorsiflexion to atleast 10 deg for improved foot clearance during ambulation.  Baseline:  Goal status: INITIAL  4.  Pt will be able to descend clinic steps with adequate eccentric control and without increase in foot/knee pain. Baseline:  Goal status: INITIAL  5.  Pt will report atleast 50% improvement in her Rt foot pain from the start of PT. Baseline:  Goal status: INITIAL    PLAN: PT FREQUENCY: 2x/week  PT DURATION: 6 weeks  PLANNED INTERVENTIONS: Therapeutic exercises, Therapeutic activity, Neuromuscular re-education, Balance training, Gait training, Patient/Family education, Joint mobilization, Stair training, Aquatic Therapy, Dry Needling, Cryotherapy, Taping, and Manual therapy  PLAN FOR NEXT SESSION: discuss shoe wear/Fleet Feet referral (give coupon); familiar with Nu-step (liked in the past); info on aquatic PT possibly 1x/week with land PT, progress glute strength, f/u on foot pain after pain meds wore off and add to HEP: ankle DF ROM, ankle eversion strength, STM as needed peroneals/gastrocs if needed   Baruch Merl, PT 12/30/21 1:22 PM  [place] [address], [address] [place], [place] [address] Phone # [phone] Fax [phone]

## 2022-01-01 ENCOUNTER — Ambulatory Visit: Payer: PPO | Admitting: Physical Therapy

## 2022-01-01 DIAGNOSIS — M79605 Pain in left leg: Secondary | ICD-10-CM

## 2022-01-01 DIAGNOSIS — M25671 Stiffness of right ankle, not elsewhere classified: Secondary | ICD-10-CM

## 2022-01-01 DIAGNOSIS — M79604 Pain in right leg: Secondary | ICD-10-CM

## 2022-01-01 DIAGNOSIS — M6281 Muscle weakness (generalized): Secondary | ICD-10-CM

## 2022-01-01 DIAGNOSIS — R262 Difficulty in walking, not elsewhere classified: Secondary | ICD-10-CM

## 2022-01-01 NOTE — Therapy (Signed)
OUTPATIENT PHYSICAL THERAPY LOWER EXTREMITY PROGRESS NOTE   Patient Name: Jamie Glover MRN: [id number] DOB:01/14/1956, 66 y.o., female Today's Date: 01/01/2022   PT End of Session - 01/01/22 1103     Visit Number 5    Date for PT Re-Evaluation 01/20/22    Authorization Type [employer]    Authorization Time Period 12/08/21 to 01/20/22    PT Start Time 1101    PT Stop Time 1140    PT Time Calculation (min) 39 min    Activity Tolerance Patient tolerated treatment well              Past Medical History:  Diagnosis Date   Arthritis    Hypertension    OSA (obstructive sleep apnea) 2013   Past Surgical History:  Procedure Laterality Date   BREAST BIOPSY     c-sections     CHOLECYSTECTOMY     CHOLECYSTECTOMY, LAPAROSCOPIC  1996   right knee surgery     Patient Active Problem List   Diagnosis Date Noted   Peroneal tendonitis, right 09/15/2021   Unilateral primary osteoarthritis, left knee 04/14/2020   Primary osteoarthritis of left knee 01/15/2019   Unilateral primary osteoarthritis, right knee 12/26/2017   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 ear 12/19/2017   Eustachian tube dysfunction, left 12/19/2017   Chronic serous otitis media of left ear 12/19/2017   Tinnitus 10/01/2011   Vitamin D deficiency 10/01/2011   GERD (gastroesophageal reflux disease) 10/01/2011   Neck pain 10/01/2011   HTN (hypertension) 10/01/2011   Sleep apnea 09/29/2011   BMI 33.0-33.9,adult 09/29/2011    PCP: Milagros Evener, MD  REFERRING PROVIDER: Faustino Congress, NP  REFERRING DIAG: Pain in Rt knee, Pain in Rt leg, Pain in Lt leg   THERAPY DIAG:  Pain in right leg  Pain in left leg  Stiffness of right ankle, not elsewhere classified  Muscle weakness (generalized)  Difficulty in walking, not elsewhere classified  Rationale for Evaluation and Treatment Rehabilitation  ONSET DATE: 2 years ago  SUBJECTIVE:    SUBJECTIVE STATEMENT: I had some questions about some ex's last week.  Going down steps I feel like I plop down to the next step.  Gone off statins now.   PERTINENT HISTORY:  Arthritis Rt knee, not much improvement with PT  Pt states that about 2 years ago, she was walking and felt a pop in her Rt foot. Some time went by and her foot was feeling a little better. She went on a trip and did a lot of walking which seemed to cause a flare-up in her foot pain. In January, she saw a podiatrist and was put in a boot for 3 weeks and had no improvement. She was sent to PT but was 1 of 4 pts at a time and felt that after 8 weeks she was not seeing much improvement. While at PT, she also noticed her Lt knee/outer leg was bothering her during her sessions. She has been on Mobic the last week and feels that her pain is better than usual because of this.   PAIN:  Are you having pain? Yes: NPRS scale: 0-1/10 Pain location: Rt lateral foot 5th toe to heel  Pain description: sore Aggravating factors: walking, heel raises, standing on 1 leg Relieving factors: resting off her feet  PRECAUTIONS: None  WEIGHT BEARING RESTRICTIONS No  FALLS:  Has patient fallen in last 6 months? No  LIVING ENVIRONMENT: Lives with: lives  with their family Lives in: House/apartment Stairs: Yes: External: 5 steps;   Has following equipment at home: None  OCCUPATION: retired Marine scientist   PLOF: Independent  PATIENT GOALS be able to walk without increase in Rt and Lt LE pain    OBJECTIVE:   DIAGNOSTIC FINDINGS: MRI Rt foot  PATIENT SURVEYS:  FOTO 55  COGNITION:  Overall cognitive status: Within functional limits for tasks assessed     SENSATION: Denies numbness/tingling  EDEMA:  None noticed   POSTURE:  LE adduction, foot eversion  PALPATION: Tenderness over lateral aspect of the foot, at peroneal tendon insertion  LOWER EXTREMITY ROM:  Passive ROM Right eval Left eval  Hip flexion    Hip extension     Hip abduction    Hip adduction    Hip internal rotation    Hip external rotation    Knee flexion    Knee extension    Ankle dorsiflexion 0 0  Ankle plantarflexion    Ankle inversion 25 (+pain)  30  Ankle eversion     (Blank rows = not tested)  LOWER EXTREMITY MMT:  MMT Right eval Left eval 8/4  Hip flexion     Hip extension 3/5 3/5 4 right/left  Hip abduction 3/5 3/5 4 right/left  Hip adduction     Hip internal rotation     Hip external rotation     Knee flexion 4/5 4/5 4+ right/left  Knee extension 5/5 5/5 4 right/ 5 left  Ankle dorsiflexion 5/5    Ankle plantarflexion Unable to complete full range heel raise X10 reps    Ankle inversion 5/5    Ankle eversion 5/5     (Blank rows = not tested)  LOWER EXTREMITY SPECIAL TESTS:    FUNCTIONAL TESTS:    GAIT: Distance walked:  Assistive device utilized:  Level of assistance:  Comments: Pt ambulates with decreased glute activation, (+) trendelenburg bilateral, hip adduction, foot eversion  Stairs: (+) trendelenburg, poor eccentric control bilaterally when descending steps    TODAY'S TREATMENT: 8/4: NuStep L5 x 6' PT present to discuss symptoms  Review of HEP to answer questions Seated piriformis stretch seated Hip abduction with ball against wall 5x each way 2 inch step downs with right toe touch to floor 10x;  attempted left side but too painful so discontinued Seated right  LAQ 5lb 2x10 (some patellar tendon discomfort but mild) Seated quad isometrics 5 sec hold 10x At the railing light green loop on toes side stepping to activate peroneals Leg press seat 8 bil 60# 15x; single leg 30# 10x each side Neuromusc re-ed: glute medius, quads, peroneals Therapeutic activity: curbs, steps, standing, walking   8/2: NuStep L5 x 6' PT present to discuss symptoms and monitor Seated piriformis stretch (fig 4 and knee hug each) 20-30 sec hold right/left 2x Sit to stand with 10 lb kb 2 x 10, added ball b/w knees 2nd  set Seated LAQ 4lb 2x10 bil Hip flexor stretch on 2nd step reach up and over 5x5" each side Seated Rt ankle inversion ball squeeze ball b/w feet and eversion ball press against wall 10x5" holds each Seated hamstring curl x 20 each LE with red tband Seated single clam x 20 with blue loop above knees Rt/Lt Lateral step up 4" Rt x 10 with bil UE support Standing hip abduction bil 2x10 with bil UE support Rt LE on 2" riser step down heel taps x 10 with single UE support (very challenging and some Rt knee pain) SAQ  over bolster with ball squeeze 4lb ankle weight x 10, focus on eccentric lowering   7/19: NuStep x 5 min level 3 Seated piriformis stretch 20-30 sec hold right/left 2x Sit to stand with 10 lb kb 2 x 10 (initially with right knee pain, added pad under right heel and this improved) Seated LAQ 2 x 10 with 4 lb ankle weights (palpable crepitus on right but no pain) Seated hip abduction x 20 with blue loop  Seated hamstring curl x 20 each LE with red tband Quad progression in supine: quad set x 20, TKE over noodle x 20,  SAQ over white foam roller x 20 with 4 lb,  SAQ over larger blue bolster x 20 with 4 lb Seated towel scrunch x 10 Seated inv/ev towel push x 10   PATIENT EDUCATION:  Education details: eval findings/POC; HEP Person educated: Patient Education method: Explanation Education comprehension: returned demonstration   HOME EXERCISE PROGRAM: Access Code: [email or web address] URL: [email or web address] Date: 01/01/2022 Prepared by: Ruben Im  Exercises - Supine Bridge with Resistance Band  - 1 x daily - 7 x weekly - 2-3 sets - 10 reps - Sidelying Hip Abduction  - 1 x daily - 7 x weekly - 1-2 sets - 10 reps - Supine Piriformis Stretch with Foot on Ground  - 1 x daily - 7 x weekly - 2 sets - 30 sec hold - Standing Knee Flexion Stretch on Step  - 1 x daily - 7 x weekly - 1 sets - 10 reps - Standing Isometric Hip Abduction with Ball on Wall  - 1 x daily - 7 x  weekly - 1 sets - 5 reps - 5 hold - Long Sitting Isometric Ankle Inversion in Dorsiflexion with Ball at [place]  - 1 x daily - 7 x weekly - 1 sets - 10 reps - 5 hold - Long Sitting Isometric Ankle Eversion in Dorsiflexion with Ball at [place]  - 1 x daily - 7 x weekly - 3 sets - 10 reps - 5 hold - Sit to Stand  - 1 x daily - 7 x weekly - 1 sets - 10 reps - Seated Hip Abduction with Resistance  - 1 x daily - 7 x weekly - 1 sets - 20 reps - Seated Hamstring Curls with Resistance  - 1 x daily - 7 x weekly - 2 sets - 10 reps - Seated Long Arc Quad with Ankle Weight  - 1 x daily - 7 x weekly - 2 sets - 10 reps - Standing Hip Abduction with Counter Support  - 1 x daily - 7 x weekly - 2 sets - 10 reps - Supine Knee Extension Strengthening  - 1 x daily - 7 x weekly - 2 sets - 10 reps - Forward Step Down Touch with Toe  - 1 x daily - 7 x weekly - 1 sets - 10 reps - Seated Quad Set  - 1 x daily - 7 x weekly - 1 sets - 10 reps - 5 hold - Side Stepping with Resistance at Feet  - 1 x daily - 7 x weekly - 1 sets - 5 reps  ASSESSMENT:  CLINICAL IMPRESSION: Right knee pain and valgus with 2 inch step downs so modified to either LAQ or seated quad isometrics.  She is able to do 2 inch step downs on left without pain.  Initiated trial on leg press double and single leg which was not painful on either side.  Muscular fatigue but not  painful with peroneal activation ex's with band.  Therapist monitoring response and modifying treatment accordingly.    OBJECTIVE IMPAIRMENTS Abnormal gait, decreased activity tolerance, decreased balance, decreased endurance, decreased mobility, difficulty walking, decreased ROM, decreased strength, hypomobility, increased muscle spasms, impaired flexibility, improper body mechanics, postural dysfunction, and pain.   ACTIVITY LIMITATIONS standing, stairs, and locomotion level  PARTICIPATION LIMITATIONS: community activity  PERSONAL FACTORS Age and Time since onset of  injury/illness/exacerbation are also affecting patient's functional outcome.   REHAB POTENTIAL: Good  CLINICAL DECISION MAKING: Evolving/moderate complexity  EVALUATION COMPLEXITY: Moderate   GOALS: Goals reviewed with patient? Yes  SHORT TERM GOALS: Target date: 12/24/2021  Pt will be independent with her initial HEP to improve LE strength and flexibility.  Baseline: Goal status: met 8/4  LONG TERM GOALS: Target date: 01/21/2022   Pt will have improved Rt gastroc strength, evident by her ability to complete 15 single leg heel raises.  Baseline:  Goal status: INITIAL  2.  Pt will have improved hip strength to 4/5 MMT. Baseline:  Goal status: INITIAL  3.  Pt will have increase in ankle dorsiflexion to atleast 10 deg for improved foot clearance during ambulation.  Baseline:  Goal status: INITIAL  4.  Pt will be able to descend clinic steps with adequate eccentric control and without increase in foot/knee pain. Baseline:  Goal status: INITIAL  5.  Pt will report atleast 50% improvement in her Rt foot pain from the start of PT. Baseline:  Goal status: INITIAL    PLAN: PT FREQUENCY: 2x/week  PT DURATION: 6 weeks  PLANNED INTERVENTIONS: Therapeutic exercises, Therapeutic activity, Neuromuscular re-education, Balance training, Gait training, Patient/Family education, Joint mobilization, Stair training, Aquatic Therapy, Dry Needling, Cryotherapy, Taping, and Manual therapy  PLAN FOR NEXT SESSION:  Nu-step; leg press;  info on aquatic PT possibly 1x/week with land PT, progress glute, quad and peroneal strength,  ankle DF ROM, STM as needed peroneals/gastrocs if needed   Ruben Im, PT 01/01/22 12:09 PM Phone: [phone] Fax: [place] [address], [address] [place], [place] [address] Phone # [phone] Fax [phone]

## 2022-01-05 ENCOUNTER — Ambulatory Visit: Payer: PPO

## 2022-01-05 DIAGNOSIS — M79605 Pain in left leg: Secondary | ICD-10-CM

## 2022-01-05 DIAGNOSIS — M6281 Muscle weakness (generalized): Secondary | ICD-10-CM

## 2022-01-05 DIAGNOSIS — M79604 Pain in right leg: Secondary | ICD-10-CM

## 2022-01-05 DIAGNOSIS — R262 Difficulty in walking, not elsewhere classified: Secondary | ICD-10-CM

## 2022-01-05 DIAGNOSIS — M25671 Stiffness of right ankle, not elsewhere classified: Secondary | ICD-10-CM

## 2022-01-05 NOTE — Therapy (Signed)
OUTPATIENT PHYSICAL THERAPY LOWER EXTREMITY TREATMENT NOTE   Patient Name: Jamie Glover MRN: [id number] DOB:11-01-1955, 66 y.o., female Today's Date: 01/05/2022   PT End of Session - 01/05/22 1016     Visit Number 6    Date for PT Re-Evaluation 01/20/22    Authorization Type [employer]    Authorization Time Period 12/08/21 to 01/20/22    PT Start Time 1016    PT Stop Time 1100    PT Time Calculation (min) 44 min    Activity Tolerance Patient tolerated treatment well    Behavior During Therapy [facility] for tasks assessed/performed              Past Medical History:  Diagnosis Date   Arthritis    Hypertension    OSA (obstructive sleep apnea) 2013   Past Surgical History:  Procedure Laterality Date   BREAST BIOPSY     c-sections     CHOLECYSTECTOMY     CHOLECYSTECTOMY, LAPAROSCOPIC  1996   right knee surgery     Patient Active Problem List   Diagnosis Date Noted   Peroneal tendonitis, right 09/15/2021   Unilateral primary osteoarthritis, left knee 04/14/2020   Primary osteoarthritis of left knee 01/15/2019   Unilateral primary osteoarthritis, right knee 12/26/2017   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 ear 12/19/2017   Eustachian tube dysfunction, left 12/19/2017   Chronic serous otitis media of left ear 12/19/2017   Tinnitus 10/01/2011   Vitamin D deficiency 10/01/2011   GERD (gastroesophageal reflux disease) 10/01/2011   Neck pain 10/01/2011   HTN (hypertension) 10/01/2011   Sleep apnea 09/29/2011   BMI 33.0-33.9,adult 09/29/2011    PCP: Milagros Evener, MD  REFERRING PROVIDER: Faustino Congress, NP  REFERRING DIAG: Pain in Rt knee, Pain in Rt leg, Pain in Lt leg   THERAPY DIAG:  Pain in right leg  Pain in left leg  Stiffness of right ankle, not elsewhere classified  Muscle weakness (generalized)  Difficulty in walking, not elsewhere classified  Rationale for Evaluation and Treatment  Rehabilitation  ONSET DATE: 2 years ago  SUBJECTIVE:   SUBJECTIVE STATEMENT: Patient reports she woke up with foot pain at 6/10.  Unsure if it was necessarily a spasm.  Pain today at 1/10.  Has 5 steps in/out of house daily.  She a walking group that she walks with at church 1 time per week and she walks with a friend in a neighborhood 1 time per week.     PERTINENT HISTORY:  Arthritis Rt knee, not much improvement with PT  Pt states that about 2 years ago, she was walking and felt a pop in her Rt foot. Some time went by and her foot was feeling a little better. She went on a trip and did a lot of walking which seemed to cause a flare-up in her foot pain. In January, she saw a podiatrist and was put in a boot for 3 weeks and had no improvement. She was sent to PT but was 1 of 4 pts at a time and felt that after 8 weeks she was not seeing much improvement. While at PT, she also noticed her Lt knee/outer leg was bothering her during her sessions. She has been on Mobic the last week and feels that her pain is better than usual because of this.   PAIN:  Are you having pain? Yes: NPRS scale: 0-1/10 Pain location: Rt lateral foot 5th toe to heel  Pain description: sore Aggravating factors: walking, heel raises, standing on 1 leg Relieving factors: resting off her feet  PRECAUTIONS: None  WEIGHT BEARING RESTRICTIONS No  FALLS:  Has patient fallen in last 6 months? No  LIVING ENVIRONMENT: Lives with: lives with their family Lives in: House/apartment Stairs: Yes: External: 5 steps;   Has following equipment at home: None  OCCUPATION: retired Marine scientist   PLOF: Independent  PATIENT GOALS be able to walk without increase in Rt and Lt LE pain    OBJECTIVE:   DIAGNOSTIC FINDINGS: MRI Rt foot  PATIENT SURVEYS:  FOTO 55  COGNITION:  Overall cognitive status: Within functional limits for tasks assessed     SENSATION: Denies numbness/tingling  EDEMA:  None noticed   POSTURE:  LE  adduction, foot eversion  PALPATION: Tenderness over lateral aspect of the foot, at peroneal tendon insertion  LOWER EXTREMITY ROM:  Passive ROM Right eval Left eval  Hip flexion    Hip extension    Hip abduction    Hip adduction    Hip internal rotation    Hip external rotation    Knee flexion    Knee extension    Ankle dorsiflexion 0 0  Ankle plantarflexion    Ankle inversion 25 (+pain)  30  Ankle eversion     (Blank rows = not tested)  LOWER EXTREMITY MMT:  MMT Right eval Left eval 8/4  Hip flexion     Hip extension 3/5 3/5 4 right/left  Hip abduction 3/5 3/5 4 right/left  Hip adduction     Hip internal rotation     Hip external rotation     Knee flexion 4/5 4/5 4+ right/left  Knee extension 5/5 5/5 4 right/ 5 left  Ankle dorsiflexion 5/5    Ankle plantarflexion Unable to complete full range heel raise X10 reps    Ankle inversion 5/5    Ankle eversion 5/5     (Blank rows = not tested)  LOWER EXTREMITY SPECIAL TESTS:    FUNCTIONAL TESTS:    GAIT: Distance walked:  Assistive device utilized:  Level of assistance:  Comments: Pt ambulates with decreased glute activation, (+) trendelenburg bilateral, hip adduction, foot eversion  Stairs: (+) trendelenburg, poor eccentric control bilaterally when descending steps    TODAY'S TREATMENT: 8/8: NuStep L5 x 5' PT present to discuss symptoms  Leg press 50# both with vc's for correct alignment, single leg press 30# vc's for alignment x 20 each Lateral band walks x 3 laps of 10 steps blue loop Seated right  LAQ 5lb x 20 both (no patellar tendon pain today) March x 20 with 5 lb both Standing with slight knee flexion in front of mirror, blue loop hip ER x 20 Sit to stand with blue loop for encouraging hip ER 2 x 10 Supine quad progression as follows: Quad sets x 20 both TKE with small green noodle x 20 both SAQ with white foam roller with 5lb ankle weight x 20 both SAQ with teal bolster x 20 with 5 lb  both  8/4: NuStep L5 x 6' PT present to discuss symptoms  Review of HEP to answer questions Seated piriformis stretch seated Hip abduction with ball against wall 5x each way 2 inch step downs with right toe touch to floor 10x;  attempted left side but too painful so discontinued Seated right  LAQ 5lb 2x10 (some patellar tendon discomfort but mild) Seated quad isometrics 5 sec hold 10x At the railing light green loop on toes side  stepping to activate peroneals Leg press seat 8 bil 60# 15x; single leg 30# 10x each side Neuromusc re-ed: glute medius, quads, peroneals Therapeutic activity: curbs, steps, standing, walking   8/2: NuStep L5 x 6' PT present to discuss symptoms and monitor Seated piriformis stretch (fig 4 and knee hug each) 20-30 sec hold right/left 2x Sit to stand with 10 lb kb 2 x 10, added ball b/w knees 2nd set Seated LAQ 4lb 2x10 bil Hip flexor stretch on 2nd step reach up and over 5x5" each side Seated Rt ankle inversion ball squeeze ball b/w feet and eversion ball press against wall 10x5" holds each Seated hamstring curl x 20 each LE with red tband Seated single clam x 20 with blue loop above knees Rt/Lt Lateral step up 4" Rt x 10 with bil UE support Standing hip abduction bil 2x10 with bil UE support Rt LE on 2" riser step down heel taps x 10 with single UE support (very challenging and some Rt knee pain) SAQ over bolster with ball squeeze 4lb ankle weight x 10, focus on eccentric lowering   PATIENT EDUCATION:  Education details: eval findings/POC; HEP Person educated: Patient Education method: Explanation Education comprehension: returned demonstration   HOME EXERCISE PROGRAM: Access Code: [email or web address] URL: [email or web address] Date: 01/01/2022 Prepared by: Ruben Im  Exercises - Supine Bridge with Resistance Band  - 1 x daily - 7 x weekly - 2-3 sets - 10 reps - Sidelying Hip Abduction  - 1 x daily - 7 x weekly - 1-2 sets - 10 reps -  Supine Piriformis Stretch with Foot on Ground  - 1 x daily - 7 x weekly - 2 sets - 30 sec hold - Standing Knee Flexion Stretch on Step  - 1 x daily - 7 x weekly - 1 sets - 10 reps - Standing Isometric Hip Abduction with Ball on Wall  - 1 x daily - 7 x weekly - 1 sets - 5 reps - 5 hold - Long Sitting Isometric Ankle Inversion in Dorsiflexion with Ball at [place]  - 1 x daily - 7 x weekly - 1 sets - 10 reps - 5 hold - Long Sitting Isometric Ankle Eversion in Dorsiflexion with Ball at [place]  - 1 x daily - 7 x weekly - 3 sets - 10 reps - 5 hold - Sit to Stand  - 1 x daily - 7 x weekly - 1 sets - 10 reps - Seated Hip Abduction with Resistance  - 1 x daily - 7 x weekly - 1 sets - 20 reps - Seated Hamstring Curls with Resistance  - 1 x daily - 7 x weekly - 2 sets - 10 reps - Seated Long Arc Quad with Ankle Weight  - 1 x daily - 7 x weekly - 2 sets - 10 reps - Standing Hip Abduction with Counter Support  - 1 x daily - 7 x weekly - 2 sets - 10 reps - Supine Knee Extension Strengthening  - 1 x daily - 7 x weekly - 2 sets - 10 reps - Forward Step Down Touch with Toe  - 1 x daily - 7 x weekly - 1 sets - 10 reps - Seated Quad Set  - 1 x daily - 7 x weekly - 1 sets - 10 reps - 5 hold - Side Stepping with Resistance at Feet  - 1 x daily - 7 x weekly - 1 sets - 5 reps  ASSESSMENT:  CLINICAL IMPRESSION: Jamie Glover is progressing appropriately.  She continues to have some mild shearing pain at lateral right patella but is able to do increased resistance with increased quad arc.  She was able to return demonstration on proper hip alignment training.  She is well motivated and compliant.  She should continue to do well.  She would benefit from continued skilled PT for quad rehab and hip strengthening to reduce patellofemoral pain.    OBJECTIVE IMPAIRMENTS Abnormal gait, decreased activity tolerance, decreased balance, decreased endurance, decreased mobility, difficulty walking, decreased ROM, decreased strength,  hypomobility, increased muscle spasms, impaired flexibility, improper body mechanics, postural dysfunction, and pain.   ACTIVITY LIMITATIONS standing, stairs, and locomotion level  PARTICIPATION LIMITATIONS: community activity  PERSONAL FACTORS Age and Time since onset of injury/illness/exacerbation are also affecting patient's functional outcome.   REHAB POTENTIAL: Good  CLINICAL DECISION MAKING: Evolving/moderate complexity  EVALUATION COMPLEXITY: Moderate   GOALS: Goals reviewed with patient? Yes  SHORT TERM GOALS: Target date: 12/24/2021  Pt will be independent with her initial HEP to improve LE strength and flexibility.  Baseline: Goal status: met 8/4  LONG TERM GOALS: Target date: 01/21/2022   Pt will have improved Rt gastroc strength, evident by her ability to complete 15 single leg heel raises.  Baseline:  Goal status: INITIAL  2.  Pt will have improved hip strength to 4/5 MMT. Baseline:  Goal status: INITIAL  3.  Pt will have increase in ankle dorsiflexion to atleast 10 deg for improved foot clearance during ambulation.  Baseline:  Goal status: INITIAL  4.  Pt will be able to descend clinic steps with adequate eccentric control and without increase in foot/knee pain. Baseline:  Goal status: INITIAL  5.  Pt will report atleast 50% improvement in her Rt foot pain from the start of PT. Baseline:  Goal status: INITIAL    PLAN: PT FREQUENCY: 2x/week  PT DURATION: 6 weeks  PLANNED INTERVENTIONS: Therapeutic exercises, Therapeutic activity, Neuromuscular re-education, Balance training, Gait training, Patient/Family education, Joint mobilization, Stair training, Aquatic Therapy, Dry Needling, Cryotherapy, Taping, and Manual therapy  PLAN FOR NEXT SESSION:  Nu-step; leg press;  info on aquatic PT possibly 1x/week with land PT, progress glute, quad and peroneal strength,  ankle DF ROM, STM as needed peroneals/gastrocs if needed   Harneet Noblett B. Faelyn Sigler, PT 01/05/22  10:02 PM  [facility] [address], [place], [place] [address] Phone # [phone] Fax [phone]

## 2022-01-07 ENCOUNTER — Ambulatory Visit: Payer: PPO

## 2022-01-07 DIAGNOSIS — M25671 Stiffness of right ankle, not elsewhere classified: Secondary | ICD-10-CM

## 2022-01-07 DIAGNOSIS — R262 Difficulty in walking, not elsewhere classified: Secondary | ICD-10-CM

## 2022-01-07 DIAGNOSIS — M79605 Pain in left leg: Secondary | ICD-10-CM

## 2022-01-07 DIAGNOSIS — M6281 Muscle weakness (generalized): Secondary | ICD-10-CM

## 2022-01-07 DIAGNOSIS — M79604 Pain in right leg: Secondary | ICD-10-CM

## 2022-01-07 NOTE — Therapy (Signed)
OUTPATIENT PHYSICAL THERAPY LOWER EXTREMITY TREATMENT NOTE   Patient Name: Jamie Glover MRN: [id number] DOB:[birth year], 66 y.o., female Today's Date: 01/07/2022   PT End of Session - 01/07/22 1419     Visit Number 7    Date for PT Re-Evaluation 01/20/22    Authorization Type [employer]    Authorization Time Period 12/08/21 to 01/20/22    PT Start Time 1406    PT Stop Time 1445    PT Time Calculation (min) 39 min    Activity Tolerance Patient tolerated treatment well    Behavior During Therapy [facility] for tasks assessed/performed              Past Medical History:  Diagnosis Date   Arthritis    Hypertension    OSA (obstructive sleep apnea) 2013   Past Surgical History:  Procedure Laterality Date   BREAST BIOPSY     c-sections     CHOLECYSTECTOMY     CHOLECYSTECTOMY, LAPAROSCOPIC  1996   right knee surgery     Patient Active Problem List   Diagnosis Date Noted   Peroneal tendonitis, right 09/15/2021   Unilateral primary osteoarthritis, left knee 04/14/2020   Primary osteoarthritis of left knee 01/15/2019   Unilateral primary osteoarthritis, right knee 12/26/2017   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 ear 12/19/2017   Eustachian tube dysfunction, left 12/19/2017   Chronic serous otitis media of left ear 12/19/2017   Tinnitus 10/01/2011   Vitamin D deficiency 10/01/2011   GERD (gastroesophageal reflux disease) 10/01/2011   Neck pain 10/01/2011   HTN (hypertension) 10/01/2011   Sleep apnea 09/29/2011   BMI 33.0-33.9,adult 09/29/2011    PCP: Milagros Evener, MD  REFERRING PROVIDER: Faustino Congress, NP  REFERRING DIAG: Pain in Rt knee, Pain in Rt leg, Pain in Lt leg   THERAPY DIAG:  Pain in right leg  Pain in left leg  Stiffness of right ankle, not elsewhere classified  Muscle weakness (generalized)  Difficulty in walking, not elsewhere classified  Rationale for Evaluation and  Treatment Rehabilitation  ONSET DATE: 2 years ago  SUBJECTIVE:   SUBJECTIVE STATEMENT: Patient reports she did well with the tape but that it came off  soon after she got home that day.    PERTINENT HISTORY:  Arthritis Rt knee, not much improvement with PT  Pt states that about 2 years ago, she was walking and felt a pop in her Rt foot. Some time went by and her foot was feeling a little better. She went on a trip and did a lot of walking which seemed to cause a flare-up in her foot pain. In January, she saw a podiatrist and was put in a boot for 3 weeks and had no improvement. She was sent to PT but was 1 of 4 pts at a time and felt that after 8 weeks she was not seeing much improvement. While at PT, she also noticed her Lt knee/outer leg was bothering her during her sessions. She has been on Mobic the last week and feels that her pain is better than usual because of this.   PAIN:  Are you having pain? Yes: NPRS scale: 0-1/10 Pain location: Rt lateral foot 5th toe to heel  Pain description: sore Aggravating factors: walking, heel raises, standing on 1 leg Relieving factors: resting off her feet  PRECAUTIONS: None  WEIGHT BEARING RESTRICTIONS No  FALLS:  Has patient fallen in last 6 months? No  LIVING  ENVIRONMENT: Lives with: lives with their family Lives in: House/apartment Stairs: Yes: External: 5 steps;   Has following equipment at home: None  OCCUPATION: retired Marine scientist   PLOF: Independent  PATIENT GOALS be able to walk without increase in Rt and Lt LE pain    OBJECTIVE:   DIAGNOSTIC FINDINGS: MRI Rt foot  PATIENT SURVEYS:  FOTO 55  COGNITION:  Overall cognitive status: Within functional limits for tasks assessed     SENSATION: Denies numbness/tingling  EDEMA:  None noticed   POSTURE:  LE adduction, foot eversion  PALPATION: Tenderness over lateral aspect of the foot, at peroneal tendon insertion  LOWER EXTREMITY ROM:  Passive ROM Right eval  Left eval  Hip flexion    Hip extension    Hip abduction    Hip adduction    Hip internal rotation    Hip external rotation    Knee flexion    Knee extension    Ankle dorsiflexion 0 0  Ankle plantarflexion    Ankle inversion 25 (+pain)  30  Ankle eversion     (Blank rows = not tested)  LOWER EXTREMITY MMT:  MMT Right eval Left eval 8/4  Hip flexion     Hip extension 3/5 3/5 4 right/left  Hip abduction 3/5 3/5 4 right/left  Hip adduction     Hip internal rotation     Hip external rotation     Knee flexion 4/5 4/5 4+ right/left  Knee extension 5/5 5/5 4 right/ 5 left  Ankle dorsiflexion 5/5    Ankle plantarflexion Unable to complete full range heel raise X10 reps    Ankle inversion 5/5    Ankle eversion 5/5     (Blank rows = not tested)  LOWER EXTREMITY SPECIAL TESTS:    FUNCTIONAL TESTS:    GAIT: Distance walked:  Assistive device utilized:  Level of assistance:  Comments: Pt ambulates with decreased glute activation, (+) trendelenburg bilateral, hip adduction, foot eversion  Stairs: (+) trendelenburg, poor eccentric control bilaterally when descending steps    TODAY'S TREATMENT: 8/8: NuStep L5 x 5' PT present to discuss symptoms  Re taped for medial patellar glide using McConnell taping technique Sit to stand in front of mirror with heavy emphasis on avoiding knee valgus on right. Educated patient on using hip ER muscles to control this Leg press 50# both with vc's for correct alignment, single leg press 30# vc's for alignment x 20 each March x 20 with 5 lb both Seated hip ER with 5 lb 2 x 10 both Lateral band walks x 3 laps of 10 steps blue loop Mini lunge to BOSU x 10 on each LE Standing with slight knee flexion in front of mirror, blue loop hip ER x 20 Supine quad progression as follows: Quad sets x 20 both TKE with small green noodle x 20 both SAQ with white foam roller with 5lb ankle weight x 20 both SAQ with teal bolster x 20 with 5 lb  both 8/8: NuStep L5 x 5' PT present to discuss symptoms  Leg press 50# both with vc's for correct alignment, single leg press 30# vc's for alignment x 20 each Lateral band walks x 3 laps of 10 steps blue loop Seated right  LAQ 5lb x 20 both (no patellar tendon pain today) March x 20 with 5 lb both Standing with slight knee flexion in front of mirror, blue loop hip ER x 20 Sit to stand with blue loop for encouraging hip ER 2 x 10  Supine quad progression as follows: Quad sets x 20 both TKE with small green noodle x 20 both SAQ with white foam roller with 5lb ankle weight x 20 both SAQ with teal bolster x 20 with 5 lb both  8/4: NuStep L5 x 6' PT present to discuss symptoms  Review of HEP to answer questions Seated piriformis stretch seated Hip abduction with ball against wall 5x each way 2 inch step downs with right toe touch to floor 10x;  attempted left side but too painful so discontinued Seated right  LAQ 5lb 2x10 (some patellar tendon discomfort but mild) Seated quad isometrics 5 sec hold 10x At the railing light green loop on toes side stepping to activate peroneals Leg press seat 8 bil 60# 15x; single leg 30# 10x each side Neuromusc re-ed: glute medius, quads, peroneals Therapeutic activity: curbs, steps, standing, walking   8/2: NuStep L5 x 6' PT present to discuss symptoms and monitor Seated piriformis stretch (fig 4 and knee hug each) 20-30 sec hold right/left 2x Sit to stand with 10 lb kb 2 x 10, added ball b/w knees 2nd set Seated LAQ 4lb 2x10 bil Hip flexor stretch on 2nd step reach up and over 5x5" each side Seated Rt ankle inversion ball squeeze ball b/w feet and eversion ball press against wall 10x5" holds each Seated hamstring curl x 20 each LE with red tband Seated single clam x 20 with blue loop above knees Rt/Lt Lateral step up 4" Rt x 10 with bil UE support Standing hip abduction bil 2x10 with bil UE support Rt LE on 2" riser step down heel taps x 10 with  single UE support (very challenging and some Rt knee pain) SAQ over bolster with ball squeeze 4lb ankle weight x 10, focus on eccentric lowering   PATIENT EDUCATION:  Education details: eval findings/POC; HEP Person educated: Patient Education method: Explanation Education comprehension: returned demonstration   HOME EXERCISE PROGRAM: Access Code: [email or web address] URL: [email or web address] Date: 01/01/2022 Prepared by: Ruben Im  Exercises - Supine Bridge with Resistance Band  - 1 x daily - 7 x weekly - 2-3 sets - 10 reps - Sidelying Hip Abduction  - 1 x daily - 7 x weekly - 1-2 sets - 10 reps - Supine Piriformis Stretch with Foot on Ground  - 1 x daily - 7 x weekly - 2 sets - 30 sec hold - Standing Knee Flexion Stretch on Step  - 1 x daily - 7 x weekly - 1 sets - 10 reps - Standing Isometric Hip Abduction with Ball on Wall  - 1 x daily - 7 x weekly - 1 sets - 5 reps - 5 hold - Long Sitting Isometric Ankle Inversion in Dorsiflexion with Ball at [place]  - 1 x daily - 7 x weekly - 1 sets - 10 reps - 5 hold - Long Sitting Isometric Ankle Eversion in Dorsiflexion with Ball at [place]  - 1 x daily - 7 x weekly - 3 sets - 10 reps - 5 hold - Sit to Stand  - 1 x daily - 7 x weekly - 1 sets - 10 reps - Seated Hip Abduction with Resistance  - 1 x daily - 7 x weekly - 1 sets - 20 reps - Seated Hamstring Curls with Resistance  - 1 x daily - 7 x weekly - 2 sets - 10 reps - Seated Long Arc Quad with Ankle Weight  - 1 x daily - 7 x weekly - 2 sets -  10 reps - Standing Hip Abduction with Counter Support  - 1 x daily - 7 x weekly - 2 sets - 10 reps - Supine Knee Extension Strengthening  - 1 x daily - 7 x weekly - 2 sets - 10 reps - Forward Step Down Touch with Toe  - 1 x daily - 7 x weekly - 1 sets - 10 reps - Seated Quad Set  - 1 x daily - 7 x weekly - 1 sets - 10 reps - 5 hold - Side Stepping with Resistance at Feet  - 1 x daily - 7 x weekly - 1 sets - 5 reps  ASSESSMENT:  CLINICAL  IMPRESSION: Jamie Glover is progressing appropriately.  She continues to have the crepitus and tracking issues with pain on some angles of open chain extension.  The tape helps somewhat.  We added ball between knees for encouraging VMO.  She had slight improvement with this but crepitus was still palpable at lateral distal patella.  She would benefit from continued skilled PT for quad rehab and hip strengthening to reduce patellofemoral pain.    OBJECTIVE IMPAIRMENTS Abnormal gait, decreased activity tolerance, decreased balance, decreased endurance, decreased mobility, difficulty walking, decreased ROM, decreased strength, hypomobility, increased muscle spasms, impaired flexibility, improper body mechanics, postural dysfunction, and pain.   ACTIVITY LIMITATIONS standing, stairs, and locomotion level  PARTICIPATION LIMITATIONS: community activity  PERSONAL FACTORS Age and Time since onset of injury/illness/exacerbation are also affecting patient's functional outcome.   REHAB POTENTIAL: Good  CLINICAL DECISION MAKING: Evolving/moderate complexity  EVALUATION COMPLEXITY: Moderate   GOALS: Goals reviewed with patient? Yes  SHORT TERM GOALS: Target date: 12/24/2021  Pt will be independent with her initial HEP to improve LE strength and flexibility.  Baseline: Goal status: met 8/4  LONG TERM GOALS: Target date: 01/21/2022   Pt will have improved Rt gastroc strength, evident by her ability to complete 15 single leg heel raises.  Baseline:  Goal status: INITIAL  2.  Pt will have improved hip strength to 4/5 MMT. Baseline:  Goal status: INITIAL  3.  Pt will have increase in ankle dorsiflexion to atleast 10 deg for improved foot clearance during ambulation.  Baseline:  Goal status: INITIAL  4.  Pt will be able to descend clinic steps with adequate eccentric control and without increase in foot/knee pain. Baseline:  Goal status: INITIAL  5.  Pt will report atleast 50% improvement in her Rt  foot pain from the start of PT. Baseline:  Goal status: INITIAL    PLAN: PT FREQUENCY: 2x/week  PT DURATION: 6 weeks  PLANNED INTERVENTIONS: Therapeutic exercises, Therapeutic activity, Neuromuscular re-education, Balance training, Gait training, Patient/Family education, Joint mobilization, Stair training, Aquatic Therapy, Dry Needling, Cryotherapy, Taping, and Manual therapy  PLAN FOR NEXT SESSION:  Nu-step; leg press;  info on aquatic PT possibly 1x/week with land PT, progress glute, quad and peroneal strength,  ankle DF ROM, STM as needed peroneals/gastrocs if needed   Aizen Duval B. Brezlyn Manrique, PT 01/07/22 2:46 PM  [facility] [address], [place], [place] [address] Phone # [phone] Fax [phone]

## 2022-01-12 ENCOUNTER — Ambulatory Visit: Payer: PPO | Admitting: Physical Therapy

## 2022-01-12 DIAGNOSIS — M25671 Stiffness of right ankle, not elsewhere classified: Secondary | ICD-10-CM

## 2022-01-12 DIAGNOSIS — M6281 Muscle weakness (generalized): Secondary | ICD-10-CM

## 2022-01-12 DIAGNOSIS — M79604 Pain in right leg: Secondary | ICD-10-CM | POA: Diagnosis not present

## 2022-01-12 DIAGNOSIS — M79605 Pain in left leg: Secondary | ICD-10-CM

## 2022-01-12 NOTE — Therapy (Signed)
OUTPATIENT PHYSICAL THERAPY LOWER EXTREMITY TREATMENT NOTE   Patient Name: Jamie Glover MRN: [id number] DOB:[birth year], 66 y.o., female Today's Date: 01/12/2022   PT End of Session - 01/12/22 1016     Visit Number 8    Date for PT Re-Evaluation 01/20/22    Authorization Type [employer]    Authorization Time Period 12/08/21 to 01/20/22    PT Start Time 1017    PT Stop Time 1100    PT Time Calculation (min) 43 min    Activity Tolerance Patient tolerated treatment well              Past Medical History:  Diagnosis Date   Arthritis    Hypertension    OSA (obstructive sleep apnea) 2013   Past Surgical History:  Procedure Laterality Date   BREAST BIOPSY     c-sections     CHOLECYSTECTOMY     CHOLECYSTECTOMY, LAPAROSCOPIC  1996   right knee surgery     Patient Active Problem List   Diagnosis Date Noted   Peroneal tendonitis, right 09/15/2021   Unilateral primary osteoarthritis, left knee 04/14/2020   Primary osteoarthritis of left knee 01/15/2019   Unilateral primary osteoarthritis, right knee 12/26/2017   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 ear 12/19/2017   Eustachian tube dysfunction, left 12/19/2017   Chronic serous otitis media of left ear 12/19/2017   Tinnitus 10/01/2011   Vitamin D deficiency 10/01/2011   GERD (gastroesophageal reflux disease) 10/01/2011   Neck pain 10/01/2011   HTN (hypertension) 10/01/2011   Sleep apnea 09/29/2011   BMI 33.0-33.9,adult 09/29/2011    PCP: Milagros Evener, MD  REFERRING PROVIDER: Faustino Congress, NP  REFERRING DIAG: Pain in Rt knee, Pain in Rt leg, Pain in Lt leg   THERAPY DIAG:  Pain in right leg  Pain in left leg  Stiffness of right ankle, not elsewhere classified  Muscle weakness (generalized)  Rationale for Evaluation and Treatment Rehabilitation  ONSET DATE: 2 years ago  SUBJECTIVE:   SUBJECTIVE STATEMENT: Pain in foot with coming  down 5 steps.  It varies.  The tape didn't stay on.  It was off by bedtime.  The ex's are getting easier and I can tell my ADLS are getting easier.  That's motivation to keep doing them.    PERTINENT HISTORY:  Arthritis Rt knee, not much improvement with PT  Pt states that about 2 years ago, she was walking and felt a pop in her Rt foot. Some time went by and her foot was feeling a little better. She went on a trip and did a lot of walking which seemed to cause a flare-up in her foot pain. In January, she saw a podiatrist and was put in a boot for 3 weeks and had no improvement. She was sent to PT but was 1 of 4 pts at a time and felt that after 8 weeks she was not seeing much improvement. While at PT, she also noticed her Lt knee/outer leg was bothering her during her sessions. She has been on Mobic the last week and feels that her pain is better than usual because of this.   PAIN:  Are you having pain? Yes: NPRS scale: 0-1/10 Pain location: right knee and Rt lateral foot 5th toe to heel  Pain description: sore Aggravating factors: walking, heel raises, standing on 1 leg Relieving factors: resting off her feet  PRECAUTIONS: None  WEIGHT BEARING RESTRICTIONS No  FALLS:  Has patient fallen in last 6 months? No  LIVING ENVIRONMENT: Lives with: lives with their family Lives in: House/apartment Stairs: Yes: External: 5 steps;   Has following equipment at home: None  OCCUPATION: retired Marine scientist   PLOF: Independent  PATIENT GOALS be able to walk without increase in Rt and Lt LE pain    OBJECTIVE:   DIAGNOSTIC FINDINGS: MRI Rt foot  PATIENT SURVEYS:  FOTO 55  COGNITION:  Overall cognitive status: Within functional limits for tasks assessed     SENSATION: Denies numbness/tingling  EDEMA:  None noticed   POSTURE:  LE adduction, foot eversion  Right knee valgus PALPATION: Tenderness over lateral aspect of the foot, at peroneal tendon insertion  LOWER EXTREMITY  ROM:  Passive ROM Right eval Left eval  Hip flexion    Hip extension    Hip abduction    Hip adduction    Hip internal rotation    Hip external rotation    Knee flexion    Knee extension    Ankle dorsiflexion 0 0  Ankle plantarflexion    Ankle inversion 25 (+pain)  30  Ankle eversion     (Blank rows = not tested)  LOWER EXTREMITY MMT:  MMT Right eval Left eval 8/4  Hip flexion     Hip extension 3/5 3/5 4 right/left  Hip abduction 3/5 3/5 4 right/left  Hip adduction     Hip internal rotation     Hip external rotation     Knee flexion 4/5 4/5 4+ right/left  Knee extension 5/5 5/5 4 right/ 5 left  Ankle dorsiflexion 5/5    Ankle plantarflexion Unable to complete full range heel raise X10 reps    Ankle inversion 5/5    Ankle eversion 5/5     (Blank rows = not tested)  LOWER EXTREMITY SPECIAL TESTS:    FUNCTIONAL TESTS:    GAIT: Distance walked:  Assistive device utilized:  Level of assistance:  Comments: Pt ambulates with decreased glute activation, (+) trendelenburg bilateral, hip adduction, foot eversion  Stairs: (+) trendelenburg, poor eccentric control bilaterally when descending steps    TODAY'S TREATMENT:  8/15: NuStep L3 (old model) x 6' PT present to discuss symptoms  Dynamic warm up: side step, high step, backwards, hip rotations 2 laps each at the railing Side lunge 5x right/left  2 inch step downs with right heel  touch to floor 10x;  able to do 10x on left with flat foot Blue loop above knees 3 way hip 10x each side LAQ 5#  15x right/left  SAQ 5# 15x Right/left 10# single dead lifts in kickstand position to touch tall cone 8x right/left  Leg press 60# both with vc's for correct alignment 15x, single leg press 30# vc's for alignment x 15 each Resisted walk backwards 15# 10x (inc weight next time) Neuromusc re-ed: glute medius, quads Therapeutic activity: curbs, steps, standing, walking      8/8: NuStep L5 x 5' PT present to discuss  symptoms  Re taped for medial patellar glide using McConnell taping technique Sit to stand in front of mirror with heavy emphasis on avoiding knee valgus on right. Educated patient on using hip ER muscles to control this Leg press 50# both with vc's for correct alignment, single leg press 30# vc's for alignment x 20 each March x 20 with 5 lb both Seated hip ER with 5 lb 2 x 10 both Lateral band walks x 3 laps of 10 steps blue loop Mini lunge to BOSU  x 10 on each LE Standing with slight knee flexion in front of mirror, blue loop hip ER x 20 Supine quad progression as follows: Quad sets x 20 both TKE with small green noodle x 20 both SAQ with white foam roller with 5lb ankle weight x 20 both SAQ with teal bolster x 20 with 5 lb both    PATIENT EDUCATION:  Education details: eval findings/POC; HEP Person educated: Patient Education method: Explanation Education comprehension: returned demonstration   HOME EXERCISE PROGRAM: Access Code: [email or web address] URL: [email or web address] Date: 01/01/2022 Prepared by: Ruben Im  Exercises - Supine Bridge with Resistance Band  - 1 x daily - 7 x weekly - 2-3 sets - 10 reps - Sidelying Hip Abduction  - 1 x daily - 7 x weekly - 1-2 sets - 10 reps - Supine Piriformis Stretch with Foot on Ground  - 1 x daily - 7 x weekly - 2 sets - 30 sec hold - Standing Knee Flexion Stretch on Step  - 1 x daily - 7 x weekly - 1 sets - 10 reps - Standing Isometric Hip Abduction with Ball on Wall  - 1 x daily - 7 x weekly - 1 sets - 5 reps - 5 hold - Long Sitting Isometric Ankle Inversion in Dorsiflexion with Ball at [place]  - 1 x daily - 7 x weekly - 1 sets - 10 reps - 5 hold - Long Sitting Isometric Ankle Eversion in Dorsiflexion with Ball at [place]  - 1 x daily - 7 x weekly - 3 sets - 10 reps - 5 hold - Sit to Stand  - 1 x daily - 7 x weekly - 1 sets - 10 reps - Seated Hip Abduction with Resistance  - 1 x daily - 7 x weekly - 1 sets - 20 reps - Seated  Hamstring Curls with Resistance  - 1 x daily - 7 x weekly - 2 sets - 10 reps - Seated Long Arc Quad with Ankle Weight  - 1 x daily - 7 x weekly - 2 sets - 10 reps - Standing Hip Abduction with Counter Support  - 1 x daily - 7 x weekly - 2 sets - 10 reps - Supine Knee Extension Strengthening  - 1 x daily - 7 x weekly - 2 sets - 10 reps - Forward Step Down Touch with Toe  - 1 x daily - 7 x weekly - 1 sets - 10 reps - Seated Quad Set  - 1 x daily - 7 x weekly - 1 sets - 10 reps - 5 hold - Side Stepping with Resistance at Feet  - 1 x daily - 7 x weekly - 1 sets - 5 reps  ASSESSMENT:  CLINICAL IMPRESSION: The patient is able to continue with a steady progression of exercise intensity and challenge.  Previously she was unable to do step downs (weight bearing on right) but today she was able to do 10 repetitions (small step).  Good awareness of using muscular control to avoid knee hyperextension compensatory strategy.  Therapist monitoring response throughout session.     OBJECTIVE IMPAIRMENTS Abnormal gait, decreased activity tolerance, decreased balance, decreased endurance, decreased mobility, difficulty walking, decreased ROM, decreased strength, hypomobility, increased muscle spasms, impaired flexibility, improper body mechanics, postural dysfunction, and pain.   ACTIVITY LIMITATIONS standing, stairs, and locomotion level  PARTICIPATION LIMITATIONS: community activity  PERSONAL FACTORS Age and Time since onset of injury/illness/exacerbation are also affecting patient's functional outcome.   REHAB POTENTIAL: Good  CLINICAL DECISION MAKING: Evolving/moderate complexity  EVALUATION COMPLEXITY: Moderate   GOALS: Goals reviewed with patient? Yes  SHORT TERM GOALS: Target date: 12/24/2021  Pt will be independent with her initial HEP to improve LE strength and flexibility.  Baseline: Goal status: met 8/4  LONG TERM GOALS: Target date: 01/21/2022   Pt will have improved Rt gastroc  strength, evident by her ability to complete 15 single leg heel raises.  Baseline:  Goal status: INITIAL  2.  Pt will have improved hip strength to 4/5 MMT. Baseline:  Goal status: INITIAL  3.  Pt will have increase in ankle dorsiflexion to atleast 10 deg for improved foot clearance during ambulation.  Baseline:  Goal status: INITIAL  4.  Pt will be able to descend clinic steps with adequate eccentric control and without increase in foot/knee pain. Baseline:  Goal status: INITIAL  5.  Pt will report atleast 50% improvement in her Rt foot pain from the start of PT. Baseline:  Goal status: INITIAL    PLAN: PT FREQUENCY: 2x/week  PT DURATION: 6 weeks  PLANNED INTERVENTIONS: Therapeutic exercises, Therapeutic activity, Neuromuscular re-education, Balance training, Gait training, Patient/Family education, Joint mobilization, Stair training, Aquatic Therapy, Dry Needling, Cryotherapy, Taping, and Manual therapy  PLAN FOR NEXT SESSION:  Nu-step; leg press;  info on aquatic PT possibly 1x/week with land PT, progress glute, quad and peroneal strength,  ankle DF ROM, STM as needed peroneals/gastrocs if needed   Ruben Im, PT 01/12/22 6:52 PM Phone: [phone] Fax: [place] [address] [place], [place] [address] Phone # [phone] Fax [phone]

## 2022-01-14 ENCOUNTER — Ambulatory Visit: Payer: PPO

## 2022-01-14 DIAGNOSIS — R262 Difficulty in walking, not elsewhere classified: Secondary | ICD-10-CM

## 2022-01-14 DIAGNOSIS — M6281 Muscle weakness (generalized): Secondary | ICD-10-CM

## 2022-01-14 DIAGNOSIS — M79605 Pain in left leg: Secondary | ICD-10-CM

## 2022-01-14 DIAGNOSIS — M25671 Stiffness of right ankle, not elsewhere classified: Secondary | ICD-10-CM

## 2022-01-14 DIAGNOSIS — M79604 Pain in right leg: Secondary | ICD-10-CM | POA: Diagnosis not present

## 2022-01-14 NOTE — Therapy (Signed)
OUTPATIENT PHYSICAL THERAPY LOWER EXTREMITY TREATMENT NOTE   Patient Name: Jamie Glover MRN: [id number] DOB:[birth year], 66 y.o., female Today's Date: 01/14/2022   PT End of Session - 01/14/22 1019     Visit Number 9    Date for PT Re-Evaluation 01/20/22    Authorization Type [employer]    Authorization Time Period 12/08/21 to 01/20/22    PT Start Time 1017    PT Stop Time 1100    PT Time Calculation (min) 43 min    Activity Tolerance Patient tolerated treatment well    Behavior During Therapy [facility] for tasks assessed/performed              Past Medical History:  Diagnosis Date   Arthritis    Hypertension    OSA (obstructive sleep apnea) 2013   Past Surgical History:  Procedure Laterality Date   BREAST BIOPSY     c-sections     CHOLECYSTECTOMY     CHOLECYSTECTOMY, LAPAROSCOPIC  1996   right knee surgery     Patient Active Problem List   Diagnosis Date Noted   Peroneal tendonitis, right 09/15/2021   Unilateral primary osteoarthritis, left knee 04/14/2020   Primary osteoarthritis of left knee 01/15/2019   Unilateral primary osteoarthritis, right knee 12/26/2017   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 ear 12/19/2017   Eustachian tube dysfunction, left 12/19/2017   Chronic serous otitis media of left ear 12/19/2017   Tinnitus 10/01/2011   Vitamin D deficiency 10/01/2011   GERD (gastroesophageal reflux disease) 10/01/2011   Neck pain 10/01/2011   HTN (hypertension) 10/01/2011   Sleep apnea 09/29/2011   BMI 33.0-33.9,adult 09/29/2011    PCP: Milagros Evener, MD  REFERRING PROVIDER: Faustino Congress, NP  REFERRING DIAG: Pain in Rt knee, Pain in Rt leg, Pain in Lt leg   THERAPY DIAG:  Pain in right leg  Pain in left leg  Stiffness of right ankle, not elsewhere classified  Muscle weakness (generalized)  Difficulty in walking, not elsewhere classified  Rationale for Evaluation and  Treatment Rehabilitation  ONSET DATE: 2 years ago  SUBJECTIVE:   SUBJECTIVE STATEMENT: Patient states she is doing well.  Minimal pain.  Everything is getting easier.      PERTINENT HISTORY:  Arthritis Rt knee, not much improvement with PT  Pt states that about 2 years ago, she was walking and felt a pop in her Rt foot. Some time went by and her foot was feeling a little better. She went on a trip and did a lot of walking which seemed to cause a flare-up in her foot pain. In January, she saw a podiatrist and was put in a boot for 3 weeks and had no improvement. She was sent to PT but was 1 of 4 pts at a time and felt that after 8 weeks she was not seeing much improvement. While at PT, she also noticed her Lt knee/outer leg was bothering her during her sessions. She has been on Mobic the last week and feels that her pain is better than usual because of this.   PAIN:  Are you having pain? Yes: NPRS scale: 0-1/10 Pain location: right knee and Rt lateral foot 5th toe to heel  Pain description: sore Aggravating factors: walking, heel raises, standing on 1 leg Relieving factors: resting off her feet  PRECAUTIONS: None  WEIGHT BEARING RESTRICTIONS No  FALLS:  Has patient fallen in last 6 months? No  LIVING ENVIRONMENT: Lives  with: lives with their family Lives in: House/apartment Stairs: Yes: External: 5 steps;   Has following equipment at home: None  OCCUPATION: retired Marine scientist   PLOF: Independent  PATIENT GOALS be able to walk without increase in Rt and Lt LE pain    OBJECTIVE:   DIAGNOSTIC FINDINGS: MRI Rt foot  PATIENT SURVEYS:  FOTO 55  COGNITION:  Overall cognitive status: Within functional limits for tasks assessed     SENSATION: Denies numbness/tingling  EDEMA:  None noticed   POSTURE:  LE adduction, foot eversion  Right knee valgus PALPATION: Tenderness over lateral aspect of the foot, at peroneal tendon insertion  LOWER EXTREMITY ROM:  Passive ROM  Right eval Left eval  Hip flexion    Hip extension    Hip abduction    Hip adduction    Hip internal rotation    Hip external rotation    Knee flexion    Knee extension    Ankle dorsiflexion 0 0  Ankle plantarflexion    Ankle inversion 25 (+pain)  30  Ankle eversion     (Blank rows = not tested)  LOWER EXTREMITY MMT:  MMT Right eval Left eval 8/4  Hip flexion     Hip extension 3/5 3/5 4 right/left  Hip abduction 3/5 3/5 4 right/left  Hip adduction     Hip internal rotation     Hip external rotation     Knee flexion 4/5 4/5 4+ right/left  Knee extension 5/5 5/5 4 right/ 5 left  Ankle dorsiflexion 5/5    Ankle plantarflexion Unable to complete full range heel raise X10 reps    Ankle inversion 5/5    Ankle eversion 5/5     (Blank rows = not tested)  LOWER EXTREMITY SPECIAL TESTS:    FUNCTIONAL TESTS:    GAIT: Distance walked:  Assistive device utilized:  Level of assistance:  Comments: Pt ambulates with decreased glute activation, (+) trendelenburg bilateral, hip adduction, foot eversion  Stairs: (+) trendelenburg, poor eccentric control bilaterally when descending steps    TODAY'S TREATMENT:  8/17: NuStep L3 (old model) x 5' PT present to discuss symptoms  Lateral band walks 3 laps with blue loop 10 steps each Sit to stand with 10 lb kb x 10 Squat to chair with x 10 KB swings with 10 lb kb 2 x 10 Leg press 65# both with vc's for correct alignment 20x, single leg press 30# vc's for alignment x 20 each LAQ 5#  20x right/left Step up on 8" 2 x 10 each LE (added blue loop to above knees and encouraged patient to keep space between thighs) Step down 2" 2 x 10 each LE (added blue loop to above knees and encouraged patient to keep space between thighs) Education on concentric and eccentric muscle activity and why descending steps is more painful to a valgus knee  8/15: NuStep L3 (old model) x 6' PT present to discuss symptoms  Dynamic warm up: side step, high  step, backwards, hip rotations 2 laps each at the railing Side lunge 5x right/left  2 inch step downs with right heel  touch to floor 10x;  able to do 10x on left with flat foot Blue loop above knees 3 way hip 10x each side LAQ 5#  15x right/left  SAQ 5# 15x Right/left 10# single dead lifts in kickstand position to touch tall cone 8x right/left  Leg press 60# both with vc's for correct alignment 15x, single leg press 30# vc's for alignment x  15 each Resisted walk backwards 15# 10x (inc weight next time) Neuromusc re-ed: glute medius, quads Therapeutic activity: curbs, steps, standing, walking  8/8: NuStep L5 x 5' PT present to discuss symptoms  Re taped for medial patellar glide using McConnell taping technique Sit to stand in front of mirror with heavy emphasis on avoiding knee valgus on right. Educated patient on using hip ER muscles to control this Leg press 50# both with vc's for correct alignment, single leg press 30# vc's for alignment x 20 each March x 20 with 5 lb both Seated hip ER with 5 lb 2 x 10 both Lateral band walks x 3 laps of 10 steps blue loop Mini lunge to BOSU x 10 on each LE Standing with slight knee flexion in front of mirror, blue loop hip ER x 20 Supine quad progression as follows: Quad sets x 20 both TKE with small green noodle x 20 both SAQ with white foam roller with 5lb ankle weight x 20 both SAQ with teal bolster x 20 with 5 lb both    PATIENT EDUCATION:  Education details: eval findings/POC; HEP Person educated: Patient Education method: Explanation Education comprehension: returned demonstration   HOME EXERCISE PROGRAM: Access Code: [email or web address] URL: [email or web address] Date: 01/01/2022 Prepared by: Ruben Im  Exercises - Supine Bridge with Resistance Band  - 1 x daily - 7 x weekly - 2-3 sets - 10 reps - Sidelying Hip Abduction  - 1 x daily - 7 x weekly - 1-2 sets - 10 reps - Supine Piriformis Stretch with Foot on Ground  -  1 x daily - 7 x weekly - 2 sets - 30 sec hold - Standing Knee Flexion Stretch on Step  - 1 x daily - 7 x weekly - 1 sets - 10 reps - Standing Isometric Hip Abduction with Ball on Wall  - 1 x daily - 7 x weekly - 1 sets - 5 reps - 5 hold - Long Sitting Isometric Ankle Inversion in Dorsiflexion with Ball at [place]  - 1 x daily - 7 x weekly - 1 sets - 10 reps - 5 hold - Long Sitting Isometric Ankle Eversion in Dorsiflexion with Ball at [place]  - 1 x daily - 7 x weekly - 3 sets - 10 reps - 5 hold - Sit to Stand  - 1 x daily - 7 x weekly - 1 sets - 10 reps - Seated Hip Abduction with Resistance  - 1 x daily - 7 x weekly - 1 sets - 20 reps - Seated Hamstring Curls with Resistance  - 1 x daily - 7 x weekly - 2 sets - 10 reps - Seated Long Arc Quad with Ankle Weight  - 1 x daily - 7 x weekly - 2 sets - 10 reps - Standing Hip Abduction with Counter Support  - 1 x daily - 7 x weekly - 2 sets - 10 reps - Supine Knee Extension Strengthening  - 1 x daily - 7 x weekly - 2 sets - 10 reps - Forward Step Down Touch with Toe  - 1 x daily - 7 x weekly - 1 sets - 10 reps - Seated Quad Set  - 1 x daily - 7 x weekly - 1 sets - 10 reps - 5 hold - Side Stepping with Resistance at Feet  - 1 x daily - 7 x weekly - 1 sets - 5 reps  ASSESSMENT:  CLINICAL IMPRESSION: Patient is responding well to quad rehab and hip  strengthening to improve alignment.  She continues to have significant valgus posture but is gaining strength in hips to control this during activity such and sit to stand and stair climbing.  She has deficits with eccentric control as expected and we will continue to progress with these exercises.     OBJECTIVE IMPAIRMENTS Abnormal gait, decreased activity tolerance, decreased balance, decreased endurance, decreased mobility, difficulty walking, decreased ROM, decreased strength, hypomobility, increased muscle spasms, impaired flexibility, improper body mechanics, postural dysfunction, and pain.   ACTIVITY  LIMITATIONS standing, stairs, and locomotion level  PARTICIPATION LIMITATIONS: community activity  PERSONAL FACTORS Age and Time since onset of injury/illness/exacerbation are also affecting patient's functional outcome.   REHAB POTENTIAL: Good  CLINICAL DECISION MAKING: Evolving/moderate complexity  EVALUATION COMPLEXITY: Moderate   GOALS: Goals reviewed with patient? Yes  SHORT TERM GOALS: Target date: 12/24/2021  Pt will be independent with her initial HEP to improve LE strength and flexibility.  Baseline: Goal status: met 8/4  LONG TERM GOALS: Target date: 01/21/2022   Pt will have improved Rt gastroc strength, evident by her ability to complete 15 single leg heel raises.  Baseline:  Goal status: INITIAL  2.  Pt will have improved hip strength to 4/5 MMT. Baseline:  Goal status: INITIAL  3.  Pt will have increase in ankle dorsiflexion to atleast 10 deg for improved foot clearance during ambulation.  Baseline:  Goal status: INITIAL  4.  Pt will be able to descend clinic steps with adequate eccentric control and without increase in foot/knee pain. Baseline:  Goal status: INITIAL  5.  Pt will report atleast 50% improvement in her Rt foot pain from the start of PT. Baseline:  Goal status: INITIAL    PLAN: PT FREQUENCY: 2x/week  PT DURATION: 6 weeks  PLANNED INTERVENTIONS: Therapeutic exercises, Therapeutic activity, Neuromuscular re-education, Balance training, Gait training, Patient/Family education, Joint mobilization, Stair training, Aquatic Therapy, Dry Needling, Cryotherapy, Taping, and Manual therapy  PLAN FOR NEXT SESSION:  Nu-step; leg press;  info on aquatic PT possibly 1x/week with land PT, progress glute, quad and peroneal strength,  ankle DF ROM, STM as needed peroneals/gastrocs if needed   Nayib Remer B. Jori Frerichs, PT 01/14/22 12:32 PM  [place] [address], [place], [place] [address] Phone # [phone] Fax  [phone]

## 2022-01-19 ENCOUNTER — Ambulatory Visit: Payer: PPO | Admitting: Physical Therapy

## 2022-01-19 DIAGNOSIS — M79604 Pain in right leg: Secondary | ICD-10-CM | POA: Diagnosis not present

## 2022-01-19 DIAGNOSIS — M25671 Stiffness of right ankle, not elsewhere classified: Secondary | ICD-10-CM

## 2022-01-19 DIAGNOSIS — M79605 Pain in left leg: Secondary | ICD-10-CM

## 2022-01-19 DIAGNOSIS — M6281 Muscle weakness (generalized): Secondary | ICD-10-CM

## 2022-01-19 NOTE — Therapy (Signed)
OUTPATIENT PHYSICAL THERAPY LOWER EXTREMITY TREATMENT NOTE/RECERTIFICATION  .Progress Note Reporting Period 7/11 to 8/22  See note below for Objective Data and Assessment of Progress/Goals.     Patient Name: Jamie Glover MRN: [id number] DOB:1955/06/24, 66 y.o., female Today's Date: 01/19/2022   PT End of Session - 01/19/22 0845     Visit Number 10    Date for PT Re-Evaluation 03/16/22    Authorization Type [employer]    PT Start Time 0845    PT Stop Time 0925    PT Time Calculation (min) 40 min    Activity Tolerance Patient tolerated treatment well              Past Medical History:  Diagnosis Date   Arthritis    Hypertension    OSA (obstructive sleep apnea) 2013   Past Surgical History:  Procedure Laterality Date   BREAST BIOPSY     c-sections     CHOLECYSTECTOMY     CHOLECYSTECTOMY, LAPAROSCOPIC  1996   right knee surgery     Patient Active Problem List   Diagnosis Date Noted   Peroneal tendonitis, right 09/15/2021   Unilateral primary osteoarthritis, left knee 04/14/2020   Primary osteoarthritis of left knee 01/15/2019   Unilateral primary osteoarthritis, right knee 12/26/2017   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 ear 12/19/2017   Eustachian tube dysfunction, left 12/19/2017   Chronic serous otitis media of left ear 12/19/2017   Tinnitus 10/01/2011   Vitamin D deficiency 10/01/2011   GERD (gastroesophageal reflux disease) 10/01/2011   Neck pain 10/01/2011   HTN (hypertension) 10/01/2011   Sleep apnea 09/29/2011   BMI 33.0-33.9,adult 09/29/2011    PCP: Milagros Evener, MD  REFERRING PROVIDER: Faustino Congress, NP  REFERRING DIAG: Pain in Rt knee, Pain in Rt leg, Pain in Lt leg   THERAPY DIAG:  Pain in right leg  Pain in left leg  Stiffness of right ankle, not elsewhere classified  Muscle weakness (generalized)  Rationale for Evaluation and Treatment  Rehabilitation  ONSET DATE: 2 years ago  SUBJECTIVE:   SUBJECTIVE STATEMENT: Right lateral foot pain 1/10 today.  Overall progress 50% since start of care.    PERTINENT HISTORY:  Arthritis Rt knee, not much improvement with PT  Pt states that about 2 years ago, she was walking and felt a pop in her Rt foot. Some time went by and her foot was feeling a little better. She went on a trip and did a lot of walking which seemed to cause a flare-up in her foot pain. In January, she saw a podiatrist and was put in a boot for 3 weeks and had no improvement. She was sent to PT but was 1 of 4 pts at a time and felt that after 8 weeks she was not seeing much improvement. While at PT, she also noticed her Lt knee/outer leg was bothering her during her sessions. She has been on Mobic the last week and feels that her pain is better than usual because of this.   PAIN:  Are you having pain? Yes: NPRS scale: 0-1/10 Pain location: right knee and Rt lateral foot 5th toe to heel  Pain description: sore Aggravating factors: walking, heel raises, standing on 1 leg Relieving factors: resting off her feet  PRECAUTIONS: None  WEIGHT BEARING RESTRICTIONS No  FALLS:  Has patient fallen in last 6 months? No  LIVING ENVIRONMENT: Lives with: lives with their family Lives in:  House/apartment Stairs: Yes: External: 5 steps;   Has following equipment at home: None  OCCUPATION: retired Marine scientist   PLOF: Independent  PATIENT GOALS be able to walk without increase in Rt and Lt LE pain    OBJECTIVE:   DIAGNOSTIC FINDINGS: MRI Rt foot  PATIENT SURVEYS:  FOTO 55    8/22:  70% FOTO  COGNITION:  Overall cognitive status: Within functional limits for tasks assessed     SENSATION: Denies numbness/tingling  EDEMA:  None noticed   POSTURE:  LE adduction, foot eversion  Right knee valgus  PALPATION: Tenderness over lateral aspect of the foot, at peroneal tendon insertion  LOWER EXTREMITY  ROM:  Passive ROM Right eval Left eval 8/22  Hip flexion     Hip extension     Hip abduction     Hip adduction     Hip internal rotation     Hip external rotation     Knee flexion   125 bil   Knee extension     Ankle dorsiflexion 0 0 12 right  Ankle plantarflexion     Ankle inversion 25 (+pain)  30 28 right  Ankle eversion   38 right    (Blank rows = not tested)  LOWER EXTREMITY MMT:  MMT Right eval Left eval 8/4 8/22  Hip flexion      Hip extension 3/5 3/5 4 right/left 4+  Hip abduction 3/5 3/5 4 right/left 4+  Hip adduction      Hip internal rotation      Hip external rotation      Knee flexion 4/5 4/5 4+ right/left 4+  Knee extension 5/5 5/5 4 right/ 5 left 4 right   Ankle dorsiflexion 5/5     Ankle plantarflexion Unable to complete full range heel raise X10 reps     Ankle inversion 5/5     Ankle eversion 5/5      (Blank rows = not tested)  LOWER EXTREMITY SPECIAL TESTS:    FUNCTIONAL TESTS:  8/22 Single leg heel raises 10x proficient    GAIT: Distance walked:  Assistive device utilized:  Level of assistance:  Comments: Pt ambulates with decreased glute activation, (+) trendelenburg bilateral, hip adduction, foot eversion  Stairs: (+) trendelenburg, poor eccentric control bilaterally when descending steps    TODAY'S TREATMENT: 8/22:  FOTO, MMT; ROM; functional checks Discussion of progress toward goals and new goals set Single leg heel raises 10x proficient Runner's stretch on wall 2x 30 sec (added to HEP) Calf stretch on slant board 2x 30 sec Leg press seat 7 65# both with vc's for correct alignment 20x, single leg press 30# vc's for alignment x 20 each Therapeutic activity: curbs, steps, standing, walking   8/17: NuStep L3 (old model) x 5' PT present to discuss symptoms  Lateral band walks 3 laps with blue loop 10 steps each Sit to stand with 10 lb kb x 10 Squat to chair with x 10 KB swings with 10 lb kb 2 x 10 Leg press 65# both with vc's  for correct alignment 20x, single leg press 30# vc's for alignment x 20 each LAQ 5#  20x right/left Step up on 8" 2 x 10 each LE (added blue loop to above knees and encouraged patient to keep space between thighs) Step down 2" 2 x 10 each LE (added blue loop to above knees and encouraged patient to keep space between thighs) Education on concentric and eccentric muscle activity and why descending steps is more painful to  a valgus knee  8/15: NuStep L3 (old model) x 6' PT present to discuss symptoms  Dynamic warm up: side step, high step, backwards, hip rotations 2 laps each at the railing Side lunge 5x right/left  2 inch step downs with right heel  touch to floor 10x;  able to do 10x on left with flat foot Blue loop above knees 3 way hip 10x each side LAQ 5#  15x right/left  SAQ 5# 15x Right/left 10# single dead lifts in kickstand position to touch tall cone 8x right/left  Leg press 60# both with vc's for correct alignment 15x, single leg press 30# vc's for alignment x 15 each Resisted walk backwards 15# 10x (inc weight next time) Neuromusc re-ed: glute medius, quads Therapeutic activity: curbs, steps, standing, walking     PATIENT EDUCATION:  Education details: eval findings/POC; HEP Person educated: Patient Education method: Explanation Education comprehension: returned demonstration   HOME EXERCISE PROGRAM: Access Code: [email or web address] URL: [email or web address] Date: 01/19/2022 Prepared by: Ruben Im  Exercises - Supine Bridge with Resistance Band  - 1 x daily - 7 x weekly - 2-3 sets - 10 reps - Sidelying Hip Abduction  - 1 x daily - 7 x weekly - 1-2 sets - 10 reps - Supine Piriformis Stretch with Foot on Ground  - 1 x daily - 7 x weekly - 2 sets - 30 sec hold - Standing Knee Flexion Stretch on Step  - 1 x daily - 7 x weekly - 1 sets - 10 reps - Standing Isometric Hip Abduction with Ball on Wall  - 1 x daily - 7 x weekly - 1 sets - 5 reps - 5 hold - Long  Sitting Isometric Ankle Inversion in Dorsiflexion with Ball at [place]  - 1 x daily - 7 x weekly - 1 sets - 10 reps - 5 hold - Long Sitting Isometric Ankle Eversion in Dorsiflexion with Ball at [place]  - 1 x daily - 7 x weekly - 3 sets - 10 reps - 5 hold - Sit to Stand  - 1 x daily - 7 x weekly - 1 sets - 10 reps - Seated Hip Abduction with Resistance  - 1 x daily - 7 x weekly - 1 sets - 20 reps - Seated Hamstring Curls with Resistance  - 1 x daily - 7 x weekly - 2 sets - 10 reps - Seated Long Arc Quad with Ankle Weight  - 1 x daily - 7 x weekly - 2 sets - 10 reps - Standing Hip Abduction with Counter Support  - 1 x daily - 7 x weekly - 2 sets - 10 reps - Supine Knee Extension Strengthening  - 1 x daily - 7 x weekly - 2 sets - 10 reps - Forward Step Down Touch with Toe  - 1 x daily - 7 x weekly - 1 sets - 10 reps - Seated Quad Set  - 1 x daily - 7 x weekly - 1 sets - 10 reps - 5 hold - Side Stepping with Resistance at Feet  - 1 x daily - 7 x weekly - 1 sets - 5 reps - Standing Gastroc Stretch at Counter  - 1 x daily - 7 x weekly - 1 sets - 2-3 reps - 30 hold - Standing Soleus Stretch on Foam 1/2 Roll  - 1 x daily - 7 x weekly - 1 sets - 2-3 reps - 30 hold  CLINICAL IMPRESSION: The patient rates her overall progress at  50%.  Her FOTO outcome has improved significantly from 55% at start of care to 70% today.  She has made gains in LE strength as well as ankle dorsiflexion ROM.  Her pain is primarily on the lateral aspect of her foot.   She has continued limitations that affect her ability to descend stairs, squat, balance and perform recreational activities like kayaking.  She would benefit from a further progression to address these limitations using a tapering of visits to promote independence with self management.      OBJECTIVE IMPAIRMENTS Abnormal gait, decreased activity tolerance, decreased balance, decreased endurance, decreased mobility, difficulty walking, decreased ROM, decreased strength,  hypomobility, increased muscle spasms, impaired flexibility, improper body mechanics, postural dysfunction, and pain.   ACTIVITY LIMITATIONS standing, stairs, and locomotion level  PARTICIPATION LIMITATIONS: community activity  PERSONAL FACTORS Age and Time since onset of injury/illness/exacerbation are also affecting patient's functional outcome.   REHAB POTENTIAL: Good  CLINICAL DECISION MAKING: Evolving/moderate complexity  EVALUATION COMPLEXITY: Moderate   GOALS: Goals reviewed with patient? Yes  SHORT TERM GOALS: Target date: 12/24/2021  Pt will be independent with her initial HEP to improve LE strength and flexibility.  Baseline: Goal status: met 8/4  LONG TERM GOALS: Target date:03/16/22   Pt will have improved Rt gastroc strength, evident by her ability to complete 15 single leg heel raises proficiently.  Baseline:  Goal status: ongoing  2.  Pt will have improved hip strength to 4/5 MMT. Baseline:  Goal status: goal met 8/22   3.  Pt will have increase in ankle dorsiflexion to atleast 13 deg for greater ease descending steps  Baseline:  Goal status: revised   4.  Pt will be able to descend clinic steps with improved eccentric control/strength 4+/5 quads without increase in foot/knee pain and min use of UE support Baseline:  Goal status: revised   5.  Pt will report atleast 50% improvement in her Rt foot pain from the start of PT. Baseline:  Goal status: met 8/22  6. Improved squat and ankle balance/stability needed to return to kayaking in the future.   NEW    PLAN: PT FREQUENCY: 2x/week  PT DURATION: 6 weeks  PLANNED INTERVENTIONS: Therapeutic exercises, Therapeutic activity, Neuromuscular re-education, Balance training, Gait training, Patient/Family education, Joint mobilization, Stair training, Aquatic Therapy, Dry Needling, Cryotherapy, Taping, and Manual therapy  PLAN FOR NEXT SESSION:  emphasize foot rehab (in moderation);  Nu-step; leg press;   info on aquatic PT possibly 1x/week with land PT, progress glute, quad and peroneal strength,  ankle DF ROM, STM as needed peroneals/gastrocs if needed    Ruben Im, PT 01/19/22 10:00 AM Phone: [phone] Fax: [place] [address] [place] [address] [place], [place] [address] Phone # [phone] Fax [phone]

## 2022-01-21 ENCOUNTER — Ambulatory Visit: Payer: PPO | Admitting: Physical Therapy

## 2022-01-21 DIAGNOSIS — M6281 Muscle weakness (generalized): Secondary | ICD-10-CM

## 2022-01-21 DIAGNOSIS — M79605 Pain in left leg: Secondary | ICD-10-CM

## 2022-01-21 DIAGNOSIS — M25671 Stiffness of right ankle, not elsewhere classified: Secondary | ICD-10-CM

## 2022-01-21 DIAGNOSIS — M79604 Pain in right leg: Secondary | ICD-10-CM

## 2022-01-21 NOTE — Therapy (Signed)
OUTPATIENT PHYSICAL THERAPY LOWER EXTREMITY TREATMENT NOTE   Patient Name: Jamie Glover MRN: [id number] DOB:06/15/1955, 66 y.o., female Today's Date: 01/21/2022   PT End of Session - 01/21/22 1016     Visit Number 11    Date for PT Re-Evaluation 03/16/22    Authorization Type [employer]    PT Start Time 1018    PT Stop Time 1057    PT Time Calculation (min) 39 min    Activity Tolerance Patient tolerated treatment well              Past Medical History:  Diagnosis Date   Arthritis    Hypertension    OSA (obstructive sleep apnea) 2013   Past Surgical History:  Procedure Laterality Date   BREAST BIOPSY     c-sections     CHOLECYSTECTOMY     CHOLECYSTECTOMY, LAPAROSCOPIC  1996   right knee surgery     Patient Active Problem List   Diagnosis Date Noted   Peroneal tendonitis, right 09/15/2021   Unilateral primary osteoarthritis, left knee 04/14/2020   Primary osteoarthritis of left knee 01/15/2019   Unilateral primary osteoarthritis, right knee 12/26/2017   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 ear 12/19/2017   Eustachian tube dysfunction, left 12/19/2017   Chronic serous otitis media of left ear 12/19/2017   Tinnitus 10/01/2011   Vitamin D deficiency 10/01/2011   GERD (gastroesophageal reflux disease) 10/01/2011   Neck pain 10/01/2011   HTN (hypertension) 10/01/2011   Sleep apnea 09/29/2011   BMI 33.0-33.9,adult 09/29/2011    PCP: Milagros Evener, MD  REFERRING PROVIDER: Faustino Congress, NP  REFERRING DIAG: Pain in Rt knee, Pain in Rt leg, Pain in Lt leg   THERAPY DIAG:  Pain in right leg  Pain in left leg  Stiffness of right ankle, not elsewhere classified  Muscle weakness (generalized)  Rationale for Evaluation and Treatment Rehabilitation  ONSET DATE: 2 years ago  SUBJECTIVE:   SUBJECTIVE STATEMENT: More foot pain today but may be b/c of walking more yesterday.      PERTINENT HISTORY:  Arthritis Rt knee, not much improvement with PT  Pt states that about 2 years ago, she was walking and felt a pop in her Rt foot. Some time went by and her foot was feeling a little better. She went on a trip and did a lot of walking which seemed to cause a flare-up in her foot pain. In January, she saw a podiatrist and was put in a boot for 3 weeks and had no improvement. She was sent to PT but was 1 of 4 pts at a time and felt that after 8 weeks she was not seeing much improvement. While at PT, she also noticed her Lt knee/outer leg was bothering her during her sessions. She has been on Mobic the last week and feels that her pain is better than usual because of this.   PAIN:  Are you having pain? Yes: NPRS scale: 3/10 Pain location: right knee and Rt lateral foot 5th toe to heel  Pain description: sore Aggravating factors: walking, heel raises, standing on 1 leg Relieving factors: resting off her feet  PRECAUTIONS: None  WEIGHT BEARING RESTRICTIONS No  FALLS:  Has patient fallen in last 6 months? No  LIVING ENVIRONMENT: Lives with: lives with their family Lives in: House/apartment Stairs: Yes: External: 5 steps;   Has following equipment at home: None  OCCUPATION: retired Marine scientist   PLOF: Independent  PATIENT GOALS be able to walk without increase in Rt and Lt LE pain    OBJECTIVE:   DIAGNOSTIC FINDINGS: MRI Rt foot  PATIENT SURVEYS:  FOTO 55    8/22:  70% FOTO  COGNITION:  Overall cognitive status: Within functional limits for tasks assessed     SENSATION: Denies numbness/tingling  EDEMA:  None noticed   POSTURE:  LE adduction, foot eversion  Right knee valgus  PALPATION: Tenderness over lateral aspect of the foot, at peroneal tendon insertion  LOWER EXTREMITY ROM:  Passive ROM Right eval Left eval 8/22  Hip flexion     Hip extension     Hip abduction     Hip adduction     Hip internal rotation     Hip external rotation      Knee flexion   125 bil   Knee extension     Ankle dorsiflexion 0 0 12 right  Ankle plantarflexion     Ankle inversion 25 (+pain)  30 28 right  Ankle eversion   38 right    (Blank rows = not tested)  LOWER EXTREMITY MMT:  MMT Right eval Left eval 8/4 8/22  Hip flexion      Hip extension 3/5 3/5 4 right/left 4+  Hip abduction 3/5 3/5 4 right/left 4+  Hip adduction      Hip internal rotation      Hip external rotation      Knee flexion 4/5 4/5 4+ right/left 4+  Knee extension 5/5 5/5 4 right/ 5 left 4 right   Ankle dorsiflexion 5/5     Ankle plantarflexion Unable to complete full range heel raise X10 reps     Ankle inversion 5/5     Ankle eversion 5/5      (Blank rows = not tested)  LOWER EXTREMITY SPECIAL TESTS:    FUNCTIONAL TESTS:  8/22 Single leg heel raises 10x proficient    GAIT: Distance walked:  Assistive device utilized:  Level of assistance:  Comments: Pt ambulates with decreased glute activation, (+) trendelenburg bilateral, hip adduction, foot eversion  Stairs: (+) trendelenburg, poor eccentric control bilaterally when descending steps    TODAY'S TREATMENT: 8/24: NuStep L3 (old model) x 5' PT present to discuss symptoms Runner's stretch on wall 2x 30 sec  [place]  with fingertip support and red band pinned under ball of the foot 8x Lateral step over hurdle to foam mats, mini squat 8x each way (to simulate getting in/out of kayak) Balancing on rocker board 45 sec; alternating heel raise and minisquat 10x  Manual therapy: stainless steel instrument to peroneals; talocrual joint mobs; passive inversion/eversion mob Cold pack to right foot for pain control 5 min  Neuromusc re-ed: glute medius, quads Therapeutic activity: curbs, steps, standing, walking      8/22:  FOTO, MMT; ROM; functional checks Discussion of progress toward goals and new goals set Single leg heel raises 10x proficient Runner's stretch on wall 2x 30 sec (added to HEP) Calf  stretch on slant board 2x 30 sec Leg press seat 7 65# both with vc's for correct alignment 20x, single leg press 30# vc's for alignment x 20 each Therapeutic activity: curbs, steps, standing, walking    PATIENT EDUCATION:  Education details: eval findings/POC; HEP Person educated: Patient Education method: Explanation Education comprehension: returned demonstration   HOME EXERCISE PROGRAM: Access Code: [email or web address] URL: [email or web address] Date: 01/19/2022 Prepared by: Ruben Im  Exercises - Supine Bridge with Resistance Band  - 1 x daily - 7  x weekly - 2-3 sets - 10 reps - Sidelying Hip Abduction  - 1 x daily - 7 x weekly - 1-2 sets - 10 reps - Supine Piriformis Stretch with Foot on Ground  - 1 x daily - 7 x weekly - 2 sets - 30 sec hold - Standing Knee Flexion Stretch on Step  - 1 x daily - 7 x weekly - 1 sets - 10 reps - Standing Isometric Hip Abduction with Ball on Wall  - 1 x daily - 7 x weekly - 1 sets - 5 reps - 5 hold - Long Sitting Isometric Ankle Inversion in Dorsiflexion with Ball at [place]  - 1 x daily - 7 x weekly - 1 sets - 10 reps - 5 hold - Long Sitting Isometric Ankle Eversion in Dorsiflexion with Ball at [place]  - 1 x daily - 7 x weekly - 3 sets - 10 reps - 5 hold - Sit to Stand  - 1 x daily - 7 x weekly - 1 sets - 10 reps - Seated Hip Abduction with Resistance  - 1 x daily - 7 x weekly - 1 sets - 20 reps - Seated Hamstring Curls with Resistance  - 1 x daily - 7 x weekly - 2 sets - 10 reps - Seated Long Arc Quad with Ankle Weight  - 1 x daily - 7 x weekly - 2 sets - 10 reps - Standing Hip Abduction with Counter Support  - 1 x daily - 7 x weekly - 2 sets - 10 reps - Supine Knee Extension Strengthening  - 1 x daily - 7 x weekly - 2 sets - 10 reps - Forward Step Down Touch with Toe  - 1 x daily - 7 x weekly - 1 sets - 10 reps - Seated Quad Set  - 1 x daily - 7 x weekly - 1 sets - 10 reps - 5 hold - Side Stepping with Resistance at Feet  - 1 x daily - 7 x  weekly - 1 sets - 5 reps - Standing Gastroc Stretch at Counter  - 1 x daily - 7 x weekly - 1 sets - 2-3 reps - 30 hold - Standing Soleus Stretch on Foam 1/2 Roll  - 1 x daily - 7 x weekly - 1 sets - 2-3 reps - 30 hold  CLINICAL IMPRESSION: Improved soft tissue mobility in peroneals and gastroc noted following myofascial release.  Treatment focus on lengthening gastroc/achilles to avoid overuse of peroneals.  Also emphasized foot intrinsic muscle strengthening and proprioception.  Therapist monitoring response to all  and modifying treatment accordingly.      OBJECTIVE IMPAIRMENTS Abnormal gait, decreased activity tolerance, decreased balance, decreased endurance, decreased mobility, difficulty walking, decreased ROM, decreased strength, hypomobility, increased muscle spasms, impaired flexibility, improper body mechanics, postural dysfunction, and pain.   ACTIVITY LIMITATIONS standing, stairs, and locomotion level  PARTICIPATION LIMITATIONS: community activity  PERSONAL FACTORS Age and Time since onset of injury/illness/exacerbation are also affecting patient's functional outcome.   REHAB POTENTIAL: Good  CLINICAL DECISION MAKING: Evolving/moderate complexity  EVALUATION COMPLEXITY: Moderate   GOALS: Goals reviewed with patient? Yes  SHORT TERM GOALS: Target date: 12/24/2021  Pt will be independent with her initial HEP to improve LE strength and flexibility.  Baseline: Goal status: met 8/4  LONG TERM GOALS: Target date:03/16/22   Pt will have improved Rt gastroc strength, evident by her ability to complete 15 single leg heel raises proficiently.  Baseline:  Goal status: ongoing  2.  Pt will have improved hip strength to 4/5 MMT. Baseline:  Goal status: goal met 8/22   3.  Pt will have increase in ankle dorsiflexion to atleast 13 deg for greater ease descending steps  Baseline:  Goal status: revised   4.  Pt will be able to descend clinic steps with improved eccentric  control/strength 4+/5 quads without increase in foot/knee pain and min use of UE support Baseline:  Goal status: revised   5.  Pt will report atleast 50% improvement in her Rt foot pain from the start of PT. Baseline:  Goal status: met 8/22  6. Improved squat and ankle balance/stability needed to return to kayaking in the future.   NEW    PLAN: PT FREQUENCY: 2x/week  PT DURATION: 6 weeks  PLANNED INTERVENTIONS: Therapeutic exercises, Therapeutic activity, Neuromuscular re-education, Balance training, Gait training, Patient/Family education, Joint mobilization, Stair training, Aquatic Therapy, Dry Needling, Cryotherapy, Taping, and Manual therapy  PLAN FOR NEXT SESSION:  emphasize foot rehab (in moderation);  Nu-step; leg press;  info on aquatic PT possibly 1x/week with land PT, progress glute, quad and peroneal strength,  ankle DF ROM, STM as needed peroneals/gastrocs if needed; possible DN   Ruben Im, PT 01/21/22 10:57 AM Phone: [phone] Fax: [place] [address], [place] [address] [place], [place] [address] Phone # [phone] Fax [phone]

## 2022-01-26 ENCOUNTER — Ambulatory Visit: Payer: PPO | Admitting: Physical Therapy

## 2022-01-26 DIAGNOSIS — M79604 Pain in right leg: Secondary | ICD-10-CM

## 2022-01-26 DIAGNOSIS — M79605 Pain in left leg: Secondary | ICD-10-CM

## 2022-01-26 DIAGNOSIS — M6281 Muscle weakness (generalized): Secondary | ICD-10-CM

## 2022-01-26 DIAGNOSIS — M25671 Stiffness of right ankle, not elsewhere classified: Secondary | ICD-10-CM

## 2022-01-26 NOTE — Therapy (Signed)
OUTPATIENT PHYSICAL THERAPY LOWER EXTREMITY TREATMENT NOTE   Patient Name: Jamie Glover MRN: [id number] DOB:April 03, 1956, 66 y.o., female Today's Date: 01/26/2022   PT End of Session - 01/26/22 1018     Visit Number 12    Date for PT Re-Evaluation 03/16/22    Authorization Type [employer]    PT Start Time 1015    PT Stop Time 1055    PT Time Calculation (min) 40 min    Activity Tolerance Patient tolerated treatment well              Past Medical History:  Diagnosis Date   Arthritis    Hypertension    OSA (obstructive sleep apnea) 2013   Past Surgical History:  Procedure Laterality Date   BREAST BIOPSY     c-sections     CHOLECYSTECTOMY     CHOLECYSTECTOMY, LAPAROSCOPIC  1996   right knee surgery     Patient Active Problem List   Diagnosis Date Noted   Peroneal tendonitis, right 09/15/2021   Unilateral primary osteoarthritis, left knee 04/14/2020   Primary osteoarthritis of left knee 01/15/2019   Unilateral primary osteoarthritis, right knee 12/26/2017   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 ear 12/19/2017   Eustachian tube dysfunction, left 12/19/2017   Chronic serous otitis media of left ear 12/19/2017   Tinnitus 10/01/2011   Vitamin D deficiency 10/01/2011   GERD (gastroesophageal reflux disease) 10/01/2011   Neck pain 10/01/2011   HTN (hypertension) 10/01/2011   Sleep apnea 09/29/2011   BMI 33.0-33.9,adult 09/29/2011    PCP: Milagros Evener, MD  REFERRING PROVIDER: Faustino Congress, NP  REFERRING DIAG: Pain in Rt knee, Pain in Rt leg, Pain in Lt leg   THERAPY DIAG:  Pain in right leg  Pain in left leg  Stiffness of right ankle, not elsewhere classified  Muscle weakness (generalized)  Rationale for Evaluation and Treatment Rehabilitation  ONSET DATE: 2 years ago  SUBJECTIVE:   SUBJECTIVE STATEMENT: I can tell my quads are getting stronger.  I can stand and balance to put  my pants on now.  I'm aware of the foot pain; rates at 1/10.   PERTINENT HISTORY:  Arthritis Rt knee, not much improvement with PT  Pt states that about 2 years ago, she was walking and felt a pop in her Rt foot. Some time went by and her foot was feeling a little better. She went on a trip and did a lot of walking which seemed to cause a flare-up in her foot pain. In January, she saw a podiatrist and was put in a boot for 3 weeks and had no improvement. She was sent to PT but was 1 of 4 pts at a time and felt that after 8 weeks she was not seeing much improvement. While at PT, she also noticed her Lt knee/outer leg was bothering her during her sessions. She has been on Mobic the last week and feels that her pain is better than usual because of this.   PAIN:  Are you having pain? Yes: NPRS scale: 1/10 Pain location: right knee and Rt lateral foot 5th toe to heel  Pain description: sore Aggravating factors: walking, heel raises, standing on 1 leg Relieving factors: resting off her feet  PRECAUTIONS: None  WEIGHT BEARING RESTRICTIONS No  FALLS:  Has patient fallen in last 6 months? No  LIVING ENVIRONMENT: Lives with: lives with their family Lives in: House/apartment Stairs: Yes: External: 5 steps;  Has following equipment at home: None  OCCUPATION: retired Marine scientist   PLOF: Independent  PATIENT GOALS be able to walk without increase in Rt and Lt LE pain    OBJECTIVE:   DIAGNOSTIC FINDINGS: MRI Rt foot  PATIENT SURVEYS:  FOTO 55    8/22:  70% FOTO  COGNITION:  Overall cognitive status: Within functional limits for tasks assessed     SENSATION: Denies numbness/tingling  EDEMA:  None noticed   POSTURE:  LE adduction, foot eversion  Right knee valgus  PALPATION: Tenderness over lateral aspect of the foot, at peroneal tendon insertion  LOWER EXTREMITY ROM:  Passive ROM Right eval Left eval 8/22  Hip flexion     Hip extension     Hip abduction     Hip adduction      Hip internal rotation     Hip external rotation     Knee flexion   125 bil   Knee extension     Ankle dorsiflexion 0 0 12 right  Ankle plantarflexion     Ankle inversion 25 (+pain)  30 28 right  Ankle eversion   38 right    (Blank rows = not tested)  LOWER EXTREMITY MMT:  MMT Right eval Left eval 8/4 8/22  Hip flexion      Hip extension 3/5 3/5 4 right/left 4+  Hip abduction 3/5 3/5 4 right/left 4+  Hip adduction      Hip internal rotation      Hip external rotation      Knee flexion 4/5 4/5 4+ right/left 4+  Knee extension 5/5 5/5 4 right/ 5 left 4 right   Ankle dorsiflexion 5/5     Ankle plantarflexion Unable to complete full range heel raise X10 reps     Ankle inversion 5/5     Ankle eversion 5/5      (Blank rows = not tested)  LOWER EXTREMITY SPECIAL TESTS:    FUNCTIONAL TESTS:  8/22 Single leg heel raises 10x proficient    GAIT: Distance walked:  Assistive device utilized:  Level of assistance:  Comments: Pt ambulates with decreased glute activation, (+) trendelenburg bilateral, hip adduction, foot eversion  Stairs: (+) trendelenburg, poor eccentric control bilaterally when descending steps    TODAY'S TREATMENT: 8/29: NuStep L3 (old model) x 5' PT present to discuss symptoms Runner's  toes stretch on wall 3x 30 sec  Ankle dynamic DF on chair 45 sec  Bil heel raise with small green soft ball between heels x10  Lateral step up onto BOSU 5x  Lateral step back to kneel on 2 blue cushions and using bar to assist back up (to simulate getting up off the floor) Floor slider 3 ways 10x on 1 side, 8x on other side Leg press machine:  calf press 15# 2x10 on each side;  65# bil 15x leg press;  single leg press 30#  15x each side  Yellow loop side stepping 10x Neuromusc re-ed: glute medius, quads, foot instrinsics, gastroc Therapeutic activity: getting in/out of kayak standing, walking        8/24: NuStep L3 (old model) x 5' PT present to discuss  symptoms Runner's stretch on wall 2x 30 sec  [place]  with fingertip support and red band pinned under ball of the foot 8x Lateral step over hurdle to foam mats, mini squat 8x each way (to simulate getting in/out of kayak) Balancing on rocker board 45 sec; alternating heel raise and minisquat 10x  Manual therapy: stainless steel instrument to  peroneals; talocrual joint mobs; passive inversion/eversion mob Cold pack to right foot for pain control 5 min  Neuromusc re-ed: glute medius, quads Therapeutic activity: curbs, steps, standing, walking      8/22:  FOTO, MMT; ROM; functional checks Discussion of progress toward goals and new goals set Single leg heel raises 10x proficient Runner's stretch on wall 2x 30 sec (added to HEP) Calf stretch on slant board 2x 30 sec Leg press seat 7 65# both with vc's for correct alignment 20x, single leg press 30# vc's for alignment x 20 each Therapeutic activity: curbs, steps, standing, walking    PATIENT EDUCATION:  Education details: eval findings/POC; HEP Person educated: Patient Education method: Explanation Education comprehension: returned demonstration   HOME EXERCISE PROGRAM: Access Code: [email or web address] URL: [email or web address] Date: 01/19/2022 Prepared by: Ruben Im  Exercises - Supine Bridge with Resistance Band  - 1 x daily - 7 x weekly - 2-3 sets - 10 reps - Sidelying Hip Abduction  - 1 x daily - 7 x weekly - 1-2 sets - 10 reps - Supine Piriformis Stretch with Foot on Ground  - 1 x daily - 7 x weekly - 2 sets - 30 sec hold - Standing Knee Flexion Stretch on Step  - 1 x daily - 7 x weekly - 1 sets - 10 reps - Standing Isometric Hip Abduction with Ball on Wall  - 1 x daily - 7 x weekly - 1 sets - 5 reps - 5 hold - Long Sitting Isometric Ankle Inversion in Dorsiflexion with Ball at [place]  - 1 x daily - 7 x weekly - 1 sets - 10 reps - 5 hold - Long Sitting Isometric Ankle Eversion in Dorsiflexion with Ball at [place]   - 1 x daily - 7 x weekly - 3 sets - 10 reps - 5 hold - Sit to Stand  - 1 x daily - 7 x weekly - 1 sets - 10 reps - Seated Hip Abduction with Resistance  - 1 x daily - 7 x weekly - 1 sets - 20 reps - Seated Hamstring Curls with Resistance  - 1 x daily - 7 x weekly - 2 sets - 10 reps - Seated Long Arc Quad with Ankle Weight  - 1 x daily - 7 x weekly - 2 sets - 10 reps - Standing Hip Abduction with Counter Support  - 1 x daily - 7 x weekly - 2 sets - 10 reps - Supine Knee Extension Strengthening  - 1 x daily - 7 x weekly - 2 sets - 10 reps - Forward Step Down Touch with Toe  - 1 x daily - 7 x weekly - 1 sets - 10 reps - Seated Quad Set  - 1 x daily - 7 x weekly - 1 sets - 10 reps - 5 hold - Side Stepping with Resistance at Feet  - 1 x daily - 7 x weekly - 1 sets - 5 reps - Standing Gastroc Stretch at Counter  - 1 x daily - 7 x weekly - 1 sets - 2-3 reps - 30 hold - Standing Soleus Stretch on Foam 1/2 Roll  - 1 x daily - 7 x weekly - 1 sets - 2-3 reps - 30 hold  CLINICAL IMPRESSION: Remains highly motivated with a steady progression of exercise challenge and intensity.  Therapist monitoring response to ex and modifying as needed based on pain levels and muscular fatigue.      OBJECTIVE IMPAIRMENTS Abnormal gait, decreased activity  tolerance, decreased balance, decreased endurance, decreased mobility, difficulty walking, decreased ROM, decreased strength, hypomobility, increased muscle spasms, impaired flexibility, improper body mechanics, postural dysfunction, and pain.   ACTIVITY LIMITATIONS standing, stairs, and locomotion level  PARTICIPATION LIMITATIONS: community activity  PERSONAL FACTORS Age and Time since onset of injury/illness/exacerbation are also affecting patient's functional outcome.   REHAB POTENTIAL: Good  CLINICAL DECISION MAKING: Evolving/moderate complexity  EVALUATION COMPLEXITY: Moderate   GOALS: Goals reviewed with patient? Yes  SHORT TERM GOALS: Target date:  12/24/2021  Pt will be independent with her initial HEP to improve LE strength and flexibility.  Baseline: Goal status: met 8/4  LONG TERM GOALS: Target date:03/16/22   Pt will have improved Rt gastroc strength, evident by her ability to complete 15 single leg heel raises proficiently.  Baseline:  Goal status: ongoing  2.  Pt will have improved hip strength to 4/5 MMT. Baseline:  Goal status: goal met 8/22   3.  Pt will have increase in ankle dorsiflexion to atleast 13 deg for greater ease descending steps  Baseline:  Goal status: revised   4.  Pt will be able to descend clinic steps with improved eccentric control/strength 4+/5 quads without increase in foot/knee pain and min use of UE support Baseline:  Goal status: revised   5.  Pt will report atleast 50% improvement in her Rt foot pain from the start of PT. Baseline:  Goal status: met 8/22  6. Improved squat and ankle balance/stability needed to return to kayaking in the future.   NEW    PLAN: PT FREQUENCY: 2x/week  PT DURATION: 6 weeks  PLANNED INTERVENTIONS: Therapeutic exercises, Therapeutic activity, Neuromuscular re-education, Balance training, Gait training, Patient/Family education, Joint mobilization, Stair training, Aquatic Therapy, Dry Needling, Cryotherapy, Taping, and Manual therapy  PLAN FOR NEXT SESSION:  emphasize foot rehab (in moderation);  Nu-step; leg press/calf press;  progress glute, quad and peroneal strength,  ankle DF ROM, STM as needed peroneals/gastrocs if needed; possible DN  Ruben Im, PT 01/26/22 12:59 PM Phone: [phone] Fax: [place] [address] [place] [address], [place] [address] Phone # [phone] Fax [phone]

## 2022-01-28 ENCOUNTER — Ambulatory Visit: Payer: PPO | Admitting: Physical Therapy

## 2022-01-28 DIAGNOSIS — M79605 Pain in left leg: Secondary | ICD-10-CM

## 2022-01-28 DIAGNOSIS — M25671 Stiffness of right ankle, not elsewhere classified: Secondary | ICD-10-CM

## 2022-01-28 DIAGNOSIS — M79604 Pain in right leg: Secondary | ICD-10-CM

## 2022-01-28 DIAGNOSIS — M6281 Muscle weakness (generalized): Secondary | ICD-10-CM

## 2022-01-28 NOTE — Therapy (Signed)
OUTPATIENT PHYSICAL THERAPY LOWER EXTREMITY TREATMENT NOTE   Patient Name: Jamie Glover MRN: [id number] DOB:May 23, 1956, 66 y.o., female Today's Date: 01/28/2022   PT End of Session - 01/28/22 1024     Visit Number 13    Date for PT Re-Evaluation 03/16/22    Authorization Type [employer]    PT Start Time 1020    PT Stop Time 1100    PT Time Calculation (min) 40 min    Activity Tolerance Patient tolerated treatment well              Past Medical History:  Diagnosis Date   Arthritis    Hypertension    OSA (obstructive sleep apnea) 2013   Past Surgical History:  Procedure Laterality Date   BREAST BIOPSY     c-sections     CHOLECYSTECTOMY     CHOLECYSTECTOMY, LAPAROSCOPIC  1996   right knee surgery     Patient Active Problem List   Diagnosis Date Noted   Peroneal tendonitis, right 09/15/2021   Unilateral primary osteoarthritis, left knee 04/14/2020   Primary osteoarthritis of left knee 01/15/2019   Unilateral primary osteoarthritis, right knee 12/26/2017   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 ear 12/19/2017   Eustachian tube dysfunction, left 12/19/2017   Chronic serous otitis media of left ear 12/19/2017   Tinnitus 10/01/2011   Vitamin D deficiency 10/01/2011   GERD (gastroesophageal reflux disease) 10/01/2011   Neck pain 10/01/2011   HTN (hypertension) 10/01/2011   Sleep apnea 09/29/2011   BMI 33.0-33.9,adult 09/29/2011    PCP: Milagros Evener, MD  REFERRING PROVIDER: Faustino Congress, NP  REFERRING DIAG: Pain in Rt knee, Pain in Rt leg, Pain in Lt leg   THERAPY DIAG:  Pain in right leg  Pain in left leg  Stiffness of right ankle, not elsewhere classified  Muscle weakness (generalized)  Rationale for Evaluation and Treatment Rehabilitation  ONSET DATE: 2 years ago  SUBJECTIVE:   SUBJECTIVE STATEMENT: I walked foot over foot down the steps without holding on [place] the movie  theater.    Last session was the "right amount of hard."  PERTINENT HISTORY:  Arthritis Rt knee, not much improvement with PT  Pt states that about 2 years ago, she was walking and felt a pop in her Rt foot. Some time went by and her foot was feeling a little better. She went on a trip and did a lot of walking which seemed to cause a flare-up in her foot pain. In January, she saw a podiatrist and was put in a boot for 3 weeks and had no improvement. She was sent to PT but was 1 of 4 pts at a time and felt that after 8 weeks she was not seeing much improvement. While at PT, she also noticed her Lt knee/outer leg was bothering her during her sessions. She has been on Mobic the last week and feels that her pain is better than usual because of this.   PAIN:  Are you having pain? Yes: NPRS scale: 1/10 Pain location: right knee and Rt lateral foot 5th toe to heel  Pain description: sore Aggravating factors: walking, heel raises, standing on 1 leg Relieving factors: resting off her feet  PRECAUTIONS: None  WEIGHT BEARING RESTRICTIONS No  FALLS:  Has patient fallen in last 6 months? No  LIVING ENVIRONMENT: Lives with: lives with their family Lives in: House/apartment Stairs: Yes: External: 5 steps;   Has following equipment  at home: None  OCCUPATION: retired Marine scientist   PLOF: Independent  PATIENT GOALS be able to walk without increase in Rt and Lt LE pain    OBJECTIVE:   DIAGNOSTIC FINDINGS: MRI Rt foot  PATIENT SURVEYS:  FOTO 55    8/22:  70% FOTO  COGNITION:  Overall cognitive status: Within functional limits for tasks assessed     SENSATION: Denies numbness/tingling  EDEMA:  None noticed   POSTURE:  LE adduction, foot eversion  Right knee valgus  PALPATION: Tenderness over lateral aspect of the foot, at peroneal tendon insertion  LOWER EXTREMITY ROM:  Passive ROM Right eval Left eval 8/22  Hip flexion     Hip extension     Hip abduction     Hip adduction      Hip internal rotation     Hip external rotation     Knee flexion   125 bil   Knee extension     Ankle dorsiflexion 0 0 12 right  Ankle plantarflexion     Ankle inversion 25 (+pain)  30 28 right  Ankle eversion   38 right    (Blank rows = not tested)  LOWER EXTREMITY MMT:  MMT Right eval Left eval 8/4 8/22  Hip flexion      Hip extension 3/5 3/5 4 right/left 4+  Hip abduction 3/5 3/5 4 right/left 4+  Hip adduction      Hip internal rotation      Hip external rotation      Knee flexion 4/5 4/5 4+ right/left 4+  Knee extension 5/5 5/5 4 right/ 5 left 4 right   Ankle dorsiflexion 5/5     Ankle plantarflexion Unable to complete full range heel raise X10 reps     Ankle inversion 5/5     Ankle eversion 5/5      (Blank rows = not tested)  LOWER EXTREMITY SPECIAL TESTS:    FUNCTIONAL TESTS:  8/22 Single leg heel raises 10x proficient    GAIT: Distance walked:  Assistive device utilized:  Level of assistance:  Comments: Pt ambulates with decreased glute activation, (+) trendelenburg bilateral, hip adduction, foot eversion  Stairs: (+) trendelenburg, poor eccentric control bilaterally when descending steps    TODAY'S TREATMENT: 8/31: NuStep L3 (old model) x 5' PT present to discuss symptoms 3 way dynamic wall stretch 10x each side  Cable pulley assisted retro lunge (1 side painful limited to 5 reps) Painful with RDL so modified with dead lift 5# 10x to ankle level 10x Box eccentric heel lifts and lowers 5x each direction Bil heel raise with small green soft ball between heels2  x10  Leaning on bar with red loop around knees hydrants 20x right/left Upside down BOSU left side PF/DF, inversion/eversion 10x bil UE support (very challenging) Leg press machine:  calf press 15# x10 on each side;  70# bil 15x leg press;  single leg press 35#  15x each side  Neuromusc re-ed: glute medius, quads, foot instrinsics, gastroc Therapeutic activity: getting in/out of kayak standing,  walking       8/29: NuStep L3 (old model) x 5' PT present to discuss symptoms Runner's  toes stretch on wall 3x 30 sec  Ankle dynamic DF on chair 45 sec  Bil heel raise with small green soft ball between heels x10  Lateral step up onto BOSU 5x  Lateral step back to kneel on 2 blue cushions and using bar to assist back up (to simulate getting up off the floor)  Floor slider 3 ways 10x on 1 side, 8x on other side Leg press machine:  calf press 15# 2x10 on each side;  65# bil 15x leg press;  single leg press 30#  15x each side  Yellow loop side stepping 10x Neuromusc re-ed: glute medius, quads, foot instrinsics, gastroc Therapeutic activity: getting in/out of kayak standing, walking       8/22:  FOTO, MMT; ROM; functional checks Discussion of progress toward goals and new goals set Single leg heel raises 10x proficient Runner's stretch on wall 2x 30 sec (added to HEP) Calf stretch on slant board 2x 30 sec Leg press seat 7 65# both with vc's for correct alignment 20x, single leg press 30# vc's for alignment x 20 each Therapeutic activity: curbs, steps, standing, walking    PATIENT EDUCATION:  Education details: eval findings/POC; HEP Person educated: Patient Education method: Explanation Education comprehension: returned demonstration   HOME EXERCISE PROGRAM: Access Code: [email or web address] URL: [email or web address] Date: 01/19/2022 Prepared by: Ruben Im  Exercises - Supine Bridge with Resistance Band  - 1 x daily - 7 x weekly - 2-3 sets - 10 reps - Sidelying Hip Abduction  - 1 x daily - 7 x weekly - 1-2 sets - 10 reps - Supine Piriformis Stretch with Foot on Ground  - 1 x daily - 7 x weekly - 2 sets - 30 sec hold - Standing Knee Flexion Stretch on Step  - 1 x daily - 7 x weekly - 1 sets - 10 reps - Standing Isometric Hip Abduction with Ball on Wall  - 1 x daily - 7 x weekly - 1 sets - 5 reps - 5 hold - Long Sitting Isometric Ankle Inversion in Dorsiflexion  with Ball at [place]  - 1 x daily - 7 x weekly - 1 sets - 10 reps - 5 hold - Long Sitting Isometric Ankle Eversion in Dorsiflexion with Ball at [place]  - 1 x daily - 7 x weekly - 3 sets - 10 reps - 5 hold - Sit to Stand  - 1 x daily - 7 x weekly - 1 sets - 10 reps - Seated Hip Abduction with Resistance  - 1 x daily - 7 x weekly - 1 sets - 20 reps - Seated Hamstring Curls with Resistance  - 1 x daily - 7 x weekly - 2 sets - 10 reps - Seated Long Arc Quad with Ankle Weight  - 1 x daily - 7 x weekly - 2 sets - 10 reps - Standing Hip Abduction with Counter Support  - 1 x daily - 7 x weekly - 2 sets - 10 reps - Supine Knee Extension Strengthening  - 1 x daily - 7 x weekly - 2 sets - 10 reps - Forward Step Down Touch with Toe  - 1 x daily - 7 x weekly - 1 sets - 10 reps - Seated Quad Set  - 1 x daily - 7 x weekly - 1 sets - 10 reps - 5 hold - Side Stepping with Resistance at Feet  - 1 x daily - 7 x weekly - 1 sets - 5 reps - Standing Gastroc Stretch at Counter  - 1 x daily - 7 x weekly - 1 sets - 2-3 reps - 30 hold - Standing Soleus Stretch on Foam 1/2 Roll  - 1 x daily - 7 x weekly - 1 sets - 2-3 reps - 30 hold  CLINICAL IMPRESSION: Continued progression of ankle/foot mobility and strengthening in  addition to knee/hip strengthening.  Modifications made secondary to left knee pain particularly with SLS activities.  Functional strengthening to help with future return to kayaking. Therapist providing close supervision for safety with balancing activities.   Patient remains highly motivated with rehab.      OBJECTIVE IMPAIRMENTS Abnormal gait, decreased activity tolerance, decreased balance, decreased endurance, decreased mobility, difficulty walking, decreased ROM, decreased strength, hypomobility, increased muscle spasms, impaired flexibility, improper body mechanics, postural dysfunction, and pain.   ACTIVITY LIMITATIONS standing, stairs, and locomotion level  PARTICIPATION LIMITATIONS: community  activity  PERSONAL FACTORS Age and Time since onset of injury/illness/exacerbation are also affecting patient's functional outcome.   REHAB POTENTIAL: Good  CLINICAL DECISION MAKING: Evolving/moderate complexity  EVALUATION COMPLEXITY: Moderate   GOALS: Goals reviewed with patient? Yes  SHORT TERM GOALS: Target date: 12/24/2021  Pt will be independent with her initial HEP to improve LE strength and flexibility.  Baseline: Goal status: met 8/4  LONG TERM GOALS: Target date:03/16/22   Pt will have improved Rt gastroc strength, evident by her ability to complete 15 single leg heel raises proficiently.  Baseline:  Goal status: ongoing  2.  Pt will have improved hip strength to 4/5 MMT. Baseline:  Goal status: goal met 8/22   3.  Pt will have increase in ankle dorsiflexion to atleast 13 deg for greater ease descending steps  Baseline:  Goal status: revised   4.  Pt will be able to descend clinic steps with improved eccentric control/strength 4+/5 quads without increase in foot/knee pain and min use of UE support Baseline:  Goal status: revised   5.  Pt will report atleast 50% improvement in her Rt foot pain from the start of PT. Baseline:  Goal status: met 8/22  6. Improved squat and ankle balance/stability needed to return to kayaking in the future.   NEW    PLAN: PT FREQUENCY: 2x/week  PT DURATION: 6 weeks  PLANNED INTERVENTIONS: Therapeutic exercises, Therapeutic activity, Neuromuscular re-education, Balance training, Gait training, Patient/Family education, Joint mobilization, Stair training, Aquatic Therapy, Dry Needling, Cryotherapy, Taping, and Manual therapy  PLAN FOR NEXT SESSION:  emphasize foot rehab (in moderation);  Nu-step; leg press/calf press;  progress glute, quad and peroneal strength,  ankle DF ROM, STM as needed peroneals/gastrocs if needed; possible DN   Ruben Im, PT 01/28/22 2:09 PM Phone: [phone] Fax: [phone]

## 2022-02-04 ENCOUNTER — Ambulatory Visit: Payer: PPO | Attending: Family Medicine

## 2022-02-04 DIAGNOSIS — M6281 Muscle weakness (generalized): Secondary | ICD-10-CM | POA: Diagnosis not present

## 2022-02-04 DIAGNOSIS — M79604 Pain in right leg: Secondary | ICD-10-CM | POA: Diagnosis not present

## 2022-02-04 DIAGNOSIS — M25671 Stiffness of right ankle, not elsewhere classified: Secondary | ICD-10-CM | POA: Insufficient documentation

## 2022-02-04 DIAGNOSIS — R262 Difficulty in walking, not elsewhere classified: Secondary | ICD-10-CM | POA: Diagnosis not present

## 2022-02-04 DIAGNOSIS — M79605 Pain in left leg: Secondary | ICD-10-CM | POA: Diagnosis not present

## 2022-02-04 NOTE — Therapy (Signed)
OUTPATIENT PHYSICAL THERAPY LOWER EXTREMITY TREATMENT NOTE   Patient Name: Jamie Glover MRN: [id number] DOB:1956-02-27, 66 y.o., female Today's Date: 02/04/2022   PT End of Session - 02/04/22 1455     Visit Number 14    Date for PT Re-Evaluation 03/16/22    Authorization Type [employer]    Authorization Time Period 12/08/21 to 01/20/22 extended through 10/17    PT Start Time 1452    PT Stop Time 1530    PT Time Calculation (min) 38 min    Activity Tolerance Patient tolerated treatment well    Behavior During Therapy [facility] for tasks assessed/performed              Past Medical History:  Diagnosis Date   Arthritis    Hypertension    OSA (obstructive sleep apnea) 2013   Past Surgical History:  Procedure Laterality Date   BREAST BIOPSY     c-sections     CHOLECYSTECTOMY     CHOLECYSTECTOMY, LAPAROSCOPIC  1996   right knee surgery     Patient Active Problem List   Diagnosis Date Noted   Peroneal tendonitis, right 09/15/2021   Unilateral primary osteoarthritis, left knee 04/14/2020   Primary osteoarthritis of left knee 01/15/2019   Unilateral primary osteoarthritis, right knee 12/26/2017   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 ear 12/19/2017   Eustachian tube dysfunction, left 12/19/2017   Chronic serous otitis media of left ear 12/19/2017   Tinnitus 10/01/2011   Vitamin D deficiency 10/01/2011   GERD (gastroesophageal reflux disease) 10/01/2011   Neck pain 10/01/2011   HTN (hypertension) 10/01/2011   Sleep apnea 09/29/2011   BMI 33.0-33.9,adult 09/29/2011    PCP: Milagros Evener, MD  REFERRING PROVIDER: Faustino Congress, NP  REFERRING DIAG: Pain in Rt knee, Pain in Rt leg, Pain in Lt leg   THERAPY DIAG:  Pain in right leg  Pain in left leg  Stiffness of right ankle, not elsewhere classified  Muscle weakness (generalized)  Difficulty in walking, not elsewhere classified  Rationale for  Evaluation and Treatment Rehabilitation  ONSET DATE: 2 years ago  SUBJECTIVE:   SUBJECTIVE STATEMENT: Patient states she is doing well.  She walked this morning and didn't have any issues.   PERTINENT HISTORY:  Arthritis Rt knee, not much improvement with PT  Pt states that about 2 years ago, she was walking and felt a pop in her Rt foot. Some time went by and her foot was feeling a little better. She went on a trip and did a lot of walking which seemed to cause a flare-up in her foot pain. In January, she saw a podiatrist and was put in a boot for 3 weeks and had no improvement. She was sent to PT but was 1 of 4 pts at a time and felt that after 8 weeks she was not seeing much improvement. While at PT, she also noticed her Lt knee/outer leg was bothering her during her sessions. She has been on Mobic the last week and feels that her pain is better than usual because of this.   PAIN:  Are you having pain? Yes: NPRS scale: 1/10 Pain location: right knee and Rt lateral foot 5th toe to heel  Pain description: sore Aggravating factors: walking, heel raises, standing on 1 leg Relieving factors: resting off her feet  PRECAUTIONS: None  WEIGHT BEARING RESTRICTIONS No  FALLS:  Has patient fallen in last 6 months? No  LIVING  ENVIRONMENT: Lives with: lives with their family Lives in: House/apartment Stairs: Yes: External: 5 steps;   Has following equipment at home: None  OCCUPATION: retired Marine scientist   PLOF: Independent  PATIENT GOALS be able to walk without increase in Rt and Lt LE pain    OBJECTIVE:   DIAGNOSTIC FINDINGS: MRI Rt foot  PATIENT SURVEYS:  FOTO 55    8/22:  70% FOTO  COGNITION:  Overall cognitive status: Within functional limits for tasks assessed     SENSATION: Denies numbness/tingling  EDEMA:  None noticed   POSTURE:  LE adduction, foot eversion  Right knee valgus  PALPATION: Tenderness over lateral aspect of the foot, at peroneal tendon  insertion  LOWER EXTREMITY ROM:  Passive ROM Right eval Left eval 8/22  Hip flexion     Hip extension     Hip abduction     Hip adduction     Hip internal rotation     Hip external rotation     Knee flexion   125 bil   Knee extension     Ankle dorsiflexion 0 0 12 right  Ankle plantarflexion     Ankle inversion 25 (+pain)  30 28 right  Ankle eversion   38 right    (Blank rows = not tested)  LOWER EXTREMITY MMT:  MMT Right eval Left eval 8/4 8/22  Hip flexion      Hip extension 3/5 3/5 4 right/left 4+  Hip abduction 3/5 3/5 4 right/left 4+  Hip adduction      Hip internal rotation      Hip external rotation      Knee flexion 4/5 4/5 4+ right/left 4+  Knee extension 5/5 5/5 4 right/ 5 left 4 right   Ankle dorsiflexion 5/5     Ankle plantarflexion Unable to complete full range heel raise X10 reps     Ankle inversion 5/5     Ankle eversion 5/5      (Blank rows = not tested)  LOWER EXTREMITY SPECIAL TESTS:    FUNCTIONAL TESTS:  8/22 Single leg heel raises 10x proficient    GAIT: Distance walked:  Assistive device utilized:  Level of assistance:  Comments: Pt ambulates with decreased glute activation, (+) trendelenburg bilateral, hip adduction, foot eversion  Stairs: (+) trendelenburg, poor eccentric control bilaterally when descending steps    TODAY'S TREATMENT: 9/7: NuStep L3 (old model) x 5' PT present to discuss symptoms Lunge to BOSU x 10 fwd and then lateral each LE (modified to reduce shearing force on knees) Mini squats on balance pad 2 x 10 Step up and hold fwd then lateral x 10 each LE on balance pad Simulated getting in /out of kayak to strategize safe method to do so.  Used 8 inches of step on each side and used hurdles to simulate front and back of opening of kayak.    8/31: NuStep L3 (old model) x 5' PT present to discuss symptoms 3 way dynamic wall stretch 10x each side  Cable pulley assisted retro lunge (1 side painful limited to 5  reps) Painful with RDL so modified with dead lift 5# 10x to ankle level 10x Box eccentric heel lifts and lowers 5x each direction Bil heel raise with small green soft ball between heels2  x10  Leaning on bar with red loop around knees hydrants 20x right/left Upside down BOSU left side PF/DF, inversion/eversion 10x bil UE support (very challenging) Leg press machine:  calf press 15# x10 on each side;  70# bil 15x leg press;  single leg press 35#  15x each side  Neuromusc re-ed: glute medius, quads, foot instrinsics, gastroc Therapeutic activity: getting in/out of kayak standing, walking  8/29: NuStep L3 (old model) x 5' PT present to discuss symptoms Runner's  toes stretch on wall 3x 30 sec  Ankle dynamic DF on chair 45 sec  Bil heel raise with small green soft ball between heels x10  Lateral step up onto BOSU 5x  Lateral step back to kneel on 2 blue cushions and using bar to assist back up (to simulate getting up off the floor) Floor slider 3 ways 10x on 1 side, 8x on other side Leg press machine:  calf press 15# 2x10 on each side;  65# bil 15x leg press;  single leg press 30#  15x each side  Yellow loop side stepping 10x Neuromusc re-ed: glute medius, quads, foot instrinsics, gastroc Therapeutic activity: getting in/out of kayak standing, walking   PATIENT EDUCATION:  Education details: eval findings/POC; HEP Person educated: Patient Education method: Explanation Education comprehension: returned demonstration   HOME EXERCISE PROGRAM: Access Code: [email or web address] URL: [email or web address] Date: 01/19/2022 Prepared by: Ruben Im  Exercises - Supine Bridge with Resistance Band  - 1 x daily - 7 x weekly - 2-3 sets - 10 reps - Sidelying Hip Abduction  - 1 x daily - 7 x weekly - 1-2 sets - 10 reps - Supine Piriformis Stretch with Foot on Ground  - 1 x daily - 7 x weekly - 2 sets - 30 sec hold - Standing Knee Flexion Stretch on Step  - 1 x daily - 7 x weekly - 1 sets -  10 reps - Standing Isometric Hip Abduction with Ball on Wall  - 1 x daily - 7 x weekly - 1 sets - 5 reps - 5 hold - Long Sitting Isometric Ankle Inversion in Dorsiflexion with Ball at [place]  - 1 x daily - 7 x weekly - 1 sets - 10 reps - 5 hold - Long Sitting Isometric Ankle Eversion in Dorsiflexion with Ball at [place]  - 1 x daily - 7 x weekly - 3 sets - 10 reps - 5 hold - Sit to Stand  - 1 x daily - 7 x weekly - 1 sets - 10 reps - Seated Hip Abduction with Resistance  - 1 x daily - 7 x weekly - 1 sets - 20 reps - Seated Hamstring Curls with Resistance  - 1 x daily - 7 x weekly - 2 sets - 10 reps - Seated Long Arc Quad with Ankle Weight  - 1 x daily - 7 x weekly - 2 sets - 10 reps - Standing Hip Abduction with Counter Support  - 1 x daily - 7 x weekly - 2 sets - 10 reps - Supine Knee Extension Strengthening  - 1 x daily - 7 x weekly - 2 sets - 10 reps - Forward Step Down Touch with Toe  - 1 x daily - 7 x weekly - 1 sets - 10 reps - Seated Quad Set  - 1 x daily - 7 x weekly - 1 sets - 10 reps - 5 hold - Side Stepping with Resistance at Feet  - 1 x daily - 7 x weekly - 1 sets - 5 reps - Standing Gastroc Stretch at Counter  - 1 x daily - 7 x weekly - 1 sets - 2-3 reps - 30 hold - Standing Soleus Stretch on Foam 1/2 Roll  -  1 x daily - 7 x weekly - 1 sets - 2-3 reps - 30 hold  CLINICAL IMPRESSION: Patient was not able to get out of "kayak" independently.  She lacks the flexibility to get her legs under herself to come to stand but would be able to do this with assist of a friend easily.    Patient remains highly motivated with rehab.  She would benefit from continued skilled PT to meet final goals.      OBJECTIVE IMPAIRMENTS Abnormal gait, decreased activity tolerance, decreased balance, decreased endurance, decreased mobility, difficulty walking, decreased ROM, decreased strength, hypomobility, increased muscle spasms, impaired flexibility, improper body mechanics, postural dysfunction, and pain.    ACTIVITY LIMITATIONS standing, stairs, and locomotion level  PARTICIPATION LIMITATIONS: community activity  PERSONAL FACTORS Age and Time since onset of injury/illness/exacerbation are also affecting patient's functional outcome.   REHAB POTENTIAL: Good  CLINICAL DECISION MAKING: Evolving/moderate complexity  EVALUATION COMPLEXITY: Moderate   GOALS: Goals reviewed with patient? Yes  SHORT TERM GOALS: Target date: 12/24/2021  Pt will be independent with her initial HEP to improve LE strength and flexibility.  Baseline: Goal status: met 8/4  LONG TERM GOALS: Target date:03/16/22   Pt will have improved Rt gastroc strength, evident by her ability to complete 15 single leg heel raises proficiently.  Baseline:  Goal status: ongoing  2.  Pt will have improved hip strength to 4/5 MMT. Baseline:  Goal status: goal met 8/22   3.  Pt will have increase in ankle dorsiflexion to atleast 13 deg for greater ease descending steps  Baseline:  Goal status: revised   4.  Pt will be able to descend clinic steps with improved eccentric control/strength 4+/5 quads without increase in foot/knee pain and min use of UE support Baseline:  Goal status: revised   5.  Pt will report atleast 50% improvement in her Rt foot pain from the start of PT. Baseline:  Goal status: met 8/22  6. Improved squat and ankle balance/stability needed to return to kayaking in the future.   NEW    PLAN: PT FREQUENCY: 2x/week  PT DURATION: 6 weeks  PLANNED INTERVENTIONS: Therapeutic exercises, Therapeutic activity, Neuromuscular re-education, Balance training, Gait training, Patient/Family education, Joint mobilization, Stair training, Aquatic Therapy, Dry Needling, Cryotherapy, Taping, and Manual therapy  PLAN FOR NEXT SESSION:  emphasize foot rehab (in moderation);  Nu-step; leg press/calf press;  progress glute, quad and peroneal strength,  ankle DF ROM, STM as needed peroneals/gastrocs if needed;  possible DN   Meshell Abdulaziz B. Ranyah Groeneveld, PT 02/04/22 5:02 PM

## 2022-02-09 ENCOUNTER — Ambulatory Visit: Payer: PPO

## 2022-02-09 DIAGNOSIS — M79604 Pain in right leg: Secondary | ICD-10-CM | POA: Diagnosis not present

## 2022-02-09 DIAGNOSIS — M25671 Stiffness of right ankle, not elsewhere classified: Secondary | ICD-10-CM

## 2022-02-09 DIAGNOSIS — R262 Difficulty in walking, not elsewhere classified: Secondary | ICD-10-CM

## 2022-02-09 DIAGNOSIS — M79605 Pain in left leg: Secondary | ICD-10-CM

## 2022-02-09 DIAGNOSIS — M6281 Muscle weakness (generalized): Secondary | ICD-10-CM

## 2022-02-09 NOTE — Therapy (Signed)
OUTPATIENT PHYSICAL THERAPY LOWER EXTREMITY TREATMENT NOTE   Patient Name: Jamie Glover MRN: [id number] DOB:02/23/1956, 66 y.o., female Today's Date: 02/09/2022   PT End of Session - 02/09/22 1022     Visit Number 15    Date for PT Re-Evaluation 03/16/22    Authorization Type [employer]    Authorization Time Period 12/08/21 to 01/20/22 extended through 10/17    PT Start Time 1015    PT Stop Time 1100    PT Time Calculation (min) 45 min    Activity Tolerance Patient tolerated treatment well    Behavior During Therapy [facility] for tasks assessed/performed              Past Medical History:  Diagnosis Date   Arthritis    Hypertension    OSA (obstructive sleep apnea) 2013   Past Surgical History:  Procedure Laterality Date   BREAST BIOPSY     c-sections     CHOLECYSTECTOMY     CHOLECYSTECTOMY, LAPAROSCOPIC  1996   right knee surgery     Patient Active Problem List   Diagnosis Date Noted   Peroneal tendonitis, right 09/15/2021   Unilateral primary osteoarthritis, left knee 04/14/2020   Primary osteoarthritis of left knee 01/15/2019   Unilateral primary osteoarthritis, right knee 12/26/2017   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 ear 12/19/2017   Eustachian tube dysfunction, left 12/19/2017   Chronic serous otitis media of left ear 12/19/2017   Tinnitus 10/01/2011   Vitamin D deficiency 10/01/2011   GERD (gastroesophageal reflux disease) 10/01/2011   Neck pain 10/01/2011   HTN (hypertension) 10/01/2011   Sleep apnea 09/29/2011   BMI 33.0-33.9,adult 09/29/2011    PCP: Milagros Evener, MD  REFERRING PROVIDER: Faustino Congress, NP  REFERRING DIAG: Pain in Rt knee, Pain in Rt leg, Pain in Lt leg   THERAPY DIAG:  Pain in right leg  Pain in left leg  Stiffness of right ankle, not elsewhere classified  Muscle weakness (generalized)  Difficulty in walking, not elsewhere classified  Rationale for  Evaluation and Treatment Rehabilitation  ONSET DATE: 2 years ago  SUBJECTIVE:   SUBJECTIVE STATEMENT: Patient states she had a sudden onset of right lateral ankle pain ( points to the area of the peroneal tendon) this morning.  She recalls a sitting position in yoga that was uncomfortable to her ankle this past Friday but she didn't have any issues until this morning.    PERTINENT HISTORY:  Arthritis Rt knee, not much improvement with PT  Pt states that about 2 years ago, she was walking and felt a pop in her Rt foot. Some time went by and her foot was feeling a little better. She went on a trip and did a lot of walking which seemed to cause a flare-up in her foot pain. In January, she saw a podiatrist and was put in a boot for 3 weeks and had no improvement. She was sent to PT but was 1 of 4 pts at a time and felt that after 8 weeks she was not seeing much improvement. While at PT, she also noticed her Lt knee/outer leg was bothering her during her sessions. She has been on Mobic the last week and feels that her pain is better than usual because of this.   PAIN:  Are you having pain? Yes: NPRS scale: 1/10 Pain location: right knee and Rt lateral foot 5th toe to heel  Pain description: sore Aggravating factors:  walking, heel raises, standing on 1 leg Relieving factors: resting off her feet  PRECAUTIONS: None  WEIGHT BEARING RESTRICTIONS No  FALLS:  Has patient fallen in last 6 months? No  LIVING ENVIRONMENT: Lives with: lives with their family Lives in: House/apartment Stairs: Yes: External: 5 steps;   Has following equipment at home: None  OCCUPATION: retired Marine scientist   PLOF: Independent  PATIENT GOALS be able to walk without increase in Rt and Lt LE pain    OBJECTIVE:   DIAGNOSTIC FINDINGS: MRI Rt foot  PATIENT SURVEYS:  FOTO 55    8/22:  70% FOTO  COGNITION:  Overall cognitive status: Within functional limits for tasks assessed     SENSATION: Denies  numbness/tingling  EDEMA:  None noticed   POSTURE:  LE adduction, foot eversion  Right knee valgus  PALPATION: Tenderness over lateral aspect of the foot, at peroneal tendon insertion  LOWER EXTREMITY ROM:  Passive ROM Right eval Left eval 8/22  Hip flexion     Hip extension     Hip abduction     Hip adduction     Hip internal rotation     Hip external rotation     Knee flexion   125 bil   Knee extension     Ankle dorsiflexion 0 0 12 right  Ankle plantarflexion     Ankle inversion 25 (+pain)  30 28 right  Ankle eversion   38 right    (Blank rows = not tested)  LOWER EXTREMITY MMT:  MMT Right eval Left eval 8/4 8/22  Hip flexion      Hip extension 3/5 3/5 4 right/left 4+  Hip abduction 3/5 3/5 4 right/left 4+  Hip adduction      Hip internal rotation      Hip external rotation      Knee flexion 4/5 4/5 4+ right/left 4+  Knee extension 5/5 5/5 4 right/ 5 left 4 right   Ankle dorsiflexion 5/5     Ankle plantarflexion Unable to complete full range heel raise X10 reps     Ankle inversion 5/5     Ankle eversion 5/5      (Blank rows = not tested)  LOWER EXTREMITY SPECIAL TESTS:    FUNCTIONAL TESTS:  8/22 Single leg heel raises 10x proficient    GAIT: Distance walked:  Assistive device utilized:  Level of assistance:  Comments: Pt ambulates with decreased glute activation, (+) trendelenburg bilateral, hip adduction, foot eversion  Stairs: (+) trendelenburg, poor eccentric control bilaterally when descending steps    TODAY'S TREATMENT: 9/12: [employer]umbant bike x 5 min Level 1 (PT present to discuss progress) Seated toe and heel raises x 20 both LAQ with 5 lb ankle weights x 20 each LE March x 20 with 5 lbs both Seated hip ER (bottom of foot to opposite ankle with hips abducted) with 5 lbs both Up and over hurdle seated  2 x 10 each LE with 5 lbs Seated hamstring curl x 20 with red band Supine quad set x 20 Supine TKE with large green noodle x 20 with 5  lbs Supine SAQ x 20 wit 5 lbs both Supine ankle DF x 20 with red band Supine ankle eversion x 20 eccentric with red band  9/7: NuStep L3 (old model) x 5' PT present to discuss symptoms Lunge to BOSU x 10 fwd and then lateral each LE (modified to reduce shearing force on knees) Mini squats on balance pad 2 x 10 Step up and hold  fwd then lateral x 10 each LE on balance pad Simulated getting in /out of kayak to strategize safe method to do so.  Used 8 inches of step on each side and used hurdles to simulate front and back of opening of kayak.    8/31: NuStep L3 (old model) x 5' PT present to discuss symptoms 3 way dynamic wall stretch 10x each side  Cable pulley assisted retro lunge (1 side painful limited to 5 reps) Painful with RDL so modified with dead lift 5# 10x to ankle level 10x Box eccentric heel lifts and lowers 5x each direction Bil heel raise with small green soft ball between heels2  x10  Leaning on bar with red loop around knees hydrants 20x right/left Upside down BOSU left side PF/DF, inversion/eversion 10x bil UE support (very challenging) Leg press machine:  calf press 15# x10 on each side;  70# bil 15x leg press;  single leg press 35#  15x each side  Neuromusc re-ed: glute medius, quads, foot instrinsics, gastroc Therapeutic activity: getting in/out of kayak standing, walking  8/29: NuStep L3 (old model) x 5' PT present to discuss symptoms Runner's  toes stretch on wall 3x 30 sec  Ankle dynamic DF on chair 45 sec  Bil heel raise with small green soft ball between heels x10  Lateral step up onto BOSU 5x  Lateral step back to kneel on 2 blue cushions and using bar to assist back up (to simulate getting up off the floor) Floor slider 3 ways 10x on 1 side, 8x on other side Leg press machine:  calf press 15# 2x10 on each side;  65# bil 15x leg press;  single leg press 30#  15x each side  Yellow loop side stepping 10x Neuromusc re-ed: glute medius, quads, foot instrinsics,  gastroc Therapeutic activity: getting in/out of kayak standing, walking   PATIENT EDUCATION:  Education details: eval findings/POC; HEP Person educated: Patient Education method: Explanation Education comprehension: returned demonstration   HOME EXERCISE PROGRAM: Access Code: [email or web address] URL: [email or web address] Date: 01/19/2022 Prepared by: Ruben Im  Exercises - Supine Bridge with Resistance Band  - 1 x daily - 7 x weekly - 2-3 sets - 10 reps - Sidelying Hip Abduction  - 1 x daily - 7 x weekly - 1-2 sets - 10 reps - Supine Piriformis Stretch with Foot on Ground  - 1 x daily - 7 x weekly - 2 sets - 30 sec hold - Standing Knee Flexion Stretch on Step  - 1 x daily - 7 x weekly - 1 sets - 10 reps - Standing Isometric Hip Abduction with Ball on Wall  - 1 x daily - 7 x weekly - 1 sets - 5 reps - 5 hold - Long Sitting Isometric Ankle Inversion in Dorsiflexion with Ball at [place]  - 1 x daily - 7 x weekly - 1 sets - 10 reps - 5 hold - Long Sitting Isometric Ankle Eversion in Dorsiflexion with Ball at [place]  - 1 x daily - 7 x weekly - 3 sets - 10 reps - 5 hold - Sit to Stand  - 1 x daily - 7 x weekly - 1 sets - 10 reps - Seated Hip Abduction with Resistance  - 1 x daily - 7 x weekly - 1 sets - 20 reps - Seated Hamstring Curls with Resistance  - 1 x daily - 7 x weekly - 2 sets - 10 reps - Seated Long Arc Quad with Ankle Weight  - 1  x daily - 7 x weekly - 2 sets - 10 reps - Standing Hip Abduction with Counter Support  - 1 x daily - 7 x weekly - 2 sets - 10 reps - Supine Knee Extension Strengthening  - 1 x daily - 7 x weekly - 2 sets - 10 reps - Forward Step Down Touch with Toe  - 1 x daily - 7 x weekly - 1 sets - 10 reps - Seated Quad Set  - 1 x daily - 7 x weekly - 1 sets - 10 reps - 5 hold - Side Stepping with Resistance at Feet  - 1 x daily - 7 x weekly - 1 sets - 5 reps - Standing Gastroc Stretch at Counter  - 1 x daily - 7 x weekly - 1 sets - 2-3 reps - 30 hold - Standing  Soleus Stretch on Foam 1/2 Roll  - 1 x daily - 7 x weekly - 1 sets - 2-3 reps - 30 hold  CLINICAL IMPRESSION: Patient had some symptoms consistent with peroneal tendinitis today.  We modified session to avoid exacerbating this.  She was able to complete all tasks with minor fatigue.  She had no pain with eccentric eversion or DF.  She would benefit from continued skilled PT to meet final goals.      OBJECTIVE IMPAIRMENTS Abnormal gait, decreased activity tolerance, decreased balance, decreased endurance, decreased mobility, difficulty walking, decreased ROM, decreased strength, hypomobility, increased muscle spasms, impaired flexibility, improper body mechanics, postural dysfunction, and pain.   ACTIVITY LIMITATIONS standing, stairs, and locomotion level  PARTICIPATION LIMITATIONS: community activity  PERSONAL FACTORS Age and Time since onset of injury/illness/exacerbation are also affecting patient's functional outcome.   REHAB POTENTIAL: Good  CLINICAL DECISION MAKING: Evolving/moderate complexity  EVALUATION COMPLEXITY: Moderate   GOALS: Goals reviewed with patient? Yes  SHORT TERM GOALS: Target date: 12/24/2021  Pt will be independent with her initial HEP to improve LE strength and flexibility.  Baseline: Goal status: met 8/4  LONG TERM GOALS: Target date:03/16/22   Pt will have improved Rt gastroc strength, evident by her ability to complete 15 single leg heel raises proficiently.  Baseline:  Goal status: ongoing  2.  Pt will have improved hip strength to 4/5 MMT. Baseline:  Goal status: goal met 8/22   3.  Pt will have increase in ankle dorsiflexion to atleast 13 deg for greater ease descending steps  Baseline:  Goal status: revised   4.  Pt will be able to descend clinic steps with improved eccentric control/strength 4+/5 quads without increase in foot/knee pain and min use of UE support Baseline:  Goal status: revised   5.  Pt will report atleast 50% improvement  in her Rt foot pain from the start of PT. Baseline:  Goal status: met 8/22  6. Improved squat and ankle balance/stability needed to return to kayaking in the future.   NEW    PLAN: PT FREQUENCY: 2x/week  PT DURATION: 6 weeks  PLANNED INTERVENTIONS: Therapeutic exercises, Therapeutic activity, Neuromuscular re-education, Balance training, Gait training, Patient/Family education, Joint mobilization, Stair training, Aquatic Therapy, Dry Needling, Cryotherapy, Taping, and Manual therapy  PLAN FOR NEXT SESSION:  emphasize foot rehab (in moderation);  Nu-step; leg press/calf press;  progress glute, quad and peroneal strength,  ankle DF ROM, STM as needed peroneals/gastrocs if needed; possible DN   Nonna Renninger B. Rashon Westrup, PT 02/09/22 11:07 AM  [facility] [address] [place], [place] [address] Phone # [phone] Fax  [phone]       

## 2022-02-11 ENCOUNTER — Ambulatory Visit: Payer: PPO

## 2022-02-11 DIAGNOSIS — M79605 Pain in left leg: Secondary | ICD-10-CM

## 2022-02-11 DIAGNOSIS — R262 Difficulty in walking, not elsewhere classified: Secondary | ICD-10-CM

## 2022-02-11 DIAGNOSIS — M25671 Stiffness of right ankle, not elsewhere classified: Secondary | ICD-10-CM

## 2022-02-11 DIAGNOSIS — M79604 Pain in right leg: Secondary | ICD-10-CM

## 2022-02-11 DIAGNOSIS — M6281 Muscle weakness (generalized): Secondary | ICD-10-CM

## 2022-02-11 NOTE — Therapy (Signed)
OUTPATIENT PHYSICAL THERAPY LOWER EXTREMITY TREATMENT NOTE   Patient Name: Jamie Glover MRN: [id number] DOB:April 13, 1956, 66 y.o., female Today's Date: 02/11/2022   PT End of Session - 02/11/22 1406     Visit Number 16    Date for PT Re-Evaluation 03/16/22    Authorization Type [employer]    Authorization Time Period 12/08/21 to 01/20/22 extended through 10/17    PT Start Time 1404    PT Stop Time 1445    PT Time Calculation (min) 41 min    Activity Tolerance Patient tolerated treatment well    Behavior During Therapy [facility] for tasks assessed/performed              Past Medical History:  Diagnosis Date   Arthritis    Hypertension    OSA (obstructive sleep apnea) 2013   Past Surgical History:  Procedure Laterality Date   BREAST BIOPSY     c-sections     CHOLECYSTECTOMY     CHOLECYSTECTOMY, LAPAROSCOPIC  1996   right knee surgery     Patient Active Problem List   Diagnosis Date Noted   Peroneal tendonitis, right 09/15/2021   Unilateral primary osteoarthritis, left knee 04/14/2020   Primary osteoarthritis of left knee 01/15/2019   Unilateral primary osteoarthritis, right knee 12/26/2017   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 ear 12/19/2017   Eustachian tube dysfunction, left 12/19/2017   Chronic serous otitis media of left ear 12/19/2017   Tinnitus 10/01/2011   Vitamin D deficiency 10/01/2011   GERD (gastroesophageal reflux disease) 10/01/2011   Neck pain 10/01/2011   HTN (hypertension) 10/01/2011   Sleep apnea 09/29/2011   BMI 33.0-33.9,adult 09/29/2011    PCP: Milagros Evener, MD  REFERRING PROVIDER: Faustino Congress, NP  REFERRING DIAG: Pain in Rt knee, Pain in Rt leg, Pain in Lt leg   THERAPY DIAG:  Pain in right leg  Pain in left leg  Stiffness of right ankle, not elsewhere classified  Muscle weakness (generalized)  Difficulty in walking, not elsewhere classified  Rationale for  Evaluation and Treatment Rehabilitation  ONSET DATE: 2 years ago  SUBJECTIVE:   SUBJECTIVE STATEMENT: Patient states the right lateral ankle pain has subsided.   PERTINENT HISTORY:  Arthritis Rt knee, not much improvement with PT  Pt states that about 2 years ago, she was walking and felt a pop in her Rt foot. Some time went by and her foot was feeling a little better. She went on a trip and did a lot of walking which seemed to cause a flare-up in her foot pain. In January, she saw a podiatrist and was put in a boot for 3 weeks and had no improvement. She was sent to PT but was 1 of 4 pts at a time and felt that after 8 weeks she was not seeing much improvement. While at PT, she also noticed her Lt knee/outer leg was bothering her during her sessions. She has been on Mobic the last week and feels that her pain is better than usual because of this.   PAIN:  Are you having pain? Yes: NPRS scale: 1/10 Pain location: right knee and Rt lateral foot 5th toe to heel  Pain description: sore Aggravating factors: walking, heel raises, standing on 1 leg Relieving factors: resting off her feet  PRECAUTIONS: None  WEIGHT BEARING RESTRICTIONS No  FALLS:  Has patient fallen in last 6 months? No  LIVING ENVIRONMENT: Lives with: lives with their family  Lives in: House/apartment Stairs: Yes: External: 5 steps;   Has following equipment at home: None  OCCUPATION: retired Marine scientist   PLOF: Independent  PATIENT GOALS be able to walk without increase in Rt and Lt LE pain    OBJECTIVE:   DIAGNOSTIC FINDINGS: MRI Rt foot  PATIENT SURVEYS:  FOTO 55    8/22:  70% FOTO  COGNITION:  Overall cognitive status: Within functional limits for tasks assessed     SENSATION: Denies numbness/tingling  EDEMA:  None noticed   POSTURE:  LE adduction, foot eversion  Right knee valgus  PALPATION: Tenderness over lateral aspect of the foot, at peroneal tendon insertion  LOWER EXTREMITY  ROM:  Passive ROM Right eval Left eval 8/22  Hip flexion     Hip extension     Hip abduction     Hip adduction     Hip internal rotation     Hip external rotation     Knee flexion   125 bil   Knee extension     Ankle dorsiflexion 0 0 12 right  Ankle plantarflexion     Ankle inversion 25 (+pain)  30 28 right  Ankle eversion   38 right    (Blank rows = not tested)  LOWER EXTREMITY MMT:  MMT Right eval Left eval 8/4 8/22  Hip flexion      Hip extension 3/5 3/5 4 right/left 4+  Hip abduction 3/5 3/5 4 right/left 4+  Hip adduction      Hip internal rotation      Hip external rotation      Knee flexion 4/5 4/5 4+ right/left 4+  Knee extension 5/5 5/5 4 right/ 5 left 4 right   Ankle dorsiflexion 5/5     Ankle plantarflexion Unable to complete full range heel raise X10 reps     Ankle inversion 5/5     Ankle eversion 5/5      (Blank rows = not tested)  LOWER EXTREMITY SPECIAL TESTS:    FUNCTIONAL TESTS:  8/22 Single leg heel raises 10x proficient    GAIT: Distance walked:  Assistive device utilized:  Level of assistance:  Comments: Pt ambulates with decreased glute activation, (+) trendelenburg bilateral, hip adduction, foot eversion  Stairs: (+) trendelenburg, poor eccentric control bilaterally when descending steps    TODAY'S TREATMENT: 9/14: Recumbant bike x 5 min Level 1 (PT present to discuss progress) Seated toe and heel raises x 20 both LAQ with 5 lb ankle weights x 20 each LE March x 20 with 5 lbs both Seated hip ER (bottom of foot to opposite ankle with hips abducted) with 5 lbs both Up and over hurdle seated 10 each LE with 5 lbs Seated hamstring curl x 20 with green band Supine quad set x 20 Supine TKE with large green noodle x 20 with 5 lbs Supine SAQ x 20 wit 5 lbs both Supine SLR 2 x 10 each LE both  Hooklying clam x 20 with yellow loop Sidelying clam 2 x 10 yellow loop both sides Supine ankle DF x 20 with red band right  Supine ankle  eversion x 20 eccentric with red band right  9/12: Recumbant bike x 5 min Level 1 (PT present to discuss progress) Seated toe and heel raises x 20 both LAQ with 5 lb ankle weights x 20 each LE March x 20 with 5 lbs both Seated hip ER (bottom of foot to opposite ankle with hips abducted) with 5 lbs both Up and over hurdle  seated  2 x 10 each LE with 5 lbs Seated hamstring curl x 20 with red band Supine quad set x 20 Supine TKE with large green noodle x 20 with 5 lbs Supine SAQ x 20 wit 5 lbs both Supine ankle DF x 20 with red band Supine ankle eversion x 20 eccentric with red band  9/7: NuStep L3 (old model) x 5' PT present to discuss symptoms Lunge to BOSU x 10 fwd and then lateral each LE (modified to reduce shearing force on knees) Mini squats on balance pad 2 x 10 Step up and hold fwd then lateral x 10 each LE on balance pad Simulated getting in /out of kayak to strategize safe method to do so.  Used 8 inches of step on each side and used hurdles to simulate front and back of opening of kayak.    PATIENT EDUCATION:  Education details: eval findings/POC; HEP Person educated: Patient Education method: Explanation Education comprehension: returned demonstration   HOME EXERCISE PROGRAM: Access Code: [email or web address] URL: [email or web address] Date: 01/19/2022 Prepared by: Ruben Im  Exercises - Supine Bridge with Resistance Band  - 1 x daily - 7 x weekly - 2-3 sets - 10 reps - Sidelying Hip Abduction  - 1 x daily - 7 x weekly - 1-2 sets - 10 reps - Supine Piriformis Stretch with Foot on Ground  - 1 x daily - 7 x weekly - 2 sets - 30 sec hold - Standing Knee Flexion Stretch on Step  - 1 x daily - 7 x weekly - 1 sets - 10 reps - Standing Isometric Hip Abduction with Ball on Wall  - 1 x daily - 7 x weekly - 1 sets - 5 reps - 5 hold - Long Sitting Isometric Ankle Inversion in Dorsiflexion with Ball at [place]  - 1 x daily - 7 x weekly - 1 sets - 10 reps - 5 hold - Long  Sitting Isometric Ankle Eversion in Dorsiflexion with Ball at [place]  - 1 x daily - 7 x weekly - 3 sets - 10 reps - 5 hold - Sit to Stand  - 1 x daily - 7 x weekly - 1 sets - 10 reps - Seated Hip Abduction with Resistance  - 1 x daily - 7 x weekly - 1 sets - 20 reps - Seated Hamstring Curls with Resistance  - 1 x daily - 7 x weekly - 2 sets - 10 reps - Seated Long Arc Quad with Ankle Weight  - 1 x daily - 7 x weekly - 2 sets - 10 reps - Standing Hip Abduction with Counter Support  - 1 x daily - 7 x weekly - 2 sets - 10 reps - Supine Knee Extension Strengthening  - 1 x daily - 7 x weekly - 2 sets - 10 reps - Forward Step Down Touch with Toe  - 1 x daily - 7 x weekly - 1 sets - 10 reps - Seated Quad Set  - 1 x daily - 7 x weekly - 1 sets - 10 reps - 5 hold - Side Stepping with Resistance at Feet  - 1 x daily - 7 x weekly - 1 sets - 5 reps - Standing Gastroc Stretch at Counter  - 1 x daily - 7 x weekly - 1 sets - 2-3 reps - 30 hold - Standing Soleus Stretch on Foam 1/2 Roll  - 1 x daily - 7 x weekly - 1 sets - 2-3 reps - 30  hold  CLINICAL IMPRESSION: Eulah is progressing appropriately.  She has no signs or symptoms of the peroneal tendinitis today.  She still has some proximal weakness and that needs to be addressed.  Knee pain has been minimal.  She would benefit from continued skilled PT to meet final goals.      OBJECTIVE IMPAIRMENTS Abnormal gait, decreased activity tolerance, decreased balance, decreased endurance, decreased mobility, difficulty walking, decreased ROM, decreased strength, hypomobility, increased muscle spasms, impaired flexibility, improper body mechanics, postural dysfunction, and pain.   ACTIVITY LIMITATIONS standing, stairs, and locomotion level  PARTICIPATION LIMITATIONS: community activity  PERSONAL FACTORS Age and Time since onset of injury/illness/exacerbation are also affecting patient's functional outcome.   REHAB POTENTIAL: Good  CLINICAL DECISION MAKING:  Evolving/moderate complexity  EVALUATION COMPLEXITY: Moderate   GOALS: Goals reviewed with patient? Yes  SHORT TERM GOALS: Target date: 12/24/2021  Pt will be independent with her initial HEP to improve LE strength and flexibility.  Baseline: Goal status: met 8/4  LONG TERM GOALS: Target date:03/16/22   Pt will have improved Rt gastroc strength, evident by her ability to complete 15 single leg heel raises proficiently.  Baseline:  Goal status: ongoing  2.  Pt will have improved hip strength to 4/5 MMT. Baseline:  Goal status: goal met 8/22   3.  Pt will have increase in ankle dorsiflexion to atleast 13 deg for greater ease descending steps  Baseline:  Goal status: revised   4.  Pt will be able to descend clinic steps with improved eccentric control/strength 4+/5 quads without increase in foot/knee pain and min use of UE support Baseline:  Goal status: revised   5.  Pt will report atleast 50% improvement in her Rt foot pain from the start of PT. Baseline:  Goal status: met 8/22  6. Improved squat and ankle balance/stability needed to return to kayaking in the future.   NEW    PLAN: PT FREQUENCY: 2x/week  PT DURATION: 6 weeks  PLANNED INTERVENTIONS: Therapeutic exercises, Therapeutic activity, Neuromuscular re-education, Balance training, Gait training, Patient/Family education, Joint mobilization, Stair training, Aquatic Therapy, Dry Needling, Cryotherapy, Taping, and Manual therapy  PLAN FOR NEXT SESSION:  continue proximal strengthening including hip strengthening. Nu-step; leg press/calf press;  progress glute, quad and peroneal strength,  ankle DF ROM, STM as needed peroneals/gastrocs if needed; possible DN   Kyah Buesing B. Bralon Antkowiak, PT 02/11/22 2:39 PM  [facility] [address] [place], [place] [address] Phone # [phone] Fax [phone]

## 2022-02-16 ENCOUNTER — Ambulatory Visit: Payer: PPO

## 2022-02-16 DIAGNOSIS — M79604 Pain in right leg: Secondary | ICD-10-CM

## 2022-02-16 DIAGNOSIS — R262 Difficulty in walking, not elsewhere classified: Secondary | ICD-10-CM

## 2022-02-16 DIAGNOSIS — M25671 Stiffness of right ankle, not elsewhere classified: Secondary | ICD-10-CM

## 2022-02-16 DIAGNOSIS — M79605 Pain in left leg: Secondary | ICD-10-CM

## 2022-02-16 DIAGNOSIS — M6281 Muscle weakness (generalized): Secondary | ICD-10-CM

## 2022-02-16 NOTE — Therapy (Signed)
OUTPATIENT PHYSICAL THERAPY LOWER EXTREMITY TREATMENT NOTE   Patient Name: KHARLIE BRING MRN: [id number] DOB:1955/11/09, 66 y.o., female Today's Date: 02/16/2022   PT End of Session - 02/16/22 1745     Visit Number 17    Date for PT Re-Evaluation 03/16/22    Authorization Type [employer]    Authorization Time Period 12/08/21 to 01/20/22 extended through 10/17    PT Start Time 1100    PT Stop Time 1145    PT Time Calculation (min) 45 min    Activity Tolerance Patient tolerated treatment well    Behavior During Therapy [facility] for tasks assessed/performed               Past Medical History:  Diagnosis Date   Arthritis    Hypertension    OSA (obstructive sleep apnea) 2013   Past Surgical History:  Procedure Laterality Date   BREAST BIOPSY     c-sections     CHOLECYSTECTOMY     CHOLECYSTECTOMY, LAPAROSCOPIC  1996   right knee surgery     Patient Active Problem List   Diagnosis Date Noted   Peroneal tendonitis, right 09/15/2021   Unilateral primary osteoarthritis, left knee 04/14/2020   Primary osteoarthritis of left knee 01/15/2019   Unilateral primary osteoarthritis, right knee 12/26/2017   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 ear 12/19/2017   Eustachian tube dysfunction, left 12/19/2017   Chronic serous otitis media of left ear 12/19/2017   Tinnitus 10/01/2011   Vitamin D deficiency 10/01/2011   GERD (gastroesophageal reflux disease) 10/01/2011   Neck pain 10/01/2011   HTN (hypertension) 10/01/2011   Sleep apnea 09/29/2011   BMI 33.0-33.9,adult 09/29/2011    PCP: Milagros Evener, MD  REFERRING PROVIDER: Faustino Congress, NP  REFERRING DIAG: Pain in Rt knee, Pain in Rt leg, Pain in Lt leg   THERAPY DIAG:  Pain in right leg  Pain in left leg  Stiffness of right ankle, not elsewhere classified  Muscle weakness (generalized)  Difficulty in walking, not elsewhere classified  Rationale  for Evaluation and Treatment Rehabilitation  ONSET DATE: 2 years ago  SUBJECTIVE:   SUBJECTIVE STATEMENT: Patient states she is doing well and was able to do several flights of steps for exercise over the weekend.   PERTINENT HISTORY:  Arthritis Rt knee, not much improvement with PT  Pt states that about 2 years ago, she was walking and felt a pop in her Rt foot. Some time went by and her foot was feeling a little better. She went on a trip and did a lot of walking which seemed to cause a flare-up in her foot pain. In January, she saw a podiatrist and was put in a boot for 3 weeks and had no improvement. She was sent to PT but was 1 of 4 pts at a time and felt that after 8 weeks she was not seeing much improvement. While at PT, she also noticed her Lt knee/outer leg was bothering her during her sessions. She has been on Mobic the last week and feels that her pain is better than usual because of this.   PAIN:  Are you having pain? Yes: NPRS scale: 1/10 Pain location: right knee and Rt lateral foot 5th toe to heel  Pain description: sore Aggravating factors: walking, heel raises, standing on 1 leg Relieving factors: resting off her feet  PRECAUTIONS: None  WEIGHT BEARING RESTRICTIONS No  FALLS:  Has patient fallen in last  6 months? No  LIVING ENVIRONMENT: Lives with: lives with their family Lives in: House/apartment Stairs: Yes: External: 5 steps;   Has following equipment at home: None  OCCUPATION: retired Marine scientist   PLOF: Independent  PATIENT GOALS be able to walk without increase in Rt and Lt LE pain    OBJECTIVE:   DIAGNOSTIC FINDINGS: MRI Rt foot  PATIENT SURVEYS:  FOTO 55    8/22:  70% FOTO  COGNITION:  Overall cognitive status: Within functional limits for tasks assessed     SENSATION: Denies numbness/tingling  EDEMA:  None noticed   POSTURE:  LE adduction, foot eversion  Right knee valgus  PALPATION: Tenderness over lateral aspect of the foot, at  peroneal tendon insertion  LOWER EXTREMITY ROM:  Passive ROM Right eval Left eval 8/22  Hip flexion     Hip extension     Hip abduction     Hip adduction     Hip internal rotation     Hip external rotation     Knee flexion   125 bil   Knee extension     Ankle dorsiflexion 0 0 12 right  Ankle plantarflexion     Ankle inversion 25 (+pain)  30 28 right  Ankle eversion   38 right    (Blank rows = not tested)  LOWER EXTREMITY MMT:  MMT Right eval Left eval 8/4 8/22  Hip flexion      Hip extension 3/5 3/5 4 right/left 4+  Hip abduction 3/5 3/5 4 right/left 4+  Hip adduction      Hip internal rotation      Hip external rotation      Knee flexion 4/5 4/5 4+ right/left 4+  Knee extension 5/5 5/5 4 right/ 5 left 4 right   Ankle dorsiflexion 5/5     Ankle plantarflexion Unable to complete full range heel raise X10 reps     Ankle inversion 5/5     Ankle eversion 5/5      (Blank rows = not tested)  LOWER EXTREMITY SPECIAL TESTS:    FUNCTIONAL TESTS:  8/22 Single leg heel raises 10x proficient    GAIT: Distance walked:  Assistive device utilized:  Level of assistance:  Comments: Pt ambulates with decreased glute activation, (+) trendelenburg bilateral, hip adduction, foot eversion  Stairs: (+) trendelenburg, poor eccentric control bilaterally when descending steps    TODAY'S TREATMENT: 9/19: Recumbant bike x 5 min Level 1 (PT present to discuss progress) Seated toe and heel raises x 20 both Seated right ankle inv/ev on half roll LAQ with 6 lb ankle weights x 20 each LE March x 20 with 6 lbs both Seated hip ER (bottom of foot to opposite ankle with hips abducted) with 6 lbs both Up and over hurdle seated 10 each LE with 6 lbs SLS cone touches 3 x 10 each LE (cone on counter top) Step up (bottom step of stairs) fwd without UE support x 10 each Side step up with eccentric lowering Sit to stand x 10 (chair) Squat to chair (with balance pad in chair) x  10  9/14: Recumbant bike x 5 min Level 1 (PT present to discuss progress) Seated toe and heel raises x 20 both LAQ with 5 lb ankle weights x 20 each LE March x 20 with 5 lbs both Seated hip ER (bottom of foot to opposite ankle with hips abducted) with 5 lbs both Up and over hurdle seated 10 each LE with 5 lbs Seated hamstring curl x  20 with green band Supine quad set x 20 Supine TKE with large green noodle x 20 with 5 lbs Supine SAQ x 20 wit 5 lbs both Supine SLR 2 x 10 each LE both  Hooklying clam x 20 with yellow loop Sidelying clam 2 x 10 yellow loop both sides Supine ankle DF x 20 with red band right  Supine ankle eversion x 20 eccentric with red band right  9/12: Recumbant bike x 5 min Level 1 (PT present to discuss progress) Seated toe and heel raises x 20 both LAQ with 5 lb ankle weights x 20 each LE March x 20 with 5 lbs both Seated hip ER (bottom of foot to opposite ankle with hips abducted) with 5 lbs both Up and over hurdle seated  2 x 10 each LE with 5 lbs Seated hamstring curl x 20 with red band Supine quad set x 20 Supine TKE with large green noodle x 20 with 5 lbs Supine SAQ x 20 wit 5 lbs both Supine ankle DF x 20 with red band Supine ankle eversion x 20 eccentric with red band   PATIENT EDUCATION:  Education details: eval findings/POC; HEP Person educated: Patient Education method: Explanation Education comprehension: returned demonstration   HOME EXERCISE PROGRAM: Access Code: [email or web address] URL: [email or web address] Date: 01/19/2022 Prepared by: Ruben Im  Exercises - Supine Bridge with Resistance Band  - 1 x daily - 7 x weekly - 2-3 sets - 10 reps - Sidelying Hip Abduction  - 1 x daily - 7 x weekly - 1-2 sets - 10 reps - Supine Piriformis Stretch with Foot on Ground  - 1 x daily - 7 x weekly - 2 sets - 30 sec hold - Standing Knee Flexion Stretch on Step  - 1 x daily - 7 x weekly - 1 sets - 10 reps - Standing Isometric Hip Abduction  with Ball on Wall  - 1 x daily - 7 x weekly - 1 sets - 5 reps - 5 hold - Long Sitting Isometric Ankle Inversion in Dorsiflexion with Ball at [place]  - 1 x daily - 7 x weekly - 1 sets - 10 reps - 5 hold - Long Sitting Isometric Ankle Eversion in Dorsiflexion with Ball at [place]  - 1 x daily - 7 x weekly - 3 sets - 10 reps - 5 hold - Sit to Stand  - 1 x daily - 7 x weekly - 1 sets - 10 reps - Seated Hip Abduction with Resistance  - 1 x daily - 7 x weekly - 1 sets - 20 reps - Seated Hamstring Curls with Resistance  - 1 x daily - 7 x weekly - 2 sets - 10 reps - Seated Long Arc Quad with Ankle Weight  - 1 x daily - 7 x weekly - 2 sets - 10 reps - Standing Hip Abduction with Counter Support  - 1 x daily - 7 x weekly - 2 sets - 10 reps - Supine Knee Extension Strengthening  - 1 x daily - 7 x weekly - 2 sets - 10 reps - Forward Step Down Touch with Toe  - 1 x daily - 7 x weekly - 1 sets - 10 reps - Seated Quad Set  - 1 x daily - 7 x weekly - 1 sets - 10 reps - 5 hold - Side Stepping with Resistance at Feet  - 1 x daily - 7 x weekly - 1 sets - 5 reps - Standing Gastroc Stretch at  Counter  - 1 x daily - 7 x weekly - 1 sets - 2-3 reps - 30 hold - Standing Soleus Stretch on Foam 1/2 Roll  - 1 x daily - 7 x weekly - 1 sets - 2-3 reps - 30 hold  CLINICAL IMPRESSION: Kjirsten is progressing appropriately.   Knee pain has been minimal.  She would benefit from continued skilled PT to meet final goals.      OBJECTIVE IMPAIRMENTS Abnormal gait, decreased activity tolerance, decreased balance, decreased endurance, decreased mobility, difficulty walking, decreased ROM, decreased strength, hypomobility, increased muscle spasms, impaired flexibility, improper body mechanics, postural dysfunction, and pain.   ACTIVITY LIMITATIONS standing, stairs, and locomotion level  PARTICIPATION LIMITATIONS: community activity  PERSONAL FACTORS Age and Time since onset of injury/illness/exacerbation are also affecting patient's  functional outcome.   REHAB POTENTIAL: Good  CLINICAL DECISION MAKING: Evolving/moderate complexity  EVALUATION COMPLEXITY: Moderate   GOALS: Goals reviewed with patient? Yes  SHORT TERM GOALS: Target date: 12/24/2021  Pt will be independent with her initial HEP to improve LE strength and flexibility.  Baseline: Goal status: met 8/4  LONG TERM GOALS: Target date:03/16/22   Pt will have improved Rt gastroc strength, evident by her ability to complete 15 single leg heel raises proficiently.  Baseline:  Goal status: ongoing  2.  Pt will have improved hip strength to 4/5 MMT. Baseline:  Goal status: goal met 8/22   3.  Pt will have increase in ankle dorsiflexion to atleast 13 deg for greater ease descending steps  Baseline:  Goal status: revised   4.  Pt will be able to descend clinic steps with improved eccentric control/strength 4+/5 quads without increase in foot/knee pain and min use of UE support Baseline:  Goal status: revised   5.  Pt will report atleast 50% improvement in her Rt foot pain from the start of PT. Baseline:  Goal status: met 8/22  6. Improved squat and ankle balance/stability needed to return to kayaking in the future.   NEW    PLAN: PT FREQUENCY: 2x/week  PT DURATION: 6 weeks  PLANNED INTERVENTIONS: Therapeutic exercises, Therapeutic activity, Neuromuscular re-education, Balance training, Gait training, Patient/Family education, Joint mobilization, Stair training, Aquatic Therapy, Dry Needling, Cryotherapy, Taping, and Manual therapy  PLAN FOR NEXT SESSION:  continue proximal strengthening including hip strengthening. Nu-step; leg press/calf press;  progress glute, quad and peroneal strength,  ankle DF ROM, STM as needed peroneals/gastrocs if needed; possible DN   Saraih Lorton B. Brin Ruggerio, PT 02/16/22 5:48 PM  [facility] [address], [place], [place] [address] Phone # [phone] Fax [phone]

## 2022-02-18 ENCOUNTER — Ambulatory Visit: Payer: PPO

## 2022-02-18 DIAGNOSIS — M25671 Stiffness of right ankle, not elsewhere classified: Secondary | ICD-10-CM

## 2022-02-18 DIAGNOSIS — M79604 Pain in right leg: Secondary | ICD-10-CM

## 2022-02-18 DIAGNOSIS — R262 Difficulty in walking, not elsewhere classified: Secondary | ICD-10-CM

## 2022-02-18 DIAGNOSIS — M79605 Pain in left leg: Secondary | ICD-10-CM

## 2022-02-18 DIAGNOSIS — M6281 Muscle weakness (generalized): Secondary | ICD-10-CM

## 2022-02-18 NOTE — Therapy (Signed)
OUTPATIENT PHYSICAL THERAPY LOWER EXTREMITY TREATMENT NOTE   Patient Name: Jamie Glover MRN: [id number] DOB:[birth year], 66 y.o., female Today's Date: 02/18/2022   PT End of Session - 02/18/22 1027     Visit Number 18    Date for PT Re-Evaluation 03/16/22    Authorization Type [employer]    Authorization Time Period 12/08/21 to 01/20/22 extended through 10/17    PT Start Time 1015    PT Stop Time 1100    PT Time Calculation (min) 45 min    Activity Tolerance Patient tolerated treatment well    Behavior During Therapy [facility] for tasks assessed/performed               Past Medical History:  Diagnosis Date   Arthritis    Hypertension    OSA (obstructive sleep apnea) 2013   Past Surgical History:  Procedure Laterality Date   BREAST BIOPSY     c-sections     CHOLECYSTECTOMY     CHOLECYSTECTOMY, LAPAROSCOPIC  1996   right knee surgery     Patient Active Problem List   Diagnosis Date Noted   Peroneal tendonitis, right 09/15/2021   Unilateral primary osteoarthritis, left knee 04/14/2020   Primary osteoarthritis of left knee 01/15/2019   Unilateral primary osteoarthritis, right knee 12/26/2017   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 ear 12/19/2017   Eustachian tube dysfunction, left 12/19/2017   Chronic serous otitis media of left ear 12/19/2017   Tinnitus 10/01/2011   Vitamin D deficiency 10/01/2011   GERD (gastroesophageal reflux disease) 10/01/2011   Neck pain 10/01/2011   HTN (hypertension) 10/01/2011   Sleep apnea 09/29/2011   BMI 33.0-33.9,adult 09/29/2011    PCP: Milagros Evener, MD  REFERRING PROVIDER: Faustino Congress, NP  REFERRING DIAG: Pain in Rt knee, Pain in Rt leg, Pain in Lt leg   THERAPY DIAG:  Pain in right leg  Pain in left leg  Stiffness of right ankle, not elsewhere classified  Muscle weakness (generalized)  Difficulty in walking, not elsewhere classified  Rationale  for Evaluation and Treatment Rehabilitation  ONSET DATE: 2 years ago  SUBJECTIVE:   SUBJECTIVE STATEMENT: Patient states she had some increased ankle soreness after last visit.    PERTINENT HISTORY:  Arthritis Rt knee, not much improvement with PT  Pt states that about 2 years ago, she was walking and felt a pop in her Rt foot. Some time went by and her foot was feeling a little better. She went on a trip and did a lot of walking which seemed to cause a flare-up in her foot pain. In January, she saw a podiatrist and was put in a boot for 3 weeks and had no improvement. She was sent to PT but was 1 of 4 pts at a time and felt that after 8 weeks she was not seeing much improvement. While at PT, she also noticed her Lt knee/outer leg was bothering her during her sessions. She has been on Mobic the last week and feels that her pain is better than usual because of this.   PAIN:  Are you having pain? Yes: NPRS scale: 1/10 Pain location: right knee and Rt lateral foot 5th toe to heel  Pain description: sore Aggravating factors: walking, heel raises, standing on 1 leg Relieving factors: resting off her feet  PRECAUTIONS: None  WEIGHT BEARING RESTRICTIONS No  FALLS:  Has patient fallen in last 6 months? No  LIVING ENVIRONMENT: Lives with:  lives with their family Lives in: House/apartment Stairs: Yes: External: 5 steps;   Has following equipment at home: None  OCCUPATION: retired Marine scientist   PLOF: Independent  PATIENT GOALS be able to walk without increase in Rt and Lt LE pain    OBJECTIVE:   DIAGNOSTIC FINDINGS: MRI Rt foot  PATIENT SURVEYS:  FOTO 55    8/22:  70% FOTO  COGNITION:  Overall cognitive status: Within functional limits for tasks assessed     SENSATION: Denies numbness/tingling  EDEMA:  None noticed   POSTURE:  LE adduction, foot eversion  Right knee valgus  PALPATION: Tenderness over lateral aspect of the foot, at peroneal tendon insertion  LOWER  EXTREMITY ROM:  Passive ROM Right eval Left eval 8/22  Hip flexion     Hip extension     Hip abduction     Hip adduction     Hip internal rotation     Hip external rotation     Knee flexion   125 bil   Knee extension     Ankle dorsiflexion 0 0 12 right  Ankle plantarflexion     Ankle inversion 25 (+pain)  30 28 right  Ankle eversion   38 right    (Blank rows = not tested)  LOWER EXTREMITY MMT:  MMT Right eval Left eval 8/4 8/22  Hip flexion      Hip extension 3/5 3/5 4 right/left 4+  Hip abduction 3/5 3/5 4 right/left 4+  Hip adduction      Hip internal rotation      Hip external rotation      Knee flexion 4/5 4/5 4+ right/left 4+  Knee extension 5/5 5/5 4 right/ 5 left 4 right   Ankle dorsiflexion 5/5     Ankle plantarflexion Unable to complete full range heel raise X10 reps     Ankle inversion 5/5     Ankle eversion 5/5      (Blank rows = not tested)  LOWER EXTREMITY SPECIAL TESTS:    FUNCTIONAL TESTS:  8/22 Single leg heel raises 10x proficient    GAIT: Distance walked:  Assistive device utilized:  Level of assistance:  Comments: Pt ambulates with decreased glute activation, (+) trendelenburg bilateral, hip adduction, foot eversion  Stairs: (+) trendelenburg, poor eccentric control bilaterally when descending steps    TODAY'S TREATMENT: 9/21: Nustep x 5 min Level 1 (PT present to discuss progress) Wobble board: DF/PF, Inv/Ev, cw/ccw x 20 each Marble pick up x 10 Marbles Seated toe scrunches x 10 (10 scrunches) Seated towel inv/ev x 10 Step up and hold on airex mat x 20 each LE fwd Squats on airex with proper alignment: patient had lateral right knee pain, placed on floor and attempted but still having lateral knee pain when forcing into proper alignment.  Cupping to lateral knee around insertion of distal IT band x 3 min Attempted squats again x 10 ( patient had no pain after cupping)  9/19: Recumbant bike x 5 min Level 1 (PT present to discuss  progress) Seated toe and heel raises x 20 both Seated right ankle inv/ev on half roll LAQ with 6 lb ankle weights x 20 each LE March x 20 with 6 lbs both Seated hip ER (bottom of foot to opposite ankle with hips abducted) with 6 lbs both Up and over hurdle seated 10 each LE with 6 lbs SLS cone touches 3 x 10 each LE (cone on counter top) Step up (bottom step of stairs) fwd without  UE support x 10 each Side step up with eccentric lowering Sit to stand x 10 (chair) Squat to chair (with balance pad in chair) x 10  9/14: Recumbant bike x 5 min Level 1 (PT present to discuss progress) Seated toe and heel raises x 20 both LAQ with 5 lb ankle weights x 20 each LE March x 20 with 5 lbs both Seated hip ER (bottom of foot to opposite ankle with hips abducted) with 5 lbs both Up and over hurdle seated 10 each LE with 5 lbs Seated hamstring curl x 20 with green band Supine quad set x 20 Supine TKE with large green noodle x 20 with 5 lbs Supine SAQ x 20 wit 5 lbs both Supine SLR 2 x 10 each LE both  Hooklying clam x 20 with yellow loop Sidelying clam 2 x 10 yellow loop both sides Supine ankle DF x 20 with red band right  Supine ankle eversion x 20 eccentric with red band right    PATIENT EDUCATION:  Education details: eval findings/POC; HEP Person educated: Patient Education method: Explanation Education comprehension: returned demonstration   HOME EXERCISE PROGRAM: Access Code: [email or web address] URL: [email or web address] Date: 01/19/2022 Prepared by: Ruben Im  Exercises - Supine Bridge with Resistance Band  - 1 x daily - 7 x weekly - 2-3 sets - 10 reps - Sidelying Hip Abduction  - 1 x daily - 7 x weekly - 1-2 sets - 10 reps - Supine Piriformis Stretch with Foot on Ground  - 1 x daily - 7 x weekly - 2 sets - 30 sec hold - Standing Knee Flexion Stretch on Step  - 1 x daily - 7 x weekly - 1 sets - 10 reps - Standing Isometric Hip Abduction with Ball on Wall  - 1 x daily  - 7 x weekly - 1 sets - 5 reps - 5 hold - Long Sitting Isometric Ankle Inversion in Dorsiflexion with Ball at [place]  - 1 x daily - 7 x weekly - 1 sets - 10 reps - 5 hold - Long Sitting Isometric Ankle Eversion in Dorsiflexion with Ball at [place]  - 1 x daily - 7 x weekly - 3 sets - 10 reps - 5 hold - Sit to Stand  - 1 x daily - 7 x weekly - 1 sets - 10 reps - Seated Hip Abduction with Resistance  - 1 x daily - 7 x weekly - 1 sets - 20 reps - Seated Hamstring Curls with Resistance  - 1 x daily - 7 x weekly - 2 sets - 10 reps - Seated Long Arc Quad with Ankle Weight  - 1 x daily - 7 x weekly - 2 sets - 10 reps - Standing Hip Abduction with Counter Support  - 1 x daily - 7 x weekly - 2 sets - 10 reps - Supine Knee Extension Strengthening  - 1 x daily - 7 x weekly - 2 sets - 10 reps - Forward Step Down Touch with Toe  - 1 x daily - 7 x weekly - 1 sets - 10 reps - Seated Quad Set  - 1 x daily - 7 x weekly - 1 sets - 10 reps - 5 hold - Side Stepping with Resistance at Feet  - 1 x daily - 7 x weekly - 1 sets - 5 reps - Standing Gastroc Stretch at Counter  - 1 x daily - 7 x weekly - 1 sets - 2-3 reps - 30 hold -  Standing Soleus Stretch on Foam 1/2 Roll  - 1 x daily - 7 x weekly - 1 sets - 2-3 reps - 30 hold  CLINICAL IMPRESSION: Ailish still has some evidence of instability in the right ankle with higher level balance tasks.  She maintains a valgus knee on right which likely contributes to her ankle issues and distal IT band irritation.  We added cupping to right lateral knee and this seems to have helped with the lateral knee pain.  Patient was able to do squats with good alignment without pain once we did the cupping.   She would benefit from continued skilled PT to meet final goals.      OBJECTIVE IMPAIRMENTS Abnormal gait, decreased activity tolerance, decreased balance, decreased endurance, decreased mobility, difficulty walking, decreased ROM, decreased strength, hypomobility, increased muscle spasms,  impaired flexibility, improper body mechanics, postural dysfunction, and pain.   ACTIVITY LIMITATIONS standing, stairs, and locomotion level  PARTICIPATION LIMITATIONS: community activity  PERSONAL FACTORS Age and Time since onset of injury/illness/exacerbation are also affecting patient's functional outcome.   REHAB POTENTIAL: Good  CLINICAL DECISION MAKING: Evolving/moderate complexity  EVALUATION COMPLEXITY: Moderate   GOALS: Goals reviewed with patient? Yes  SHORT TERM GOALS: Target date: 12/24/2021  Pt will be independent with her initial HEP to improve LE strength and flexibility.  Baseline: Goal status: met 8/4  LONG TERM GOALS: Target date:03/16/22   Pt will have improved Rt gastroc strength, evident by her ability to complete 15 single leg heel raises proficiently.  Baseline:  Goal status: ongoing  2.  Pt will have improved hip strength to 4/5 MMT. Baseline:  Goal status: goal met 8/22   3.  Pt will have increase in ankle dorsiflexion to atleast 13 deg for greater ease descending steps  Baseline:  Goal status: revised   4.  Pt will be able to descend clinic steps with improved eccentric control/strength 4+/5 quads without increase in foot/knee pain and min use of UE support Baseline:  Goal status: revised   5.  Pt will report atleast 50% improvement in her Rt foot pain from the start of PT. Baseline:  Goal status: met 8/22  6. Improved squat and ankle balance/stability needed to return to kayaking in the future.   NEW    PLAN: PT FREQUENCY: 2x/week  PT DURATION: 6 weeks  PLANNED INTERVENTIONS: Therapeutic exercises, Therapeutic activity, Neuromuscular re-education, Balance training, Gait training, Patient/Family education, Joint mobilization, Stair training, Aquatic Therapy, Dry Needling, Cryotherapy, Taping, and Manual therapy  PLAN FOR NEXT SESSION:  continue proximal strengthening including hip strengthening. Nu-step; leg press/calf press;  progress  glute, quad and peroneal strength,  ankle DF ROM, STM as needed peroneals/gastrocs if needed; possible DN   Markeria Goetsch B. Jeshawn Melucci, PT 02/18/22 11:56 AM  [place] [address], [place], [place] [address] Phone # [phone] Fax [phone]

## 2022-02-23 ENCOUNTER — Ambulatory Visit: Payer: PPO

## 2022-02-23 DIAGNOSIS — M6281 Muscle weakness (generalized): Secondary | ICD-10-CM

## 2022-02-23 DIAGNOSIS — M79604 Pain in right leg: Secondary | ICD-10-CM

## 2022-02-23 DIAGNOSIS — M25671 Stiffness of right ankle, not elsewhere classified: Secondary | ICD-10-CM

## 2022-02-23 DIAGNOSIS — H903 Sensorineural hearing loss, bilateral: Secondary | ICD-10-CM | POA: Diagnosis not present

## 2022-02-23 DIAGNOSIS — M79605 Pain in left leg: Secondary | ICD-10-CM

## 2022-02-23 DIAGNOSIS — R262 Difficulty in walking, not elsewhere classified: Secondary | ICD-10-CM

## 2022-02-23 NOTE — Therapy (Signed)
OUTPATIENT PHYSICAL THERAPY LOWER EXTREMITY TREATMENT NOTE   Patient Name: Jamie Glover MRN: [id number] DOB:1956/02/27, 66 y.o., female Today's Date: 02/23/2022   PT End of Session - 02/23/22 1240     Visit Number 19    Date for PT Re-Evaluation 03/16/22    Authorization Type [employer]    Authorization Time Period 12/08/21 to 01/20/22 extended through 10/17    PT Start Time 1230    PT Stop Time 1315    PT Time Calculation (min) 45 min    Activity Tolerance Patient tolerated treatment well    Behavior During Therapy [facility] for tasks assessed/performed               Past Medical History:  Diagnosis Date   Arthritis    Hypertension    OSA (obstructive sleep apnea) 2013   Past Surgical History:  Procedure Laterality Date   BREAST BIOPSY     c-sections     CHOLECYSTECTOMY     CHOLECYSTECTOMY, LAPAROSCOPIC  1996   right knee surgery     Patient Active Problem List   Diagnosis Date Noted   Peroneal tendonitis, right 09/15/2021   Unilateral primary osteoarthritis, left knee 04/14/2020   Primary osteoarthritis of left knee 01/15/2019   Unilateral primary osteoarthritis, right knee 12/26/2017   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 ear 12/19/2017   Eustachian tube dysfunction, left 12/19/2017   Chronic serous otitis media of left ear 12/19/2017   Tinnitus 10/01/2011   Vitamin D deficiency 10/01/2011   GERD (gastroesophageal reflux disease) 10/01/2011   Neck pain 10/01/2011   HTN (hypertension) 10/01/2011   Sleep apnea 09/29/2011   BMI 33.0-33.9,adult 09/29/2011    PCP: Milagros Evener, MD  REFERRING PROVIDER: Faustino Congress, NP  REFERRING DIAG: Pain in Rt knee, Pain in Rt leg, Pain in Lt leg   THERAPY DIAG:  Pain in right leg  Pain in left leg  Stiffness of right ankle, not elsewhere classified  Muscle weakness (generalized)  Difficulty in walking, not elsewhere classified  Rationale  for Evaluation and Treatment Rehabilitation  ONSET DATE: 2 years ago  SUBJECTIVE:   SUBJECTIVE STATEMENT: Patient states she had some increased ankle soreness after last visit.  Wants to try cupping for this.    PERTINENT HISTORY:  Arthritis Rt knee, not much improvement with PT  Pt states that about 2 years ago, she was walking and felt a pop in her Rt foot. Some time went by and her foot was feeling a little better. She went on a trip and did a lot of walking which seemed to cause a flare-up in her foot pain. In January, she saw a podiatrist and was put in a boot for 3 weeks and had no improvement. She was sent to PT but was 1 of 4 pts at a time and felt that after 8 weeks she was not seeing much improvement. While at PT, she also noticed her Lt knee/outer leg was bothering her during her sessions. She has been on Mobic the last week and feels that her pain is better than usual because of this.   PAIN:  Are you having pain? Yes: NPRS scale: 1/10 Pain location: right knee and Rt lateral foot 5th toe to heel  Pain description: sore Aggravating factors: walking, heel raises, standing on 1 leg Relieving factors: resting off her feet  PRECAUTIONS: None  WEIGHT BEARING RESTRICTIONS No  FALLS:  Has patient fallen in last 6  months? No  LIVING ENVIRONMENT: Lives with: lives with their family Lives in: House/apartment Stairs: Yes: External: 5 steps;   Has following equipment at home: None  OCCUPATION: retired Marine scientist   PLOF: Independent  PATIENT GOALS be able to walk without increase in Rt and Lt LE pain    OBJECTIVE:   DIAGNOSTIC FINDINGS: MRI Rt foot  PATIENT SURVEYS:  FOTO 55    8/22:  70% FOTO  COGNITION:  Overall cognitive status: Within functional limits for tasks assessed     SENSATION: Denies numbness/tingling  EDEMA:  None noticed   POSTURE:  LE adduction, foot eversion  Right knee valgus  PALPATION: Tenderness over lateral aspect of the foot, at  peroneal tendon insertion  LOWER EXTREMITY ROM:  Passive ROM Right eval Left eval 8/22  Hip flexion     Hip extension     Hip abduction     Hip adduction     Hip internal rotation     Hip external rotation     Knee flexion   125 bil   Knee extension     Ankle dorsiflexion 0 0 12 right  Ankle plantarflexion     Ankle inversion 25 (+pain)  30 28 right  Ankle eversion   38 right    (Blank rows = not tested)  LOWER EXTREMITY MMT:  MMT Right eval Left eval 8/4 8/22  Hip flexion      Hip extension 3/5 3/5 4 right/left 4+  Hip abduction 3/5 3/5 4 right/left 4+  Hip adduction      Hip internal rotation      Hip external rotation      Knee flexion 4/5 4/5 4+ right/left 4+  Knee extension 5/5 5/5 4 right/ 5 left 4 right   Ankle dorsiflexion 5/5     Ankle plantarflexion Unable to complete full range heel raise X10 reps     Ankle inversion 5/5     Ankle eversion 5/5      (Blank rows = not tested)  LOWER EXTREMITY SPECIAL TESTS:    FUNCTIONAL TESTS:  8/22 Single leg heel raises 10x proficient    GAIT: Distance walked:  Assistive device utilized:  Level of assistance:  Comments: Pt ambulates with decreased glute activation, (+) trendelenburg bilateral, hip adduction, foot eversion  Stairs: (+) trendelenburg, poor eccentric control bilaterally when descending steps    TODAY'S TREATMENT:  9/26: Nustep x 5 min Level 1 (PT present to discuss progress) Cupping to right lateral ankle x 5 min Lateral band walks with blue loop 3 laps of 10 steps each way Leg Press bilateral x 20 with 60lbs, then 40 lbs single leg press on each LE x 20 Attempted single leg cone touches : right side x 10 then right ankle became painful, completed 10 reps on left then moved on Multi-hip x 20 each of hip abduction and extension at 40 lbs Step up and hold on airex mat x 10 on each LE fwd and lateral  9/21: Nustep x 5 min Level 1 (PT present to discuss progress) Wobble board: DF/PF, Inv/Ev,  cw/ccw x 20 each Marble pick up x 10 Marbles Seated toe scrunches x 10 (10 scrunches) Seated towel inv/ev x 10 Step up and hold on airex mat x 20 each LE fwd Squats on airex with proper alignment: patient had lateral right knee pain, placed on floor and attempted but still having lateral knee pain when forcing into proper alignment.  Cupping to lateral knee around insertion of distal  IT band x 3 min Attempted squats again x 10 ( patient had no pain after cupping)  9/19: Recumbant bike x 5 min Level 1 (PT present to discuss progress) Seated toe and heel raises x 20 both Seated right ankle inv/ev on half roll LAQ with 6 lb ankle weights x 20 each LE March x 20 with 6 lbs both Seated hip ER (bottom of foot to opposite ankle with hips abducted) with 6 lbs both Up and over hurdle seated 10 each LE with 6 lbs SLS cone touches 3 x 10 each LE (cone on counter top) Step up (bottom step of stairs) fwd without UE support x 10 each Side step up with eccentric lowering Sit to stand x 10 (chair) Squat to chair (with balance pad in chair) x 10   PATIENT EDUCATION:  Education details: eval findings/POC; HEP Person educated: Patient Education method: Explanation Education comprehension: returned demonstration   HOME EXERCISE PROGRAM: Access Code: [email or web address] URL: [email or web address] Date: 01/19/2022 Prepared by: Ruben Im  Exercises - Supine Bridge with Resistance Band  - 1 x daily - 7 x weekly - 2-3 sets - 10 reps - Sidelying Hip Abduction  - 1 x daily - 7 x weekly - 1-2 sets - 10 reps - Supine Piriformis Stretch with Foot on Ground  - 1 x daily - 7 x weekly - 2 sets - 30 sec hold - Standing Knee Flexion Stretch on Step  - 1 x daily - 7 x weekly - 1 sets - 10 reps - Standing Isometric Hip Abduction with Ball on Wall  - 1 x daily - 7 x weekly - 1 sets - 5 reps - 5 hold - Long Sitting Isometric Ankle Inversion in Dorsiflexion with Ball at [place]  - 1 x daily - 7 x weekly - 1  sets - 10 reps - 5 hold - Long Sitting Isometric Ankle Eversion in Dorsiflexion with Ball at [place]  - 1 x daily - 7 x weekly - 3 sets - 10 reps - 5 hold - Sit to Stand  - 1 x daily - 7 x weekly - 1 sets - 10 reps - Seated Hip Abduction with Resistance  - 1 x daily - 7 x weekly - 1 sets - 20 reps - Seated Hamstring Curls with Resistance  - 1 x daily - 7 x weekly - 2 sets - 10 reps - Seated Long Arc Quad with Ankle Weight  - 1 x daily - 7 x weekly - 2 sets - 10 reps - Standing Hip Abduction with Counter Support  - 1 x daily - 7 x weekly - 2 sets - 10 reps - Supine Knee Extension Strengthening  - 1 x daily - 7 x weekly - 2 sets - 10 reps - Forward Step Down Touch with Toe  - 1 x daily - 7 x weekly - 1 sets - 10 reps - Seated Quad Set  - 1 x daily - 7 x weekly - 1 sets - 10 reps - 5 hold - Side Stepping with Resistance at Feet  - 1 x daily - 7 x weekly - 1 sets - 5 reps - Standing Gastroc Stretch at Counter  - 1 x daily - 7 x weekly - 1 sets - 2-3 reps - 30 hold - Standing Soleus Stretch on Foam 1/2 Roll  - 1 x daily - 7 x weekly - 1 sets - 2-3 reps - 30 hold  CLINICAL IMPRESSION: Toshua responded very well to cupping  for the lateral knee pain.  She had some right lateral ankle pain from last session and wanted to try cupping there as well.  She still felt some discomfort when attempting to do the dynamic single leg stance activities but did not report any pain with basic walking.  We will continue to work on hip strength along with knee and ankle stability.    She would benefit from continued skilled PT to meet final goals.      OBJECTIVE IMPAIRMENTS Abnormal gait, decreased activity tolerance, decreased balance, decreased endurance, decreased mobility, difficulty walking, decreased ROM, decreased strength, hypomobility, increased muscle spasms, impaired flexibility, improper body mechanics, postural dysfunction, and pain.   ACTIVITY LIMITATIONS standing, stairs, and locomotion level  PARTICIPATION  LIMITATIONS: community activity  PERSONAL FACTORS Age and Time since onset of injury/illness/exacerbation are also affecting patient's functional outcome.   REHAB POTENTIAL: Good  CLINICAL DECISION MAKING: Evolving/moderate complexity  EVALUATION COMPLEXITY: Moderate   GOALS: Goals reviewed with patient? Yes  SHORT TERM GOALS: Target date: 12/24/2021  Pt will be independent with her initial HEP to improve LE strength and flexibility.  Baseline: Goal status: met 8/4  LONG TERM GOALS: Target date:03/16/22   Pt will have improved Rt gastroc strength, evident by her ability to complete 15 single leg heel raises proficiently.  Baseline:  Goal status: ongoing  2.  Pt will have improved hip strength to 4/5 MMT. Baseline:  Goal status: goal met 8/22   3.  Pt will have increase in ankle dorsiflexion to atleast 13 deg for greater ease descending steps  Baseline:  Goal status: revised   4.  Pt will be able to descend clinic steps with improved eccentric control/strength 4+/5 quads without increase in foot/knee pain and min use of UE support Baseline:  Goal status: revised   5.  Pt will report atleast 50% improvement in her Rt foot pain from the start of PT. Baseline:  Goal status: met 8/22  6. Improved squat and ankle balance/stability needed to return to kayaking in the future.   NEW    PLAN: PT FREQUENCY: 2x/week  PT DURATION: 6 weeks  PLANNED INTERVENTIONS: Therapeutic exercises, Therapeutic activity, Neuromuscular re-education, Balance training, Gait training, Patient/Family education, Joint mobilization, Stair training, Aquatic Therapy, Dry Needling, Cryotherapy, Taping, and Manual therapy  PLAN FOR NEXT SESSION:  General re-assessment next visit including FOTO. Continue proximal strengthening including hip strengthening. Nu-step; leg press/calf press;  progress glute, quad and peroneal strength,  ankle DF ROM, STM as needed peroneals/gastrocs if needed; possible DN    Shardea Cwynar B. Rashad Auld, PT 02/23/22 1:32 PM  [facility] [address], [place], [place] [address] Phone # [phone] Fax [phone]

## 2022-02-25 ENCOUNTER — Ambulatory Visit: Payer: PPO

## 2022-02-25 DIAGNOSIS — M79604 Pain in right leg: Secondary | ICD-10-CM | POA: Diagnosis not present

## 2022-02-25 DIAGNOSIS — M6281 Muscle weakness (generalized): Secondary | ICD-10-CM

## 2022-02-25 DIAGNOSIS — M25671 Stiffness of right ankle, not elsewhere classified: Secondary | ICD-10-CM

## 2022-02-25 DIAGNOSIS — R262 Difficulty in walking, not elsewhere classified: Secondary | ICD-10-CM

## 2022-02-25 DIAGNOSIS — M79605 Pain in left leg: Secondary | ICD-10-CM

## 2022-02-25 NOTE — Therapy (Signed)
OUTPATIENT PHYSICAL THERAPY LOWER EXTREMITY TREATMENT NOTE   Patient Name: Jamie Glover MRN: [id number] DOB:Dec 01, 1955, 66 y.o., female Today's Date: 02/25/2022   PT End of Session - 02/25/22 1313     Visit Number 20    Date for PT Re-Evaluation 03/16/22    Authorization Type [employer]    Authorization Time Period 12/08/21 to 01/20/22 extended through 10/17    PT Start Time 1015    PT Stop Time 1100    PT Time Calculation (min) 45 min    Activity Tolerance Patient tolerated treatment well    Behavior During Therapy [facility] for tasks assessed/performed                Past Medical History:  Diagnosis Date   Arthritis    Hypertension    OSA (obstructive sleep apnea) 2013   Past Surgical History:  Procedure Laterality Date   BREAST BIOPSY     c-sections     CHOLECYSTECTOMY     CHOLECYSTECTOMY, LAPAROSCOPIC  1996   right knee surgery     Patient Active Problem List   Diagnosis Date Noted   Peroneal tendonitis, right 09/15/2021   Unilateral primary osteoarthritis, left knee 04/14/2020   Primary osteoarthritis of left knee 01/15/2019   Unilateral primary osteoarthritis, right knee 12/26/2017   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 ear 12/19/2017   Eustachian tube dysfunction, left 12/19/2017   Chronic serous otitis media of left ear 12/19/2017   Tinnitus 10/01/2011   Vitamin D deficiency 10/01/2011   GERD (gastroesophageal reflux disease) 10/01/2011   Neck pain 10/01/2011   HTN (hypertension) 10/01/2011   Sleep apnea 09/29/2011   BMI 33.0-33.9,adult 09/29/2011    PCP: Milagros Evener, MD  REFERRING PROVIDER: Faustino Congress, NP  REFERRING DIAG: Pain in Rt knee, Pain in Rt leg, Pain in Lt leg   THERAPY DIAG:  Pain in right leg  Pain in left leg  Stiffness of right ankle, not elsewhere classified  Muscle weakness (generalized)  Difficulty in walking, not elsewhere classified  Rationale  for Evaluation and Treatment Rehabilitation  ONSET DATE: 2 years ago  SUBJECTIVE:   SUBJECTIVE STATEMENT: Patient states ankle did not respond to cupping.      PERTINENT HISTORY:  Arthritis Rt knee, not much improvement with PT  Pt states that about 2 years ago, she was walking and felt a pop in her Rt foot. Some time went by and her foot was feeling a little better. She went on a trip and did a lot of walking which seemed to cause a flare-up in her foot pain. In January, she saw a podiatrist and was put in a boot for 3 weeks and had no improvement. She was sent to PT but was 1 of 4 pts at a time and felt that after 8 weeks she was not seeing much improvement. While at PT, she also noticed her Lt knee/outer leg was bothering her during her sessions. She has been on Mobic the last week and feels that her pain is better than usual because of this.   PAIN:  Are you having pain? Yes: NPRS scale: 1/10 Pain location: right knee and Rt lateral foot 5th toe to heel  Pain description: sore Aggravating factors: walking, heel raises, standing on 1 leg Relieving factors: resting off her feet  PRECAUTIONS: None  WEIGHT BEARING RESTRICTIONS No  FALLS:  Has patient fallen in last 6 months? No  LIVING ENVIRONMENT: Lives with:  lives with their family Lives in: House/apartment Stairs: Yes: External: 5 steps;   Has following equipment at home: None  OCCUPATION: retired Marine scientist   PLOF: Independent  PATIENT GOALS be able to walk without increase in Rt and Lt LE pain    OBJECTIVE:   DIAGNOSTIC FINDINGS: MRI Rt foot  PATIENT SURVEYS:  FOTO 55    8/22:  70% FOTO  COGNITION:  Overall cognitive status: Within functional limits for tasks assessed     SENSATION: Denies numbness/tingling  EDEMA:  None noticed   POSTURE:  LE adduction, foot eversion  Right knee valgus  PALPATION: Tenderness over lateral aspect of the foot, at peroneal tendon insertion  LOWER EXTREMITY  ROM:  Passive ROM Right eval Left eval 8/22  Hip flexion     Hip extension     Hip abduction     Hip adduction     Hip internal rotation     Hip external rotation     Knee flexion   125 bil   Knee extension     Ankle dorsiflexion 0 0 12 right  Ankle plantarflexion     Ankle inversion 25 (+pain)  30 28 right  Ankle eversion   38 right    (Blank rows = not tested)  LOWER EXTREMITY MMT:  MMT Right eval Left eval 8/4 8/22  Hip flexion      Hip extension 3/5 3/5 4 right/left 4+  Hip abduction 3/5 3/5 4 right/left 4+  Hip adduction      Hip internal rotation      Hip external rotation      Knee flexion 4/5 4/5 4+ right/left 4+  Knee extension 5/5 5/5 4 right/ 5 left 4 right   Ankle dorsiflexion 5/5     Ankle plantarflexion Unable to complete full range heel raise X10 reps     Ankle inversion 5/5     Ankle eversion 5/5      (Blank rows = not tested)  LOWER EXTREMITY SPECIAL TESTS:    FUNCTIONAL TESTS:  8/22 Single leg heel raises 10x proficient    GAIT: Distance walked:  Assistive device utilized:  Level of assistance:  Comments: Pt ambulates with decreased glute activation, (+) trendelenburg bilateral, hip adduction, foot eversion  Stairs: (+) trendelenburg, poor eccentric control bilaterally when descending steps    TODAY'S TREATMENT: 9/28: Recumbent bike x 5 min Level 1 (PT present to discuss progress) Multi-hip x 20 each of hip abduction and extension at 40 lbs Lateral band walks with blue loop 3 laps of 10 steps each way Step up and hold on airex mat x 10 on each LE fwd and lateral Leg Press bilateral x 20 with 60lbs, then 40 lbs single leg press on each LE x 20 Myofascial tissue work at lateral ankle using hawks grips.  (IASTM) x 10 min along with PROM of right ankle and cross friction massage to peroneal tendon.    9/26: Nustep x 5 min Level 1 (PT present to discuss progress) Cupping to right lateral ankle x 5 min Lateral band walks with blue loop 3  laps of 10 steps each way Leg Press bilateral x 20 with 60lbs, then 40 lbs single leg press on each LE x 20 Attempted single leg cone touches : right side x 10 then right ankle became painful, completed 10 reps on left then moved on Multi-hip x 20 each of hip abduction and extension at 40 lbs Step up and hold on airex mat x 10 on  each LE fwd and lateral  9/21: Nustep x 5 min Level 1 (PT present to discuss progress) Wobble board: DF/PF, Inv/Ev, cw/ccw x 20 each Marble pick up x 10 Marbles Seated toe scrunches x 10 (10 scrunches) Seated towel inv/ev x 10 Step up and hold on airex mat x 20 each LE fwd Squats on airex with proper alignment: patient had lateral right knee pain, placed on floor and attempted but still having lateral knee pain when forcing into proper alignment.  Cupping to lateral knee around insertion of distal IT band x 3 min Attempted squats again x 10 ( patient had no pain after cupping)  9/19: Recumbant bike x 5 min Level 1 (PT present to discuss progress) Seated toe and heel raises x 20 both Seated right ankle inv/ev on half roll LAQ with 6 lb ankle weights x 20 each LE March x 20 with 6 lbs both Seated hip ER (bottom of foot to opposite ankle with hips abducted) with 6 lbs both Up and over hurdle seated 10 each LE with 6 lbs SLS cone touches 3 x 10 each LE (cone on counter top) Step up (bottom step of stairs) fwd without UE support x 10 each Side step up with eccentric lowering Sit to stand x 10 (chair) Squat to chair (with balance pad in chair) x 10   PATIENT EDUCATION:  Education details: eval findings/POC; HEP Person educated: Patient Education method: Explanation Education comprehension: returned demonstration   HOME EXERCISE PROGRAM: Access Code: [email or web address] URL: [email or web address] Date: 01/19/2022 Prepared by: Ruben Im  Exercises - Supine Bridge with Resistance Band  - 1 x daily - 7 x weekly - 2-3 sets - 10 reps - Sidelying Hip  Abduction  - 1 x daily - 7 x weekly - 1-2 sets - 10 reps - Supine Piriformis Stretch with Foot on Ground  - 1 x daily - 7 x weekly - 2 sets - 30 sec hold - Standing Knee Flexion Stretch on Step  - 1 x daily - 7 x weekly - 1 sets - 10 reps - Standing Isometric Hip Abduction with Ball on Wall  - 1 x daily - 7 x weekly - 1 sets - 5 reps - 5 hold - Long Sitting Isometric Ankle Inversion in Dorsiflexion with Ball at [place]  - 1 x daily - 7 x weekly - 1 sets - 10 reps - 5 hold - Long Sitting Isometric Ankle Eversion in Dorsiflexion with Ball at [place]  - 1 x daily - 7 x weekly - 3 sets - 10 reps - 5 hold - Sit to Stand  - 1 x daily - 7 x weekly - 1 sets - 10 reps - Seated Hip Abduction with Resistance  - 1 x daily - 7 x weekly - 1 sets - 20 reps - Seated Hamstring Curls with Resistance  - 1 x daily - 7 x weekly - 2 sets - 10 reps - Seated Long Arc Quad with Ankle Weight  - 1 x daily - 7 x weekly - 2 sets - 10 reps - Standing Hip Abduction with Counter Support  - 1 x daily - 7 x weekly - 2 sets - 10 reps - Supine Knee Extension Strengthening  - 1 x daily - 7 x weekly - 2 sets - 10 reps - Forward Step Down Touch with Toe  - 1 x daily - 7 x weekly - 1 sets - 10 reps - Seated Quad Set  - 1 x daily -  7 x weekly - 1 sets - 10 reps - 5 hold - Side Stepping with Resistance at Feet  - 1 x daily - 7 x weekly - 1 sets - 5 reps - Standing Gastroc Stretch at Counter  - 1 x daily - 7 x weekly - 1 sets - 2-3 reps - 30 hold - Standing Soleus Stretch on Foam 1/2 Roll  - 1 x daily - 7 x weekly - 1 sets - 2-3 reps - 30 hold  CLINICAL IMPRESSION: Eimy responded well to IASTM to lateral ankle.  She reported relief of symptoms that were present upon entering clinic today.    We will continue to work on hip strength along with knee and ankle stability.    She would benefit from continued skilled PT to meet final goals.    OBJECTIVE IMPAIRMENTS Abnormal gait, decreased activity tolerance, decreased balance, decreased endurance,  decreased mobility, difficulty walking, decreased ROM, decreased strength, hypomobility, increased muscle spasms, impaired flexibility, improper body mechanics, postural dysfunction, and pain.   ACTIVITY LIMITATIONS standing, stairs, and locomotion level  PARTICIPATION LIMITATIONS: community activity  PERSONAL FACTORS Age and Time since onset of injury/illness/exacerbation are also affecting patient's functional outcome.   REHAB POTENTIAL: Good  CLINICAL DECISION MAKING: Evolving/moderate complexity  EVALUATION COMPLEXITY: Moderate   GOALS: Goals reviewed with patient? Yes  SHORT TERM GOALS: Target date: 12/24/2021  Pt will be independent with her initial HEP to improve LE strength and flexibility.  Baseline: Goal status: met 8/4  LONG TERM GOALS: Target date:03/16/22   Pt will have improved Rt gastroc strength, evident by her ability to complete 15 single leg heel raises proficiently.  Baseline:  Goal status: ongoing  2.  Pt will have improved hip strength to 4/5 MMT. Baseline:  Goal status: goal met 8/22   3.  Pt will have increase in ankle dorsiflexion to atleast 13 deg for greater ease descending steps  Baseline:  Goal status: revised   4.  Pt will be able to descend clinic steps with improved eccentric control/strength 4+/5 quads without increase in foot/knee pain and min use of UE support Baseline:  Goal status: revised   5.  Pt will report atleast 50% improvement in her Rt foot pain from the start of PT. Baseline:  Goal status: met 8/22  6. Improved squat and ankle balance/stability needed to return to kayaking in the future.   NEW    PLAN: PT FREQUENCY: 2x/week  PT DURATION: 6 weeks  PLANNED INTERVENTIONS: Therapeutic exercises, Therapeutic activity, Neuromuscular re-education, Balance training, Gait training, Patient/Family education, Joint mobilization, Stair training, Aquatic Therapy, Dry Needling, Cryotherapy, Taping, and Manual therapy  PLAN FOR  NEXT SESSION:   Continue proximal strengthening including hip strengthening. Nu-step; leg press/calf press;  progress glute, quad and peroneal strength,  ankle DF ROM, STM as needed peroneals/gastrocs if needed; possible DN   Tobiah Celestine B. Kamisha Ell, PT 02/25/22 1:18 PM  [facility] [address], [address] [place], [place] [address] Phone # [phone] Fax [phone]

## 2022-03-04 ENCOUNTER — Ambulatory Visit: Payer: PPO | Attending: Family Medicine

## 2022-03-04 DIAGNOSIS — M6281 Muscle weakness (generalized): Secondary | ICD-10-CM | POA: Diagnosis not present

## 2022-03-04 DIAGNOSIS — E782 Mixed hyperlipidemia: Secondary | ICD-10-CM | POA: Diagnosis not present

## 2022-03-04 DIAGNOSIS — M79604 Pain in right leg: Secondary | ICD-10-CM | POA: Diagnosis not present

## 2022-03-04 DIAGNOSIS — M79605 Pain in left leg: Secondary | ICD-10-CM | POA: Insufficient documentation

## 2022-03-04 DIAGNOSIS — E669 Obesity, unspecified: Secondary | ICD-10-CM | POA: Diagnosis not present

## 2022-03-04 DIAGNOSIS — R7301 Impaired fasting glucose: Secondary | ICD-10-CM | POA: Diagnosis not present

## 2022-03-04 DIAGNOSIS — M25671 Stiffness of right ankle, not elsewhere classified: Secondary | ICD-10-CM | POA: Insufficient documentation

## 2022-03-04 DIAGNOSIS — I10 Essential (primary) hypertension: Secondary | ICD-10-CM | POA: Diagnosis not present

## 2022-03-04 DIAGNOSIS — R262 Difficulty in walking, not elsewhere classified: Secondary | ICD-10-CM | POA: Insufficient documentation

## 2022-03-04 DIAGNOSIS — Z6836 Body mass index (BMI) 36.0-36.9, adult: Secondary | ICD-10-CM | POA: Diagnosis not present

## 2022-03-04 NOTE — Therapy (Signed)
OUTPATIENT PHYSICAL THERAPY LOWER EXTREMITY TREATMENT NOTE   Patient Name: Jamie Glover MRN: [id number] DOB:01-Nov-1955, 66 y.o., female Today's Date: 03/04/2022   PT End of Session - 03/04/22 1018     Visit Number 21    Date for PT Re-Evaluation 03/16/22    Authorization Type [employer]    Authorization Time Period 12/08/21 to 01/20/22 extended through 10/17    PT Start Time 1015    PT Stop Time 1100    PT Time Calculation (min) 45 min    Activity Tolerance Patient tolerated treatment well    Behavior During Therapy [facility] for tasks assessed/performed                Past Medical History:  Diagnosis Date   Arthritis    Hypertension    OSA (obstructive sleep apnea) 2013   Past Surgical History:  Procedure Laterality Date   BREAST BIOPSY     c-sections     CHOLECYSTECTOMY     CHOLECYSTECTOMY, LAPAROSCOPIC  1996   right knee surgery     Patient Active Problem List   Diagnosis Date Noted   Peroneal tendonitis, right 09/15/2021   Unilateral primary osteoarthritis, left knee 04/14/2020   Primary osteoarthritis of left knee 01/15/2019   Unilateral primary osteoarthritis, right knee 12/26/2017   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 ear 12/19/2017   Eustachian tube dysfunction, left 12/19/2017   Chronic serous otitis media of left ear 12/19/2017   Tinnitus 10/01/2011   Vitamin D deficiency 10/01/2011   GERD (gastroesophageal reflux disease) 10/01/2011   Neck pain 10/01/2011   HTN (hypertension) 10/01/2011   Sleep apnea 09/29/2011   BMI 33.0-33.9,adult 09/29/2011    PCP: Milagros Evener, MD  REFERRING PROVIDER: Faustino Congress, NP  REFERRING DIAG: Pain in Rt knee, Pain in Rt leg, Pain in Lt leg   THERAPY DIAG:  Pain in right leg  Pain in left leg  Stiffness of right ankle, not elsewhere classified  Muscle weakness (generalized)  Difficulty in walking, not elsewhere classified  Rationale  for Evaluation and Treatment Rehabilitation  ONSET DATE: 2 years ago  SUBJECTIVE:   SUBJECTIVE STATEMENT: Patient states she went on a trip since last visit and took her Mobic to be sure she was able to keep up with all of the activity. She did really well but stopped this once she got back and has not had any pain in the knee or ankle.       PERTINENT HISTORY:  Arthritis Rt knee, not much improvement with PT  Pt states that about 2 years ago, she was walking and felt a pop in her Rt foot. Some time went by and her foot was feeling a little better. She went on a trip and did a lot of walking which seemed to cause a flare-up in her foot pain. In January, she saw a podiatrist and was put in a boot for 3 weeks and had no improvement. She was sent to PT but was 1 of 4 pts at a time and felt that after 8 weeks she was not seeing much improvement. While at PT, she also noticed her Lt knee/outer leg was bothering her during her sessions. She has been on Mobic the last week and feels that her pain is better than usual because of this.   PAIN:  Are you having pain? Yes: NPRS scale: 1/10 Pain location: right knee and Rt lateral foot 5th toe to heel  Pain description: sore Aggravating factors: walking, heel raises, standing on 1 leg Relieving factors: resting off her feet  PRECAUTIONS: None  WEIGHT BEARING RESTRICTIONS No  FALLS:  Has patient fallen in last 6 months? No  LIVING ENVIRONMENT: Lives with: lives with their family Lives in: House/apartment Stairs: Yes: External: 5 steps;   Has following equipment at home: None  OCCUPATION: retired Marine scientist   PLOF: Independent  PATIENT GOALS be able to walk without increase in Rt and Lt LE pain    OBJECTIVE:   DIAGNOSTIC FINDINGS: MRI Rt foot  PATIENT SURVEYS:  FOTO 55    8/22:  70% FOTO  COGNITION:  Overall cognitive status: Within functional limits for tasks assessed     SENSATION: Denies numbness/tingling  EDEMA:  None  noticed   POSTURE:  LE adduction, foot eversion  Right knee valgus  PALPATION: Tenderness over lateral aspect of the foot, at peroneal tendon insertion  LOWER EXTREMITY ROM:  Passive ROM Right eval Left eval 8/22  Hip flexion     Hip extension     Hip abduction     Hip adduction     Hip internal rotation     Hip external rotation     Knee flexion   125 bil   Knee extension     Ankle dorsiflexion 0 0 12 right  Ankle plantarflexion     Ankle inversion 25 (+pain)  30 28 right  Ankle eversion   38 right    (Blank rows = not tested)  LOWER EXTREMITY MMT:  MMT Right eval Left eval 8/4 8/22  Hip flexion      Hip extension 3/5 3/5 4 right/left 4+  Hip abduction 3/5 3/5 4 right/left 4+  Hip adduction      Hip internal rotation      Hip external rotation      Knee flexion 4/5 4/5 4+ right/left 4+  Knee extension 5/5 5/5 4 right/ 5 left 4 right   Ankle dorsiflexion 5/5     Ankle plantarflexion Unable to complete full range heel raise X10 reps     Ankle inversion 5/5     Ankle eversion 5/5      (Blank rows = not tested)  LOWER EXTREMITY SPECIAL TESTS:    FUNCTIONAL TESTS:  8/22 Single leg heel raises 10x proficient    GAIT: Distance walked:  Assistive device utilized:  Level of assistance:  Comments: Pt ambulates with decreased glute activation, (+) trendelenburg bilateral, hip adduction, foot eversion  Stairs: (+) trendelenburg, poor eccentric control bilaterally when descending steps    TODAY'S TREATMENT: 10/4: Recumbent bike x 5 min Level 1 (PT present to discuss progress) Leg Press bilateral x 20 with 65lbs, then 45 lbs single leg press on each LE x 20 Multi-hip x 20 each of hip abduction and extension at 40 lbs Lateral band walks with blue loop 3 laps of 10 steps each way Step up and hold on airex mat x 10 on each LE fwd and lateral Myofascial tissue work at lateral ankle using small suction cup x 10 min along with PROM of right ankle and cross friction  massage to peroneal tendon.    9/28: Recumbent bike x 5 min Level 1 (PT present to discuss progress) Multi-hip x 20 each of hip abduction and extension at 40 lbs Lateral band walks with blue loop 3 laps of 10 steps each way Step up and hold on airex mat x 10 on each LE fwd and lateral Leg Press  bilateral x 20 with 60lbs, then 40 lbs single leg press on each LE x 20 Myofascial tissue work at lateral ankle using hawks grips.  (IASTM) x 10 min along with PROM of right ankle and cross friction massage to peroneal tendon.    9/26: Nustep x 5 min Level 1 (PT present to discuss progress) Cupping to right lateral ankle x 5 min Lateral band walks with blue loop 3 laps of 10 steps each way Leg Press bilateral x 20 with 60lbs, then 40 lbs single leg press on each LE x 20 Attempted single leg cone touches : right side x 10 then right ankle became painful, completed 10 reps on left then moved on Multi-hip x 20 each of hip abduction and extension at 40 lbs Step up and hold on airex mat x 10 on each LE fwd and lateral   PATIENT EDUCATION:  Education details: eval findings/POC; HEP Person educated: Patient Education method: Explanation Education comprehension: returned demonstration   HOME EXERCISE PROGRAM: Access Code: [email or web address] URL: [email or web address] Date: 01/19/2022 Prepared by: Ruben Im  Exercises - Supine Bridge with Resistance Band  - 1 x daily - 7 x weekly - 2-3 sets - 10 reps - Sidelying Hip Abduction  - 1 x daily - 7 x weekly - 1-2 sets - 10 reps - Supine Piriformis Stretch with Foot on Ground  - 1 x daily - 7 x weekly - 2 sets - 30 sec hold - Standing Knee Flexion Stretch on Step  - 1 x daily - 7 x weekly - 1 sets - 10 reps - Standing Isometric Hip Abduction with Ball on Wall  - 1 x daily - 7 x weekly - 1 sets - 5 reps - 5 hold - Long Sitting Isometric Ankle Inversion in Dorsiflexion with Ball at [place]  - 1 x daily - 7 x weekly - 1 sets - 10 reps - 5 hold - Long  Sitting Isometric Ankle Eversion in Dorsiflexion with Ball at [place]  - 1 x daily - 7 x weekly - 3 sets - 10 reps - 5 hold - Sit to Stand  - 1 x daily - 7 x weekly - 1 sets - 10 reps - Seated Hip Abduction with Resistance  - 1 x daily - 7 x weekly - 1 sets - 20 reps - Seated Hamstring Curls with Resistance  - 1 x daily - 7 x weekly - 2 sets - 10 reps - Seated Long Arc Quad with Ankle Weight  - 1 x daily - 7 x weekly - 2 sets - 10 reps - Standing Hip Abduction with Counter Support  - 1 x daily - 7 x weekly - 2 sets - 10 reps - Supine Knee Extension Strengthening  - 1 x daily - 7 x weekly - 2 sets - 10 reps - Forward Step Down Touch with Toe  - 1 x daily - 7 x weekly - 1 sets - 10 reps - Seated Quad Set  - 1 x daily - 7 x weekly - 1 sets - 10 reps - 5 hold - Side Stepping with Resistance at Feet  - 1 x daily - 7 x weekly - 1 sets - 5 reps - Standing Gastroc Stretch at Counter  - 1 x daily - 7 x weekly - 1 sets - 2-3 reps - 30 hold - Standing Soleus Stretch on Foam 1/2 Roll  - 1 x daily - 7 x weekly - 1 sets - 2-3 reps - 30  hold  CLINICAL IMPRESSION: Lyndy responded well to IASTM to lateral ankle last visit.  She was able to travel to [place] and had to do a lot of walking.   She did take her Mobic while she was away but she stopped this upon returning home and is still pain free.   We tried cupping and soft tissue mobilization again today.  We will continue to work on hip strength along with knee and ankle stability.    She would benefit from continued skilled PT to meet final goals.    OBJECTIVE IMPAIRMENTS Abnormal gait, decreased activity tolerance, decreased balance, decreased endurance, decreased mobility, difficulty walking, decreased ROM, decreased strength, hypomobility, increased muscle spasms, impaired flexibility, improper body mechanics, postural dysfunction, and pain.   ACTIVITY LIMITATIONS standing, stairs, and locomotion level  PARTICIPATION LIMITATIONS: community activity  PERSONAL  FACTORS Age and Time since onset of injury/illness/exacerbation are also affecting patient's functional outcome.   REHAB POTENTIAL: Good  CLINICAL DECISION MAKING: Evolving/moderate complexity  EVALUATION COMPLEXITY: Moderate   GOALS: Goals reviewed with patient? Yes  SHORT TERM GOALS: Target date: 12/24/2021  Pt will be independent with her initial HEP to improve LE strength and flexibility.  Baseline: Goal status: met 8/4  LONG TERM GOALS: Target date:03/16/22   Pt will have improved Rt gastroc strength, evident by her ability to complete 15 single leg heel raises proficiently.  Baseline:  Goal status: ongoing  2.  Pt will have improved hip strength to 4/5 MMT. Baseline:  Goal status: goal met 8/22   3.  Pt will have increase in ankle dorsiflexion to atleast 13 deg for greater ease descending steps  Baseline:  Goal status: revised   4.  Pt will be able to descend clinic steps with improved eccentric control/strength 4+/5 quads without increase in foot/knee pain and min use of UE support Baseline:  Goal status: revised   5.  Pt will report atleast 50% improvement in her Rt foot pain from the start of PT. Baseline:  Goal status: met 8/22  6. Improved squat and ankle balance/stability needed to return to kayaking in the future.   NEW    PLAN: PT FREQUENCY: 2x/week  PT DURATION: 6 weeks  PLANNED INTERVENTIONS: Therapeutic exercises, Therapeutic activity, Neuromuscular re-education, Balance training, Gait training, Patient/Family education, Joint mobilization, Stair training, Aquatic Therapy, Dry Needling, Cryotherapy, Taping, and Manual therapy  PLAN FOR NEXT SESSION:   Continue proximal strengthening including hip strengthening. Nu-step; leg press/calf press;  progress glute, quad and peroneal strength,  ankle DF ROM, STM as needed peroneals/gastrocs if needed; possible DN   Prairie Stenberg B. Solei Wubben, PT 03/04/22 2:34 PM  [facility] [address], [place], [place] [address] Phone # [phone] Fax [phone]

## 2022-03-11 ENCOUNTER — Ambulatory Visit: Payer: PPO

## 2022-03-11 DIAGNOSIS — M79604 Pain in right leg: Secondary | ICD-10-CM

## 2022-03-11 DIAGNOSIS — M79605 Pain in left leg: Secondary | ICD-10-CM

## 2022-03-11 DIAGNOSIS — M6281 Muscle weakness (generalized): Secondary | ICD-10-CM

## 2022-03-11 DIAGNOSIS — M25671 Stiffness of right ankle, not elsewhere classified: Secondary | ICD-10-CM

## 2022-03-11 DIAGNOSIS — R262 Difficulty in walking, not elsewhere classified: Secondary | ICD-10-CM

## 2022-03-11 NOTE — Therapy (Signed)
OUTPATIENT PHYSICAL THERAPY LOWER EXTREMITY TREATMENT NOTE   Patient Name: MARISSAH VANDEMARK MRN: [id number] DOB:January 29, 1956, 66 y.o., female Today's Date: 03/11/2022   PT End of Session - 03/11/22 1106     Visit Number 22    Date for PT Re-Evaluation 03/16/22    Authorization Type [employer]    Authorization Time Period 12/08/21 to 01/20/22 extended through 10/17    PT Start Time 1100    PT Stop Time 1145    PT Time Calculation (min) 45 min    Activity Tolerance Patient tolerated treatment well    Behavior During Therapy [facility] for tasks assessed/performed                Past Medical History:  Diagnosis Date   Arthritis    Hypertension    OSA (obstructive sleep apnea) 2013   Past Surgical History:  Procedure Laterality Date   BREAST BIOPSY     c-sections     CHOLECYSTECTOMY     CHOLECYSTECTOMY, LAPAROSCOPIC  1996   right knee surgery     Patient Active Problem List   Diagnosis Date Noted   Peroneal tendonitis, right 09/15/2021   Unilateral primary osteoarthritis, left knee 04/14/2020   Primary osteoarthritis of left knee 01/15/2019   Unilateral primary osteoarthritis, right knee 12/26/2017   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 ear 12/19/2017   Eustachian tube dysfunction, left 12/19/2017   Chronic serous otitis media of left ear 12/19/2017   Tinnitus 10/01/2011   Vitamin D deficiency 10/01/2011   GERD (gastroesophageal reflux disease) 10/01/2011   Neck pain 10/01/2011   HTN (hypertension) 10/01/2011   Sleep apnea 09/29/2011   BMI 33.0-33.9,adult 09/29/2011    PCP: Milagros Evener, MD  REFERRING PROVIDER: Faustino Congress, NP  REFERRING DIAG: Pain in Rt knee, Pain in Rt leg, Pain in Lt leg   THERAPY DIAG:  Pain in right leg  Pain in left leg  Stiffness of right ankle, not elsewhere classified  Muscle weakness (generalized)  Difficulty in walking, not elsewhere classified  Rationale  for Evaluation and Treatment Rehabilitation  ONSET DATE: 2 years ago  SUBJECTIVE:   SUBJECTIVE STATEMENT: Patient states she is doing well.  She got her cups that she ordered but hers did not come with a small one like we use on her ankle so she may have to order another one.        PERTINENT HISTORY:  Arthritis Rt knee, not much improvement with PT  Pt states that about 2 years ago, she was walking and felt a pop in her Rt foot. Some time went by and her foot was feeling a little better. She went on a trip and did a lot of walking which seemed to cause a flare-up in her foot pain. In January, she saw a podiatrist and was put in a boot for 3 weeks and had no improvement. She was sent to PT but was 1 of 4 pts at a time and felt that after 8 weeks she was not seeing much improvement. While at PT, she also noticed her Lt knee/outer leg was bothering her during her sessions. She has been on Mobic the last week and feels that her pain is better than usual because of this.   PAIN:  Are you having pain? Yes: NPRS scale: 1/10 Pain location: right knee and Rt lateral foot 5th toe to heel  Pain description: sore Aggravating factors: walking, heel raises, standing on 1  leg Relieving factors: resting off her feet  PRECAUTIONS: None  WEIGHT BEARING RESTRICTIONS No  FALLS:  Has patient fallen in last 6 months? No  LIVING ENVIRONMENT: Lives with: lives with their family Lives in: House/apartment Stairs: Yes: External: 5 steps;   Has following equipment at home: None  OCCUPATION: retired Marine scientist   PLOF: Independent  PATIENT GOALS be able to walk without increase in Rt and Lt LE pain    OBJECTIVE:   DIAGNOSTIC FINDINGS: MRI Rt foot  PATIENT SURVEYS:  FOTO 55    8/22:  70% FOTO  COGNITION:  Overall cognitive status: Within functional limits for tasks assessed     SENSATION: Denies numbness/tingling  EDEMA:  None noticed   POSTURE:  LE adduction, foot eversion  Right knee  valgus  PALPATION: Tenderness over lateral aspect of the foot, at peroneal tendon insertion  LOWER EXTREMITY ROM:  Passive ROM Right eval Left eval 8/22  Hip flexion     Hip extension     Hip abduction     Hip adduction     Hip internal rotation     Hip external rotation     Knee flexion   125 bil   Knee extension     Ankle dorsiflexion 0 0 12 right  Ankle plantarflexion     Ankle inversion 25 (+pain)  30 28 right  Ankle eversion   38 right    (Blank rows = not tested)  LOWER EXTREMITY MMT:  MMT Right eval Left eval 8/4 8/22  Hip flexion      Hip extension 3/5 3/5 4 right/left 4+  Hip abduction 3/5 3/5 4 right/left 4+  Hip adduction      Hip internal rotation      Hip external rotation      Knee flexion 4/5 4/5 4+ right/left 4+  Knee extension 5/5 5/5 4 right/ 5 left 4 right   Ankle dorsiflexion 5/5     Ankle plantarflexion Unable to complete full range heel raise X10 reps     Ankle inversion 5/5     Ankle eversion 5/5      (Blank rows = not tested)  LOWER EXTREMITY SPECIAL TESTS:    FUNCTIONAL TESTS:  8/22 Single leg heel raises 10x proficient    GAIT: Distance walked:  Assistive device utilized:  Level of assistance:  Comments: Pt ambulates with decreased glute activation, (+) trendelenburg bilateral, hip adduction, foot eversion  Stairs: (+) trendelenburg, poor eccentric control bilaterally when descending steps    TODAY'S TREATMENT: 10/12: Recumbent bike x 5 min Level 1 (PT present to discuss progress) Multi-hip x 20 each of hip abduction and extension at 40 lbs Lateral band walks with blue loop 3 laps of 10 steps each way Leg Press bilateral x 20 with 70 lbs, then 50 lbs single leg press on each LE x 20 Step up and hold on airex mat x 10 on each LE fwd and lateral Myofascial tissue work at lateral ankle using small suction cup x 10 min along with PROM of right ankle and cross friction massage to peroneal tendon.    10/4: Recumbent bike x 5 min  Level 1 (PT present to discuss progress) Leg Press bilateral x 20 with 65lbs, then 45 lbs single leg press on each LE x 20 Multi-hip x 20 each of hip abduction and extension at 40 lbs Lateral band walks with blue loop 3 laps of 10 steps each way Step up and hold on airex mat x 10  on each LE fwd and lateral Myofascial tissue work at lateral ankle using small suction cup x 10 min along with PROM of right ankle and cross friction massage to peroneal tendon.    9/28: Recumbent bike x 5 min Level 1 (PT present to discuss progress) Multi-hip x 20 each of hip abduction and extension at 40 lbs Lateral band walks with blue loop 3 laps of 10 steps each way Step up and hold on airex mat x 10 on each LE fwd and lateral Leg Press bilateral x 20 with 60lbs, then 40 lbs single leg press on each LE x 20 Myofascial tissue work at lateral ankle using hawks grips.  (IASTM) x 10 min along with PROM of right ankle and cross friction massage to peroneal tendon.    PATIENT EDUCATION:  Education details: eval findings/POC; HEP Person educated: Patient Education method: Explanation Education comprehension: returned demonstration   HOME EXERCISE PROGRAM: Access Code: [email or web address] URL: [email or web address] Date: 01/19/2022 Prepared by: Ruben Im  Exercises - Supine Bridge with Resistance Band  - 1 x daily - 7 x weekly - 2-3 sets - 10 reps - Sidelying Hip Abduction  - 1 x daily - 7 x weekly - 1-2 sets - 10 reps - Supine Piriformis Stretch with Foot on Ground  - 1 x daily - 7 x weekly - 2 sets - 30 sec hold - Standing Knee Flexion Stretch on Step  - 1 x daily - 7 x weekly - 1 sets - 10 reps - Standing Isometric Hip Abduction with Ball on Wall  - 1 x daily - 7 x weekly - 1 sets - 5 reps - 5 hold - Long Sitting Isometric Ankle Inversion in Dorsiflexion with Ball at [place]  - 1 x daily - 7 x weekly - 1 sets - 10 reps - 5 hold - Long Sitting Isometric Ankle Eversion in Dorsiflexion with Ball at [place]   - 1 x daily - 7 x weekly - 3 sets - 10 reps - 5 hold - Sit to Stand  - 1 x daily - 7 x weekly - 1 sets - 10 reps - Seated Hip Abduction with Resistance  - 1 x daily - 7 x weekly - 1 sets - 20 reps - Seated Hamstring Curls with Resistance  - 1 x daily - 7 x weekly - 2 sets - 10 reps - Seated Long Arc Quad with Ankle Weight  - 1 x daily - 7 x weekly - 2 sets - 10 reps - Standing Hip Abduction with Counter Support  - 1 x daily - 7 x weekly - 2 sets - 10 reps - Supine Knee Extension Strengthening  - 1 x daily - 7 x weekly - 2 sets - 10 reps - Forward Step Down Touch with Toe  - 1 x daily - 7 x weekly - 1 sets - 10 reps - Seated Quad Set  - 1 x daily - 7 x weekly - 1 sets - 10 reps - 5 hold - Side Stepping with Resistance at Feet  - 1 x daily - 7 x weekly - 1 sets - 5 reps - Standing Gastroc Stretch at Counter  - 1 x daily - 7 x weekly - 1 sets - 2-3 reps - 30 hold - Standing Soleus Stretch on Foam 1/2 Roll  - 1 x daily - 7 x weekly - 1 sets - 2-3 reps - 30 hold  CLINICAL IMPRESSION: Barba is managing her symptoms well and has good  understanding of HEP and manual techniques.  She has one additional visit left.  We will re-assess and review proper progression of HEP at that time.   OBJECTIVE IMPAIRMENTS Abnormal gait, decreased activity tolerance, decreased balance, decreased endurance, decreased mobility, difficulty walking, decreased ROM, decreased strength, hypomobility, increased muscle spasms, impaired flexibility, improper body mechanics, postural dysfunction, and pain.   ACTIVITY LIMITATIONS standing, stairs, and locomotion level  PARTICIPATION LIMITATIONS: community activity  PERSONAL FACTORS Age and Time since onset of injury/illness/exacerbation are also affecting patient's functional outcome.   REHAB POTENTIAL: Good  CLINICAL DECISION MAKING: Evolving/moderate complexity  EVALUATION COMPLEXITY: Moderate   GOALS: Goals reviewed with patient? Yes  SHORT TERM GOALS: Target date:  12/24/2021  Pt will be independent with her initial HEP to improve LE strength and flexibility.  Baseline: Goal status: met 8/4  LONG TERM GOALS: Target date:03/16/22   Pt will have improved Rt gastroc strength, evident by her ability to complete 15 single leg heel raises proficiently.  Baseline:  Goal status: ongoing  2.  Pt will have improved hip strength to 4/5 MMT. Baseline:  Goal status: goal met 8/22   3.  Pt will have increase in ankle dorsiflexion to atleast 13 deg for greater ease descending steps  Baseline:  Goal status: revised   4.  Pt will be able to descend clinic steps with improved eccentric control/strength 4+/5 quads without increase in foot/knee pain and min use of UE support Baseline:  Goal status: revised   5.  Pt will report atleast 50% improvement in her Rt foot pain from the start of PT. Baseline:  Goal status: met 8/22  6. Improved squat and ankle balance/stability needed to return to kayaking in the future.   NEW    PLAN: PT FREQUENCY: 2x/week  PT DURATION: 6 weeks  PLANNED INTERVENTIONS: Therapeutic exercises, Therapeutic activity, Neuromuscular re-education, Balance training, Gait training, Patient/Family education, Joint mobilization, Stair training, Aquatic Therapy, Dry Needling, Cryotherapy, Taping, and Manual therapy  PLAN FOR NEXT SESSION:   DC next visit.   Anderson Malta B. Mitra Duling, PT 03/11/22 2:22 PM   [facility] [address] [place], [place] [address] Phone # [phone] Fax [phone]

## 2022-03-16 ENCOUNTER — Ambulatory Visit: Payer: PPO

## 2022-03-16 DIAGNOSIS — M79604 Pain in right leg: Secondary | ICD-10-CM | POA: Diagnosis not present

## 2022-03-16 DIAGNOSIS — M79605 Pain in left leg: Secondary | ICD-10-CM

## 2022-03-16 DIAGNOSIS — M25671 Stiffness of right ankle, not elsewhere classified: Secondary | ICD-10-CM

## 2022-03-16 DIAGNOSIS — M6281 Muscle weakness (generalized): Secondary | ICD-10-CM

## 2022-03-16 DIAGNOSIS — R262 Difficulty in walking, not elsewhere classified: Secondary | ICD-10-CM

## 2022-03-16 NOTE — Therapy (Signed)
OUTPATIENT PHYSICAL THERAPY LOWER EXTREMITY DISCHARGE NOTE   Patient Name: Jamie Glover MRN: [id number] DOB:Feb 14, 1956, 66 y.o., female Today's Date: 03/16/2022   PT End of Session - 03/16/22 1031     Visit Number 23    Date for PT Re-Evaluation 03/16/22    Authorization Type [employer]    Authorization Time Period 12/08/21 to 01/20/22 extended through 10/17    PT Start Time 1018    PT Stop Time 1057    PT Time Calculation (min) 39 min    Activity Tolerance Patient tolerated treatment well    Behavior During Therapy [facility] for tasks assessed/performed                 Past Medical History:  Diagnosis Date   Arthritis    Hypertension    OSA (obstructive sleep apnea) 2013   Past Surgical History:  Procedure Laterality Date   BREAST BIOPSY     c-sections     CHOLECYSTECTOMY     CHOLECYSTECTOMY, LAPAROSCOPIC  1996   right knee surgery     Patient Active Problem List   Diagnosis Date Noted   Peroneal tendonitis, right 09/15/2021   Unilateral primary osteoarthritis, left knee 04/14/2020   Primary osteoarthritis of left knee 01/15/2019   Unilateral primary osteoarthritis, right knee 12/26/2017   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 ear 12/19/2017   Eustachian tube dysfunction, left 12/19/2017   Chronic serous otitis media of left ear 12/19/2017   Tinnitus 10/01/2011   Vitamin D deficiency 10/01/2011   GERD (gastroesophageal reflux disease) 10/01/2011   Neck pain 10/01/2011   HTN (hypertension) 10/01/2011   Sleep apnea 09/29/2011   BMI 33.0-33.9,adult 09/29/2011    PCP: Milagros Evener, MD  REFERRING PROVIDER: Faustino Congress, NP  REFERRING DIAG: Pain in Rt knee, Pain in Rt leg, Pain in Lt leg   THERAPY DIAG:  Pain in right leg  Pain in left leg  Stiffness of right ankle, not elsewhere classified  Muscle weakness (generalized)  Difficulty in walking, not elsewhere  classified  Rationale for Evaluation and Treatment Rehabilitation  ONSET DATE: 2 years ago  SUBJECTIVE:   SUBJECTIVE STATEMENT: Patient states she is doing well.  She got her cups that she ordered but hers did not come with a small one like we use on her ankle so she may have to order another one.        PERTINENT HISTORY:  Arthritis Rt knee, not much improvement with PT  Pt states that about 2 years ago, she was walking and felt a pop in her Rt foot. Some time went by and her foot was feeling a little better. She went on a trip and did a lot of walking which seemed to cause a flare-up in her foot pain. In January, she saw a podiatrist and was put in a boot for 3 weeks and had no improvement. She was sent to PT but was 1 of 4 pts at a time and felt that after 8 weeks she was not seeing much improvement. While at PT, she also noticed her Lt knee/outer leg was bothering her during her sessions. She has been on Mobic the last week and feels that her pain is better than usual because of this.   PAIN:  Are you having pain? Yes: NPRS scale: 1/10 Pain location: right knee and Rt lateral foot 5th toe to heel  Pain description: sore Aggravating factors: walking, heel raises, standing on  1 leg Relieving factors: resting off her feet  PRECAUTIONS: None  WEIGHT BEARING RESTRICTIONS No  FALLS:  Has patient fallen in last 6 months? No  LIVING ENVIRONMENT: Lives with: lives with their family Lives in: House/apartment Stairs: Yes: External: 5 steps;   Has following equipment at home: None  OCCUPATION: retired Marine scientist   PLOF: Independent  PATIENT GOALS be able to walk without increase in Rt and Lt LE pain    OBJECTIVE:   DIAGNOSTIC FINDINGS: MRI Rt foot  PATIENT SURVEYS:  FOTO 55    8/22:  70% FOTO 03/16/22: 65% FOTO  COGNITION:  Overall cognitive status: Within functional limits for tasks assessed     SENSATION: Denies numbness/tingling  EDEMA:  None noticed   POSTURE:   LE adduction, foot eversion  Right knee valgus  PALPATION: Tenderness over lateral aspect of the foot, at peroneal tendon insertion  LOWER EXTREMITY ROM:  Passive ROM Right eval Left eval 8/22 03/16/22 Right  03/16/22 Left  Hip flexion       Hip extension       Hip abduction       Hip adduction       Hip internal rotation       Hip external rotation       Knee flexion   125 bil     Knee extension       Ankle dorsiflexion 0 0 12 right 13 11  Ankle plantarflexion       Ankle inversion 25 (+pain)  30 28 right 30 43  Ankle eversion   38 right  25 48   (Blank rows = not tested)  LOWER EXTREMITY MMT:  MMT Right eval Left eval 8/4 8/22 03/16/22 03/16/22  Hip flexion     5 5  Hip extension 3/5 3/5 4 right/left 4+ 5 5  Hip abduction 3/5 3/5 4 right/left 4+ 5 5  Hip adduction     5 5  Hip internal rotation     5 5  Hip external rotation        Knee flexion 4/5 4/5 4+ right/left 4+ 5 5  Knee extension 5/5 5/5 4 right/ 5 left 4 right  5 5  Ankle dorsiflexion 5/5       Ankle plantarflexion Unable to complete full range heel raise X10 reps    15 reps 15 reps  Ankle inversion 5/_0 Ankle eversion 5/_1 (Blank rows = not tested)  LOWER EXTREMITY SPECIAL TESTS:    FUNCTIONAL TESTS:  8/22 Single leg heel raises 10x proficient 03/16/22:  x 15 on left and right proficiently    GAIT: Distance walked:  Assistive device utilized:  Level of assistance:  Comments: Pt ambulates with decreased glute activation, (+) trendelenburg bilateral, hip adduction, foot eversion  Stairs: (+) trendelenburg, poor eccentric control bilaterally when descending steps    TODAY'S TREATMENT: 03/16/22: DC 10/12: Recumbent bike x 5 min Level 1 (PT present to discuss progress) Multi-hip x 20 each of hip abduction and extension at 40 lbs Lateral band walks with blue loop 3 laps of 10 steps each way Leg Press bilateral x 20 with 70 lbs, then 50 lbs single leg press on each LE x  20 Step up and hold on airex mat x 10 on each LE fwd and lateral Myofascial tissue work at lateral ankle using small suction cup x 10 min along with PROM of right ankle and cross friction  massage to peroneal tendon.    10/4: Recumbent bike x 5 min Level 1 (PT present to discuss progress) Leg Press bilateral x 20 with 65lbs, then 45 lbs single leg press on each LE x 20 Multi-hip x 20 each of hip abduction and extension at 40 lbs Lateral band walks with blue loop 3 laps of 10 steps each way Step up and hold on airex mat x 10 on each LE fwd and lateral Myofascial tissue work at lateral ankle using small suction cup x 10 min along with PROM of right ankle and cross friction massage to peroneal tendon.    9/28: Recumbent bike x 5 min Level 1 (PT present to discuss progress) Multi-hip x 20 each of hip abduction and extension at 40 lbs Lateral band walks with blue loop 3 laps of 10 steps each way Step up and hold on airex mat x 10 on each LE fwd and lateral Leg Press bilateral x 20 with 60lbs, then 40 lbs single leg press on each LE x 20 Myofascial tissue work at lateral ankle using hawks grips.  (IASTM) x 10 min along with PROM of right ankle and cross friction massage to peroneal tendon.    PATIENT EDUCATION:  Education details: eval findings/POC; HEP Person educated: Patient Education method: Explanation Education comprehension: returned demonstration   HOME EXERCISE PROGRAM: Access Code: [email or web address] URL: [email or web address] Date: 01/19/2022 Prepared by: Ruben Im  Exercises - Supine Bridge with Resistance Band  - 1 x daily - 7 x weekly - 2-3 sets - 10 reps - Sidelying Hip Abduction  - 1 x daily - 7 x weekly - 1-2 sets - 10 reps - Supine Piriformis Stretch with Foot on Ground  - 1 x daily - 7 x weekly - 2 sets - 30 sec hold - Standing Knee Flexion Stretch on Step  - 1 x daily - 7 x weekly - 1 sets - 10 reps - Standing Isometric Hip Abduction with Ball on Wall  - 1 x  daily - 7 x weekly - 1 sets - 5 reps - 5 hold - Long Sitting Isometric Ankle Inversion in Dorsiflexion with Ball at [place]  - 1 x daily - 7 x weekly - 1 sets - 10 reps - 5 hold - Long Sitting Isometric Ankle Eversion in Dorsiflexion with Ball at [place]  - 1 x daily - 7 x weekly - 3 sets - 10 reps - 5 hold - Sit to Stand  - 1 x daily - 7 x weekly - 1 sets - 10 reps - Seated Hip Abduction with Resistance  - 1 x daily - 7 x weekly - 1 sets - 20 reps - Seated Hamstring Curls with Resistance  - 1 x daily - 7 x weekly - 2 sets - 10 reps - Seated Long Arc Quad with Ankle Weight  - 1 x daily - 7 x weekly - 2 sets - 10 reps - Standing Hip Abduction with Counter Support  - 1 x daily - 7 x weekly - 2 sets - 10 reps - Supine Knee Extension Strengthening  - 1 x daily - 7 x weekly - 2 sets - 10 reps - Forward Step Down Touch with Toe  - 1 x daily - 7 x weekly - 1 sets - 10 reps - Seated Quad Set  - 1 x daily - 7 x weekly - 1 sets - 10 reps - 5 hold - Side Stepping with Resistance at Feet  - 1 x daily -  7 x weekly - 1 sets - 5 reps - Standing Gastroc Stretch at Counter  - 1 x daily - 7 x weekly - 1 sets - 2-3 reps - 30 hold - Standing Soleus Stretch on Foam 1/2 Roll  - 1 x daily - 7 x weekly - 1 sets - 2-3 reps - 30 hold  CLINICAL IMPRESSION: Tesneem has met all goals and is progressing very well at this point.  She has improved on all functional activities including descending steps.  Initially she had to do side step approach and avoiding steps all together.  She is now able to ascend and descend stairs with normal reciprocal gait.  She continues to have slight valgus knee position but denies any pain in right knee for the past several weeks.  Right ankle is occasionally problematic but she is able to manage this with proper foot wear and ankle stability exercises.  She is very diligent with her HEP and is set up to begin personal training next week.  She should continue to do well.  We will DC at this time.      OBJECTIVE IMPAIRMENTS Abnormal gait, decreased activity tolerance, decreased balance, decreased endurance, decreased mobility, difficulty walking, decreased ROM, decreased strength, hypomobility, increased muscle spasms, impaired flexibility, improper body mechanics, postural dysfunction, and pain.   ACTIVITY LIMITATIONS standing, stairs, and locomotion level  PARTICIPATION LIMITATIONS: community activity  PERSONAL FACTORS Age and Time since onset of injury/illness/exacerbation are also affecting patient's functional outcome.   REHAB POTENTIAL: Good  CLINICAL DECISION MAKING: Evolving/moderate complexity  EVALUATION COMPLEXITY: Moderate   GOALS: Goals reviewed with patient? Yes  SHORT TERM GOALS: Target date: 12/24/2021  Pt will be independent with her initial HEP to improve LE strength and flexibility.  Baseline: Goal status: met 8/4  LONG TERM GOALS: Target date:03/16/22   Pt will have improved Rt gastroc strength, evident by her ability to complete 15 single leg heel raises proficiently.  Baseline:  Goal status: MET 03/16/22  2.  Pt will have improved hip strength to 4/5 MMT. Baseline:  Goal status: goal met 8/22   3.  Pt will have increase in ankle dorsiflexion to atleast 13 deg for greater ease descending steps  Baseline:  Goal status: MET   4.  Pt will be able to descend clinic steps with improved eccentric control/strength 4+/5 quads without increase in foot/knee pain and min use of UE support Baseline:  Goal status: MET 03/16/22  5.  Pt will report atleast 50% improvement in her Rt foot pain from the start of PT. Baseline:  Goal status: met 8/22  6. Improved squat and ankle balance/stability needed to return to kayaking in the future.   NEW  MET 03/16/22    PLAN: PT FREQUENCY: 2x/week  PT DURATION: 6 weeks  PLANNED INTERVENTIONS: Therapeutic exercises, Therapeutic activity, Neuromuscular re-education, Balance training, Gait training, Patient/Family  education, Joint mobilization, Stair training, Aquatic Therapy, Dry Needling, Cryotherapy, Taping, and Manual therapy  PLAN FOR NEXT SESSION:   DC next visit.  PHYSICAL THERAPY DISCHARGE SUMMARY  Visits from Start of Care: 23  Current functional level related to goals / functional outcomes: See above   Remaining deficits: See above   Education / Equipment: See above   Patient agrees to discharge. Patient goals were met. Patient is being discharged due to meeting the stated rehab goals.   Anderson Malta B. Josue Kass, PT 03/16/22 11:03 AM   [facility] [address] [place], [place] [address] Phone #  [phone] Fax [phone]

## 2022-04-12 DIAGNOSIS — E669 Obesity, unspecified: Secondary | ICD-10-CM | POA: Diagnosis not present

## 2022-04-12 DIAGNOSIS — E782 Mixed hyperlipidemia: Secondary | ICD-10-CM | POA: Diagnosis not present

## 2022-04-12 DIAGNOSIS — Z6835 Body mass index (BMI) 35.0-35.9, adult: Secondary | ICD-10-CM | POA: Diagnosis not present

## 2022-04-12 DIAGNOSIS — R7301 Impaired fasting glucose: Secondary | ICD-10-CM | POA: Diagnosis not present

## 2022-05-04 DIAGNOSIS — E782 Mixed hyperlipidemia: Secondary | ICD-10-CM | POA: Diagnosis not present

## 2022-06-15 DIAGNOSIS — L821 Other seborrheic keratosis: Secondary | ICD-10-CM | POA: Diagnosis not present

## 2022-06-15 DIAGNOSIS — D2261 Melanocytic nevi of right upper limb, including shoulder: Secondary | ICD-10-CM | POA: Diagnosis not present

## 2022-06-15 DIAGNOSIS — D1801 Hemangioma of skin and subcutaneous tissue: Secondary | ICD-10-CM | POA: Diagnosis not present

## 2022-06-15 DIAGNOSIS — L603 Nail dystrophy: Secondary | ICD-10-CM | POA: Diagnosis not present

## 2022-06-15 DIAGNOSIS — D2372 Other benign neoplasm of skin of left lower limb, including hip: Secondary | ICD-10-CM | POA: Diagnosis not present

## 2022-06-15 DIAGNOSIS — L308 Other specified dermatitis: Secondary | ICD-10-CM | POA: Diagnosis not present

## 2022-06-15 DIAGNOSIS — D225 Melanocytic nevi of trunk: Secondary | ICD-10-CM | POA: Diagnosis not present

## 2022-06-15 DIAGNOSIS — L814 Other melanin hyperpigmentation: Secondary | ICD-10-CM | POA: Diagnosis not present

## 2022-06-15 DIAGNOSIS — R208 Other disturbances of skin sensation: Secondary | ICD-10-CM | POA: Diagnosis not present

## 2022-07-13 DIAGNOSIS — H43813 Vitreous degeneration, bilateral: Secondary | ICD-10-CM | POA: Diagnosis not present

## 2022-07-13 DIAGNOSIS — H5203 Hypermetropia, bilateral: Secondary | ICD-10-CM | POA: Diagnosis not present

## 2022-07-13 DIAGNOSIS — H524 Presbyopia: Secondary | ICD-10-CM | POA: Diagnosis not present

## 2022-07-13 DIAGNOSIS — H52203 Unspecified astigmatism, bilateral: Secondary | ICD-10-CM | POA: Diagnosis not present

## 2022-07-13 DIAGNOSIS — H2513 Age-related nuclear cataract, bilateral: Secondary | ICD-10-CM | POA: Diagnosis not present

## 2022-07-19 ENCOUNTER — Other Ambulatory Visit: Payer: Self-pay | Admitting: Family Medicine

## 2022-07-19 DIAGNOSIS — Z1231 Encounter for screening mammogram for malignant neoplasm of breast: Secondary | ICD-10-CM

## 2022-09-13 ENCOUNTER — Ambulatory Visit: Payer: PPO

## 2022-10-05 ENCOUNTER — Ambulatory Visit
Admission: RE | Admit: 2022-10-05 | Discharge: 2022-10-05 | Disposition: A | Discharge status: Home / Self Care | Payer: PPO | Source: Ambulatory Visit | Attending: Family Medicine | Admitting: Family Medicine

## 2022-10-05 DIAGNOSIS — Z1231 Encounter for screening mammogram for malignant neoplasm of breast: Secondary | ICD-10-CM

## 2022-10-28 DIAGNOSIS — S50812A Abrasion of left forearm, initial encounter: Secondary | ICD-10-CM | POA: Diagnosis not present

## 2022-10-28 DIAGNOSIS — W540XXA Bitten by dog, initial encounter: Secondary | ICD-10-CM | POA: Diagnosis not present

## 2022-10-28 DIAGNOSIS — I10 Essential (primary) hypertension: Secondary | ICD-10-CM | POA: Diagnosis not present

## 2022-10-28 DIAGNOSIS — A499 Bacterial infection, unspecified: Secondary | ICD-10-CM | POA: Diagnosis not present

## 2022-11-11 DIAGNOSIS — F32A Depression, unspecified: Secondary | ICD-10-CM | POA: Diagnosis not present

## 2022-11-11 DIAGNOSIS — F419 Anxiety disorder, unspecified: Secondary | ICD-10-CM | POA: Diagnosis not present

## 2022-11-18 DIAGNOSIS — F32A Depression, unspecified: Secondary | ICD-10-CM | POA: Diagnosis not present

## 2022-11-18 DIAGNOSIS — F419 Anxiety disorder, unspecified: Secondary | ICD-10-CM | POA: Diagnosis not present

## 2022-11-25 DIAGNOSIS — F419 Anxiety disorder, unspecified: Secondary | ICD-10-CM | POA: Diagnosis not present

## 2022-11-25 DIAGNOSIS — F32A Depression, unspecified: Secondary | ICD-10-CM | POA: Diagnosis not present

## 2022-12-13 DIAGNOSIS — E6609 Other obesity due to excess calories: Secondary | ICD-10-CM | POA: Diagnosis not present

## 2022-12-13 DIAGNOSIS — I10 Essential (primary) hypertension: Secondary | ICD-10-CM | POA: Diagnosis not present

## 2022-12-13 DIAGNOSIS — Z6831 Body mass index (BMI) 31.0-31.9, adult: Secondary | ICD-10-CM | POA: Diagnosis not present

## 2022-12-13 DIAGNOSIS — H8113 Benign paroxysmal vertigo, bilateral: Secondary | ICD-10-CM | POA: Diagnosis not present

## 2022-12-13 DIAGNOSIS — E2839 Other primary ovarian failure: Secondary | ICD-10-CM | POA: Diagnosis not present

## 2022-12-13 DIAGNOSIS — E782 Mixed hyperlipidemia: Secondary | ICD-10-CM | POA: Diagnosis not present

## 2022-12-13 DIAGNOSIS — R7301 Impaired fasting glucose: Secondary | ICD-10-CM | POA: Diagnosis not present

## 2022-12-13 DIAGNOSIS — M25571 Pain in right ankle and joints of right foot: Secondary | ICD-10-CM | POA: Diagnosis not present

## 2022-12-13 DIAGNOSIS — G8929 Other chronic pain: Secondary | ICD-10-CM | POA: Diagnosis not present

## 2022-12-13 DIAGNOSIS — E559 Vitamin D deficiency, unspecified: Secondary | ICD-10-CM | POA: Diagnosis not present

## 2022-12-13 DIAGNOSIS — Z0000 Encounter for general adult medical examination without abnormal findings: Secondary | ICD-10-CM | POA: Diagnosis not present

## 2022-12-13 DIAGNOSIS — G4733 Obstructive sleep apnea (adult) (pediatric): Secondary | ICD-10-CM | POA: Diagnosis not present

## 2022-12-17 DIAGNOSIS — E782 Mixed hyperlipidemia: Secondary | ICD-10-CM | POA: Diagnosis not present

## 2022-12-17 DIAGNOSIS — I10 Essential (primary) hypertension: Secondary | ICD-10-CM | POA: Diagnosis not present

## 2022-12-17 DIAGNOSIS — Z0000 Encounter for general adult medical examination without abnormal findings: Secondary | ICD-10-CM | POA: Diagnosis not present

## 2022-12-17 DIAGNOSIS — E559 Vitamin D deficiency, unspecified: Secondary | ICD-10-CM | POA: Diagnosis not present

## 2022-12-23 ENCOUNTER — Ambulatory Visit: Payer: PPO | Attending: Family Medicine

## 2022-12-23 ENCOUNTER — Other Ambulatory Visit: Payer: Self-pay

## 2022-12-23 DIAGNOSIS — M25571 Pain in right ankle and joints of right foot: Secondary | ICD-10-CM | POA: Insufficient documentation

## 2022-12-23 DIAGNOSIS — R252 Cramp and spasm: Secondary | ICD-10-CM | POA: Diagnosis not present

## 2022-12-23 DIAGNOSIS — M25671 Stiffness of right ankle, not elsewhere classified: Secondary | ICD-10-CM | POA: Insufficient documentation

## 2022-12-23 DIAGNOSIS — M6281 Muscle weakness (generalized): Secondary | ICD-10-CM | POA: Diagnosis not present

## 2022-12-23 DIAGNOSIS — R262 Difficulty in walking, not elsewhere classified: Secondary | ICD-10-CM | POA: Diagnosis not present

## 2022-12-23 NOTE — Therapy (Signed)
OUTPATIENT PHYSICAL THERAPY LOWER EXTREMITY EVALUATION   Patient Name: Jamie Glover MRN: [id number] DOB:[birth year], 67 y.o., female Today's Date: 12/23/2022  END OF SESSION:  PT End of Session - 12/23/22 0803     Visit Number 1    Date for PT Re-Evaluation 02/17/23    Authorization Type [employer]    Progress Note Due on Visit 10    PT Start Time 0800    PT Stop Time 0847    PT Time Calculation (min) 47 min    Activity Tolerance Patient tolerated treatment well    Behavior During Therapy [facility] for tasks assessed/performed             Past Medical History:  Diagnosis Date   Arthritis    Hypertension    OSA (obstructive sleep apnea) 2013   Past Surgical History:  Procedure Laterality Date   BREAST BIOPSY Right    c-sections     CHOLECYSTECTOMY     CHOLECYSTECTOMY, LAPAROSCOPIC  1996   right knee surgery     Patient Active Problem List   Diagnosis Date Noted   Peroneal tendonitis, right 09/15/2021   Unilateral primary osteoarthritis, left knee 04/14/2020   Primary osteoarthritis of left knee 01/15/2019   Unilateral primary osteoarthritis, right knee 12/26/2017   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 ear 12/19/2017   Eustachian tube dysfunction, left 12/19/2017   Chronic serous otitis media of left ear 12/19/2017   Tinnitus 10/01/2011   Vitamin D deficiency 10/01/2011   GERD (gastroesophageal reflux disease) 10/01/2011   Neck pain 10/01/2011   HTN (hypertension) 10/01/2011   Sleep apnea 09/29/2011   BMI 33.0-33.9,adult 09/29/2011    PCP: Clayborn Heron, MD   REFERRING PROVIDER: Inez Pilgrim, NP  REFERRING DIAG: M25.571 (ICD-10-CM) - Pain in right ankle and joints of right foot  THERAPY DIAG:  Pain in right ankle and joints of right foot - Plan: PT plan of care cert/re-cert  Stiffness of right ankle, not elsewhere classified - Plan: PT plan of care cert/re-cert  Difficulty in  walking, not elsewhere classified - Plan: PT plan of care cert/re-cert  Muscle weakness (generalized) - Plan: PT plan of care cert/re-cert  Cramp and spasm - Plan: PT plan of care cert/re-cert  Rationale for Evaluation and Treatment: Rehabilitation  ONSET DATE: 12/16/2022  SUBJECTIVE:   SUBJECTIVE STATEMENT: Patient states she has been doing well since last episode but began having pain in the lateral ankle.  "It's in a different spot. It was previously on top of my foot and now its on the outside of my ankle".  (She locates the peroneal tendon area.  She is retired, a new grandmother and has lost 35 lbs since her last episode of PT.  She explains that she is more active and having no knee pain.  She is hoping to avoid the ankle pain becoming more limiting.  She is having to modify a lot of poses during yoga class as well as have pain that wakes her at night.   PERTINENT HISTORY: na PAIN:  Are you having pain?  Minimal at the moment but I am aware of it. 2/10 at its worst in past 24 hours, and 4/10 at worst in the past few days.   PRECAUTIONS: None  RED FLAGS: None   WEIGHT BEARING RESTRICTIONS: No  FALLS:  Has patient fallen in last 6 months? No  LIVING ENVIRONMENT: Lives with: lives with their family Lives in: House/apartment  OCCUPATION: Retired  PLOF: Independent  PATIENT GOALS: Not having to avoid stances in yoga and to avoid waking up at night  NEXT MD VISIT: prn  OBJECTIVE:   DIAGNOSTIC FINDINGS: na  PATIENT SURVEYS:  FOTO 66, predicted 64  COGNITION: Overall cognitive status: Within functional limits for tasks assessed     SENSATION: WFL  POSTURE:  slight knee valgus  PALPATION: Tender to palpation over the peroneal tendon   LOWER EXTREMITY ROM:  Active ROM Right eval Left eval  Ankle dorsiflexion 10 8  Ankle plantarflexion WNL WNL  Ankle inversion 34 42  Ankle eversion 16 36   (Blank rows = not tested)  LOWER EXTREMITY MMT:  MMT  Right eval Left eval  Ankle dorsiflexion 5 5  Ankle plantarflexion 5 5  Ankle inversion 5 5  Ankle eversion 5 5   (Blank rows = not tested)  LOWER EXTREMITY SPECIAL TESTS:  Ankle special tests: Dorsiflexion-Eversion test: positive   FUNCTIONAL TESTS:  5 times sit to stand: 10.56 sec Timed up and go (TUG): 8.00 sec  GAIT: Distance walked: 50 Assistive device utilized: None Level of assistance: Complete Independence Comments: normal heel to toe progression   TODAY'S TREATMENT:                                                                                                                              DATE: 7/25    PATIENT EDUCATION:  Education details: Initiated HEP Person educated: Patient Education method: Programmer, multimedia, Facilities manager, Verbal cues, and Handouts Education comprehension: verbalized understanding, returned demonstration, and verbal cues required  HOME EXERCISE PROGRAM: Access Code: [email or web address] URL: [email or web address] Date: 12/23/2022 Prepared by: Mikey Kirschner  Exercises - Seated Ankle Inversion Eversion PROM  - 1 x daily - 7 x weekly - 3 sets - 10 reps - Single Leg Stance  - 1 x daily - 7 x weekly - 3 sets - 10 reps - Lateral Ankle Scar Massage  - 1 x daily - 7 x weekly - 1 sets - 1 reps - 2 min hold  ASSESSMENT:  CLINICAL IMPRESSION: Patient is a 67 y.o. female who was seen today for physical therapy evaluation and treatment for right ankle pain.  The pain is in a different area than last episode.  Her previous pain was on the dorsum of the foot.  Her current pain is in the area of the peroneal tendon, just distal to the lateral malleolus.  She has no functional limitations but she does have to modify yoga positions and the pain wakes her at night.  She continues to have pes planus but wears corrective footwear.  She has lost 35 lbs since her last episode of PT.  She would benefit from skilled PT for ankle mobility, strengthening, and  stability training.    OBJECTIVE IMPAIRMENTS: difficulty walking, decreased ROM, increased fascial restrictions, increased muscle spasms, impaired flexibility, and pain.   ACTIVITY LIMITATIONS: standing, squatting, and sleeping  PARTICIPATION LIMITATIONS: meal  prep, cleaning, laundry, shopping, community activity, yard work, and church  PERSONAL FACTORS: 1-2 comorbidities: OA, Htn  are also affecting patient's functional outcome.   REHAB POTENTIAL: Good  CLINICAL DECISION MAKING: Stable/uncomplicated  EVALUATION COMPLEXITY: Low   GOALS: Goals reviewed with patient? Yes  SHORT TERM GOALS: Target date: 01/20/2023   Pain report to be no greater than 3/10  Baseline: Goal status: INITIAL  2.  Patient will be independent with initial HEP  Baseline:  Goal status: INITIAL  LONG TERM GOALS: Target date: 02/17/2023   Patient to report pain no greater than 1/10  Baseline:  Goal status: INITIAL  2.  Patient to be independent with advanced HEP  Baseline:  Goal status: INITIAL  3.  Patient able to maintain SLS x 20 sec on floor Baseline:  Goal status: INITIAL  4.  Patient to be able to sleep through the night  Baseline:  Goal status: INITIAL  5.  Patient to be able to do all yoga poses without modification Baseline:  Goal status: INITIAL  6.  FOTO score to be 73 Baseline: 66 Goal status: INITIAL   PLAN:  PT FREQUENCY: 1x/week  PT DURATION: 8 weeks  PLANNED INTERVENTIONS: Therapeutic exercises, Therapeutic activity, Neuromuscular re-education, Balance training, Gait training, Patient/Family education, Self Care, Joint mobilization, Stair training, Orthotic/Fit training, Aquatic Therapy, Dry Needling, Electrical stimulation, Cryotherapy, Moist heat, Compression bandaging, Splintting, Taping, Vasopneumatic device, Ultrasound, Ionotophoresis 4mg /ml Dexamethasone, Manual therapy, and Re-evaluation  PLAN FOR NEXT SESSION: Review HEP,  DN to peroneals, STM, cross friction,  possible IASTM, short foot, ankle stability training.  Ice or game ready if needed.     Victorino Dike B. Bailee Metter, PT 12/23/22 9:16 AM [facility] [address] [place], [place] [address] Phone # [phone] Fax [phone]

## 2022-12-24 DIAGNOSIS — J309 Allergic rhinitis, unspecified: Secondary | ICD-10-CM | POA: Diagnosis not present

## 2022-12-24 DIAGNOSIS — I10 Essential (primary) hypertension: Secondary | ICD-10-CM | POA: Diagnosis not present

## 2022-12-24 DIAGNOSIS — M1990 Unspecified osteoarthritis, unspecified site: Secondary | ICD-10-CM | POA: Diagnosis not present

## 2022-12-24 DIAGNOSIS — E559 Vitamin D deficiency, unspecified: Secondary | ICD-10-CM | POA: Diagnosis not present

## 2022-12-24 DIAGNOSIS — E7800 Pure hypercholesterolemia, unspecified: Secondary | ICD-10-CM | POA: Diagnosis not present

## 2022-12-24 DIAGNOSIS — H259 Unspecified age-related cataract: Secondary | ICD-10-CM | POA: Diagnosis not present

## 2022-12-24 DIAGNOSIS — E669 Obesity, unspecified: Secondary | ICD-10-CM | POA: Diagnosis not present

## 2023-01-03 DIAGNOSIS — E669 Obesity, unspecified: Secondary | ICD-10-CM | POA: Diagnosis not present

## 2023-01-03 DIAGNOSIS — E782 Mixed hyperlipidemia: Secondary | ICD-10-CM | POA: Diagnosis not present

## 2023-01-03 DIAGNOSIS — I10 Essential (primary) hypertension: Secondary | ICD-10-CM | POA: Diagnosis not present

## 2023-01-03 DIAGNOSIS — Z6831 Body mass index (BMI) 31.0-31.9, adult: Secondary | ICD-10-CM | POA: Diagnosis not present

## 2023-01-05 ENCOUNTER — Ambulatory Visit: Payer: PPO | Attending: Family Medicine

## 2023-01-05 DIAGNOSIS — M79605 Pain in left leg: Secondary | ICD-10-CM | POA: Insufficient documentation

## 2023-01-05 DIAGNOSIS — R262 Difficulty in walking, not elsewhere classified: Secondary | ICD-10-CM | POA: Insufficient documentation

## 2023-01-05 DIAGNOSIS — M25571 Pain in right ankle and joints of right foot: Secondary | ICD-10-CM | POA: Diagnosis not present

## 2023-01-05 DIAGNOSIS — M25671 Stiffness of right ankle, not elsewhere classified: Secondary | ICD-10-CM | POA: Diagnosis not present

## 2023-01-05 DIAGNOSIS — R252 Cramp and spasm: Secondary | ICD-10-CM

## 2023-01-05 DIAGNOSIS — M6281 Muscle weakness (generalized): Secondary | ICD-10-CM | POA: Diagnosis not present

## 2023-01-05 DIAGNOSIS — M79604 Pain in right leg: Secondary | ICD-10-CM | POA: Diagnosis not present

## 2023-01-05 NOTE — Therapy (Signed)
OUTPATIENT PHYSICAL THERAPY LOWER EXTREMITY TREATMENT NOTE   Patient Name: Jamie Glover MRN: [id number] DOB:[birth year], 67 y.o., female Today's Date: 01/05/2023  END OF SESSION:  PT End of Session - 01/05/23 0801     Visit Number 2    Date for PT Re-Evaluation 02/17/23    Authorization Type [employer]    Progress Note Due on Visit 10    PT Start Time 0803    PT Stop Time 0832    PT Time Calculation (min) 29 min    Activity Tolerance Patient tolerated treatment well    Behavior During Therapy [facility] for tasks assessed/performed             Past Medical History:  Diagnosis Date   Arthritis    Hypertension    OSA (obstructive sleep apnea) 2013   Past Surgical History:  Procedure Laterality Date   BREAST BIOPSY Right    c-sections     CHOLECYSTECTOMY     CHOLECYSTECTOMY, LAPAROSCOPIC  1996   right knee surgery     Patient Active Problem List   Diagnosis Date Noted   Peroneal tendonitis, right 09/15/2021   Unilateral primary osteoarthritis, left knee 04/14/2020   Primary osteoarthritis of left knee 01/15/2019   Unilateral primary osteoarthritis, right knee 12/26/2017   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 ear 12/19/2017   Eustachian tube dysfunction, left 12/19/2017   Chronic serous otitis media of left ear 12/19/2017   Tinnitus 10/01/2011   Vitamin D deficiency 10/01/2011   GERD (gastroesophageal reflux disease) 10/01/2011   Neck pain 10/01/2011   HTN (hypertension) 10/01/2011   Sleep apnea 09/29/2011   BMI 33.0-33.9,adult 09/29/2011    PCP: Clayborn Heron, MD   REFERRING PROVIDER: Inez Pilgrim, NP  REFERRING DIAG: M25.571 (ICD-10-CM) - Pain in right ankle and joints of right foot  THERAPY DIAG:  Pain in right ankle and joints of right foot  Stiffness of right ankle, not elsewhere classified  Difficulty in walking, not elsewhere classified  Muscle weakness (generalized)  Cramp  and spasm  Pain in right leg  Pain in left leg  Rationale for Evaluation and Treatment: Rehabilitation  ONSET DATE: 12/16/2022  SUBJECTIVE:   SUBJECTIVE STATEMENT: Patient states the ankle is better but still having some discomfort.  She reports this at 1-2/10 today.   PERTINENT HISTORY: na PAIN:  Are you having pain?  Minimal at the moment but I am aware of it. 2/10 at its worst in past 24 hours, and 4/10 at worst in the past few days.   PRECAUTIONS: None  RED FLAGS: None   WEIGHT BEARING RESTRICTIONS: No  FALLS:  Has patient fallen in last 6 months? No  LIVING ENVIRONMENT: Lives with: lives with their family Lives in: House/apartment   OCCUPATION: Retired  PLOF: Independent  PATIENT GOALS: Not having to avoid stances in yoga and to avoid waking up at night  NEXT MD VISIT: prn  OBJECTIVE:   DIAGNOSTIC FINDINGS: na  PATIENT SURVEYS:  FOTO 66, predicted 1  COGNITION: Overall cognitive status: Within functional limits for tasks assessed     SENSATION: WFL  POSTURE:  slight knee valgus  PALPATION: Tender to palpation over the peroneal tendon   LOWER EXTREMITY ROM:  Active ROM Right eval Left eval  Ankle dorsiflexion 10 8  Ankle plantarflexion WNL WNL  Ankle inversion 34 42  Ankle eversion 16 36   (Blank rows = not tested)  LOWER EXTREMITY MMT:  MMT Right eval Left eval  Ankle dorsiflexion 5 5  Ankle plantarflexion 5 5  Ankle inversion 5 5  Ankle eversion 5 5   (Blank rows = not tested)  LOWER EXTREMITY SPECIAL TESTS:  Ankle special tests: Dorsiflexion-Eversion test: positive   FUNCTIONAL TESTS:  5 times sit to stand: 10.56 sec Timed up and go (TUG): 8.00 sec  GAIT: Distance walked: 50 Assistive device utilized: None Level of assistance: Complete Independence Comments: normal heel to toe progression   TODAY'S TREATMENT:                                                                                                                               DATE: 8/7 Seated toe and heel raises x 20 each Ankle alphabet A-Z Rocker board x 2 min Tband ankle DF, inversion and eversion x 20 each Manual: PROM right ankle DF, PF , INV, EV Manual: cupping to right peroneals, cross friction massage to peroneal tendon Manual: forefoot joint mobs  DATE: 7/25  Initial eval completed  Inititated HEP   PATIENT EDUCATION:  Education details: Initiated HEP Person educated: Patient Education method: Programmer, multimedia, Facilities manager, Verbal cues, and Handouts Education comprehension: verbalized understanding, returned demonstration, and verbal cues required  HOME EXERCISE PROGRAM: Access Code: [email or web address] URL: [email or web address] Date: 12/23/2022 Prepared by: Mikey Kirschner  Exercises - Seated Ankle Inversion Eversion PROM  - 1 x daily - 7 x weekly - 3 sets - 10 reps - Single Leg Stance  - 1 x daily - 7 x weekly - 3 sets - 10 reps - Lateral Ankle Scar Massage  - 1 x daily - 7 x weekly - 1 sets - 1 reps - 2 min hold  ASSESSMENT:  CLINICAL IMPRESSION: Agusta experienced improvement after initial visit with instructions to stretch specifically into inversion and doing cross friction massage to peroneals.  She continues to have some mild rigidity at the distal peroneals and is slightly limited in eversion as well.  We will continue to address these issues with skilled PT.      OBJECTIVE IMPAIRMENTS: difficulty walking, decreased ROM, increased fascial restrictions, increased muscle spasms, impaired flexibility, and pain.   ACTIVITY LIMITATIONS: standing, squatting, and sleeping  PARTICIPATION LIMITATIONS: meal prep, cleaning, laundry, shopping, community activity, yard work, and church  PERSONAL FACTORS: 1-2 comorbidities: OA, Htn  are also affecting patient's functional outcome.   REHAB POTENTIAL: Good  CLINICAL DECISION MAKING: Stable/uncomplicated  EVALUATION COMPLEXITY: Low   GOALS: Goals reviewed with  patient? Yes  SHORT TERM GOALS: Target date: 01/20/2023   Pain report to be no greater than 3/10  Baseline: Goal status: MET 01/05/23  2.  Patient will be independent with initial HEP  Baseline:  Goal status: MET 01/05/23  LONG TERM GOALS: Target date: 02/17/2023   Patient to report pain no greater than 1/10  Baseline:  Goal status: INITIAL  2.  Patient to be independent with advanced HEP  Baseline:  Goal status: INITIAL  3.  Patient able to maintain SLS x 20 sec on floor Baseline:  Goal status: INITIAL  4.  Patient to be able to sleep through the night  Baseline:  Goal status: INITIAL  5.  Patient to be able to do all yoga poses without modification Baseline:  Goal status: INITIAL  6.  FOTO score to be 73 Baseline: 66 Goal status: INITIAL   PLAN:  PT FREQUENCY: 1x/week  PT DURATION: 8 weeks  PLANNED INTERVENTIONS: Therapeutic exercises, Therapeutic activity, Neuromuscular re-education, Balance training, Gait training, Patient/Family education, Self Care, Joint mobilization, Stair training, Orthotic/Fit training, Aquatic Therapy, Dry Needling, Electrical stimulation, Cryotherapy, Moist heat, Compression bandaging, Splintting, Taping, Vasopneumatic device, Ultrasound, Ionotophoresis 4mg /ml Dexamethasone, Manual therapy, and Re-evaluation  PLAN FOR NEXT SESSION: STM, cross friction, possible IASTM, short foot, ankle stability training.  Ice or game ready if needed.  DN if needed.    Victorino Dike B. Mariyam Remington, PT 01/05/23 8:46 AM [facility] [address] [place], [place] [address] Phone # [phone] Fax [phone]

## 2023-01-06 ENCOUNTER — Ambulatory Visit: Payer: PPO

## 2023-01-13 ENCOUNTER — Ambulatory Visit: Payer: PPO

## 2023-01-13 DIAGNOSIS — R262 Difficulty in walking, not elsewhere classified: Secondary | ICD-10-CM

## 2023-01-13 DIAGNOSIS — M25671 Stiffness of right ankle, not elsewhere classified: Secondary | ICD-10-CM

## 2023-01-13 DIAGNOSIS — M25571 Pain in right ankle and joints of right foot: Secondary | ICD-10-CM | POA: Diagnosis not present

## 2023-01-13 DIAGNOSIS — R252 Cramp and spasm: Secondary | ICD-10-CM

## 2023-01-13 DIAGNOSIS — M79605 Pain in left leg: Secondary | ICD-10-CM

## 2023-01-13 DIAGNOSIS — M79604 Pain in right leg: Secondary | ICD-10-CM

## 2023-01-13 DIAGNOSIS — M6281 Muscle weakness (generalized): Secondary | ICD-10-CM

## 2023-01-13 NOTE — Therapy (Signed)
OUTPATIENT PHYSICAL THERAPY LOWER EXTREMITY TREATMENT NOTE   Patient Name: Jamie Glover MRN: [id number] DOB:[birth year], 67 y.o., female Today's Date: 01/13/2023  END OF SESSION:  PT End of Session - 01/13/23 1405     Visit Number 3    Date for PT Re-Evaluation 02/17/23    Authorization Type [employer]    Progress Note Due on Visit 10    PT Start Time 1405    PT Stop Time 1445    PT Time Calculation (min) 40 min    Activity Tolerance Patient tolerated treatment well    Behavior During Therapy WFL for tasks assessed/performed             Past Medical History:  Diagnosis Date   Arthritis    Hypertension    OSA (obstructive sleep apnea) 2013   Past Surgical History:  Procedure Laterality Date   BREAST BIOPSY Right    c-sections     CHOLECYSTECTOMY     CHOLECYSTECTOMY, LAPAROSCOPIC  1996   right knee surgery     Patient Active Problem List   Diagnosis Date Noted   Peroneal tendonitis, right 09/15/2021   Unilateral primary osteoarthritis, left knee 04/14/2020   Primary osteoarthritis of left knee 01/15/2019   Unilateral primary osteoarthritis, right knee 12/26/2017   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 ear 12/19/2017   Eustachian tube dysfunction, left 12/19/2017   Chronic serous otitis media of left ear 12/19/2017   Tinnitus 10/01/2011   Vitamin D deficiency 10/01/2011   GERD (gastroesophageal reflux disease) 10/01/2011   Neck pain 10/01/2011   HTN (hypertension) 10/01/2011   Sleep apnea 09/29/2011   BMI 33.0-33.9,adult 09/29/2011    PCP: Clayborn Heron, MD   REFERRING PROVIDER: Inez Pilgrim, NP  REFERRING DIAG: M25.571 (ICD-10-CM) - Pain in right ankle and joints of right foot  THERAPY DIAG:  Pain in right ankle and joints of right foot  Stiffness of right ankle, not elsewhere classified  Difficulty in walking, not elsewhere classified  Muscle weakness (generalized)  Cramp  and spasm  Pain in right leg  Pain in left leg  Rationale for Evaluation and Treatment: Rehabilitation  ONSET DATE: 12/16/2022  SUBJECTIVE:   SUBJECTIVE STATEMENT: Patient states the ankle held up pretty good on vacation but she did a lot of driving and got stuck in traffic two times for a long period having to use gas and break on and off.  She also had some pain with walking on the beach.  Overall, much better than when she started therapy last year because she wasn't able to walk on the beach last year.     PERTINENT HISTORY: na PAIN:  Are you having pain?  Minimal at the moment but I am aware of it. 1-2/10 at its worst in past 24 hours  PRECAUTIONS: None  RED FLAGS: None   WEIGHT BEARING RESTRICTIONS: No  FALLS:  Has patient fallen in last 6 months? No  LIVING ENVIRONMENT: Lives with: lives with their family Lives in: House/apartment   OCCUPATION: Retired  PLOF: Independent  PATIENT GOALS: Not having to avoid stances in yoga and to avoid waking up at night  NEXT MD VISIT: prn  OBJECTIVE:   DIAGNOSTIC FINDINGS: na  PATIENT SURVEYS:  FOTO 4, predicted 21  COGNITION: Overall cognitive status: Within functional limits for tasks assessed     SENSATION: WFL  POSTURE:  slight knee valgus  PALPATION: Tender to palpation over the peroneal tendon  LOWER EXTREMITY ROM:  Active ROM Right eval Left eval  Ankle dorsiflexion 10 8  Ankle plantarflexion WNL WNL  Ankle inversion 34 42  Ankle eversion 16 36   (Blank rows = not tested)  LOWER EXTREMITY MMT:  MMT Right eval Left eval  Ankle dorsiflexion 5 5  Ankle plantarflexion 5 5  Ankle inversion 5 5  Ankle eversion 5 5   (Blank rows = not tested)  LOWER EXTREMITY SPECIAL TESTS:  Ankle special tests: Dorsiflexion-Eversion test: positive   FUNCTIONAL TESTS:  5 times sit to stand: 10.56 sec Timed up and go (TUG): 8.00 sec  GAIT: Distance walked: 50 Assistive device utilized: None Level of  assistance: Complete Independence Comments: normal heel to toe progression   TODAY'S TREATMENT:                                                                                                                              DATE: 8/15 Rocker board x 2 min Standing gastroc stretch x 10 holding 10 sec Soleus stretch x 10 holding 10 sec each Inversion/Eversion on half roll x 1 min Tband ankle DF, inversion and eversion x 20 each Manual: PROM right ankle DF, PF , INV, EV Manual: cross friction massage to peroneal tendon, IASTM to peroneal tendon, achilles and gastroc-soleus Manual: forefoot joint mobs  DATE: 8/7 Seated toe and heel raises x 20 each Ankle alphabet A-Z Rocker board x 2 min Tband ankle DF, inversion and eversion x 20 each Manual: PROM right ankle DF, PF , INV, EV Manual: cupping to right peroneals, cross friction massage to peroneal tendon Manual: forefoot joint mobs  DATE: 7/25  Initial eval completed  Inititated HEP   PATIENT EDUCATION:  Education details: Initiated HEP Person educated: Patient Education method: Programmer, multimedia, Facilities manager, Verbal cues, and Handouts Education comprehension: verbalized understanding, returned demonstration, and verbal cues required  HOME EXERCISE PROGRAM: Access Code: [email or web address] URL: [email or web address] Date: 12/23/2022 Prepared by: Mikey Kirschner  Exercises - Seated Ankle Inversion Eversion PROM  - 1 x daily - 7 x weekly - 3 sets - 10 reps - Single Leg Stance  - 1 x daily - 7 x weekly - 3 sets - 10 reps - Lateral Ankle Scar Massage  - 1 x daily - 7 x weekly - 1 sets - 1 reps - 2 min hold  ASSESSMENT:  CLINICAL IMPRESSION: Naeemah is progressing appropriately.  She demonstrates good stability on dynamic SLS activity today but continues to lack use of glut medius.    She would benefit from review of hip strengthening exercises that will assist with knee and ankle stability.  We will continue to address these issues  with skilled PT.      OBJECTIVE IMPAIRMENTS: difficulty walking, decreased ROM, increased fascial restrictions, increased muscle spasms, impaired flexibility, and pain.   ACTIVITY LIMITATIONS: standing, squatting, and sleeping  PARTICIPATION LIMITATIONS: meal prep, cleaning, laundry, shopping, community activity, yard work, and church  PERSONAL FACTORS: 1-2  comorbidities: OA, Htn  are also affecting patient's functional outcome.   REHAB POTENTIAL: Good  CLINICAL DECISION MAKING: Stable/uncomplicated  EVALUATION COMPLEXITY: Low   GOALS: Goals reviewed with patient? Yes  SHORT TERM GOALS: Target date: 01/20/2023   Pain report to be no greater than 3/10  Baseline: Goal status: MET 01/05/23  2.  Patient will be independent with initial HEP  Baseline:  Goal status: MET 01/05/23  LONG TERM GOALS: Target date: 02/17/2023   Patient to report pain no greater than 1/10  Baseline:  Goal status: INITIAL  2.  Patient to be independent with advanced HEP  Baseline:  Goal status: INITIAL  3.  Patient able to maintain SLS x 20 sec on floor Baseline:  Goal status: INITIAL  4.  Patient to be able to sleep through the night  Baseline:  Goal status: INITIAL  5.  Patient to be able to do all yoga poses without modification Baseline:  Goal status: INITIAL  6.  FOTO score to be 73 Baseline: 66 Goal status: INITIAL   PLAN:  PT FREQUENCY: 1x/week  PT DURATION: 8 weeks  PLANNED INTERVENTIONS: Therapeutic exercises, Therapeutic activity, Neuromuscular re-education, Balance training, Gait training, Patient/Family education, Self Care, Joint mobilization, Stair training, Orthotic/Fit training, Aquatic Therapy, Dry Needling, Electrical stimulation, Cryotherapy, Moist heat, Compression bandaging, Splintting, Taping, Vasopneumatic device, Ultrasound, Ionotophoresis 4mg /ml Dexamethasone, Manual therapy, and Re-evaluation  PLAN FOR NEXT SESSION: Re-visit glut medius and abductor  strengthening,  STM, cross friction, possible IASTM, short foot, ankle stability training.  Ice or game ready if needed.  DN if needed.   Victorino Dike B. Daine Croker, PT 01/13/23 5:02 PM [facility] [address] [place], [place] [address] Phone # [phone] Fax [phone]

## 2023-01-20 ENCOUNTER — Ambulatory Visit: Payer: PPO

## 2023-01-20 DIAGNOSIS — M25571 Pain in right ankle and joints of right foot: Secondary | ICD-10-CM

## 2023-01-20 DIAGNOSIS — R262 Difficulty in walking, not elsewhere classified: Secondary | ICD-10-CM

## 2023-01-20 DIAGNOSIS — R252 Cramp and spasm: Secondary | ICD-10-CM

## 2023-01-20 DIAGNOSIS — M25671 Stiffness of right ankle, not elsewhere classified: Secondary | ICD-10-CM

## 2023-01-20 DIAGNOSIS — M79604 Pain in right leg: Secondary | ICD-10-CM

## 2023-01-20 DIAGNOSIS — M79605 Pain in left leg: Secondary | ICD-10-CM

## 2023-01-20 DIAGNOSIS — M6281 Muscle weakness (generalized): Secondary | ICD-10-CM

## 2023-01-20 NOTE — Therapy (Signed)
OUTPATIENT PHYSICAL THERAPY LOWER EXTREMITY TREATMENT NOTE   Patient Name: Jamie Glover MRN: [id number] DOB:1955-07-23, 66 y.o., female Today's Date: 01/20/2023  END OF SESSION:  PT End of Session - 01/20/23 1018     Visit Number 4    Date for PT Re-Evaluation 02/17/23    Authorization Type [employer]    Progress Note Due on Visit 10    PT Start Time 1018    PT Stop Time 1100    PT Time Calculation (min) 42 min    Activity Tolerance Patient tolerated treatment well    Behavior During Therapy WFL for tasks assessed/performed             Past Medical History:  Diagnosis Date   Arthritis    Hypertension    OSA (obstructive sleep apnea) 2013   Past Surgical History:  Procedure Laterality Date   BREAST BIOPSY Right    c-sections     CHOLECYSTECTOMY     CHOLECYSTECTOMY, LAPAROSCOPIC  1996   right knee surgery     Patient Active Problem List   Diagnosis Date Noted   Peroneal tendonitis, right 09/15/2021   Unilateral primary osteoarthritis, left knee 04/14/2020   Primary osteoarthritis of left knee 01/15/2019   Unilateral primary osteoarthritis, right knee 12/26/2017   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 ear 12/19/2017   Eustachian tube dysfunction, left 12/19/2017   Chronic serous otitis media of left ear 12/19/2017   Tinnitus 10/01/2011   Vitamin D deficiency 10/01/2011   GERD (gastroesophageal reflux disease) 10/01/2011   Neck pain 10/01/2011   HTN (hypertension) 10/01/2011   Sleep apnea 09/29/2011   BMI 33.0-33.9,adult 09/29/2011    PCP: Jamie Heron, MD   REFERRING PROVIDER: Inez Pilgrim, NP  REFERRING DIAG: M25.571 (ICD-10-CM) - Pain in right ankle and joints of right foot  THERAPY DIAG:  Pain in right ankle and joints of right foot  Stiffness of right ankle, not elsewhere classified  Difficulty in walking, not elsewhere classified  Muscle weakness (generalized)  Cramp  and spasm  Pain in right leg  Pain in left leg  Rationale for Evaluation and Treatment: Rehabilitation  ONSET DATE: 12/16/2022  SUBJECTIVE:   SUBJECTIVE STATEMENT: Patient states she was fairly sore after last visit.   PERTINENT HISTORY: na PAIN:  Are you having pain?  Minimal at the moment but I am aware of it. 1-2/10 at its worst in past 24 hours  PRECAUTIONS: None  RED FLAGS: None   WEIGHT BEARING RESTRICTIONS: No  FALLS:  Has patient fallen in last 6 months? No  LIVING ENVIRONMENT: Lives with: lives with their family Lives in: House/apartment   OCCUPATION: Retired  PLOF: Independent  PATIENT GOALS: Not having to avoid stances in yoga and to avoid waking up at night  NEXT MD VISIT: prn  OBJECTIVE:   DIAGNOSTIC FINDINGS: na  PATIENT SURVEYS:  FOTO 66, predicted 39  COGNITION: Overall cognitive status: Within functional limits for tasks assessed     SENSATION: WFL  POSTURE:  slight knee valgus  PALPATION: Tender to palpation over the peroneal tendon   LOWER EXTREMITY ROM:  Active ROM Right eval Left eval  Ankle dorsiflexion 10 8  Ankle plantarflexion WNL WNL  Ankle inversion 34 42  Ankle eversion 16 36   (Blank rows = not tested)  LOWER EXTREMITY MMT:  MMT Right eval Left eval  Ankle dorsiflexion 5 5  Ankle plantarflexion 5 5  Ankle inversion 5  5  Ankle eversion 5 5   (Blank rows = not tested)  LOWER EXTREMITY SPECIAL TESTS:  Ankle special tests: Dorsiflexion-Eversion test: positive   FUNCTIONAL TESTS:  5 times sit to stand: 10.56 sec Timed up and go (TUG): 8.00 sec  GAIT: Distance walked: 50 Assistive device utilized: None Level of assistance: Complete Independence Comments: normal heel to toe progression   TODAY'S TREATMENT:                                                                                                                              DATE: 8/22 Rocker board x 2 min Standing gastroc stretch x 10  holding 10 sec Soleus stretch x 10 holding 10 sec each Inversion/Eversion on half roll x 1 min Standing hip ER/abd with yellow loop around knees in slightly squatted position Lateral band walks x 3 laps of approx 15 feet Seated clam x 20 with yellow loop Side lying clam with yellow loop x 20 each Side lying reverse clam x 20 each with blue loop    DATE: 8/15 Rocker board x 2 min Standing gastroc stretch x 10 holding 10 sec Soleus stretch x 10 holding 10 sec each Inversion/Eversion on half roll x 1 min Tband ankle DF, inversion and eversion x 20 each Manual: PROM right ankle DF, PF , INV, EV Manual: cross friction massage to peroneal tendon, IASTM to peroneal tendon, achilles and gastroc-soleus Manual: forefoot joint mobs  DATE: 8/7 Seated toe and heel raises x 20 each Ankle alphabet A-Z Rocker board x 2 min Tband ankle DF, inversion and eversion x 20 each Manual: PROM right ankle DF, PF , INV, EV Manual: cupping to right peroneals, cross friction massage to peroneal tendon Manual: forefoot joint mobs  DATE: 7/25  Initial eval completed  Inititated HEP   PATIENT EDUCATION:  Education details: Initiated HEP Person educated: Patient Education method: Programmer, multimedia, Facilities manager, Verbal cues, and Handouts Education comprehension: verbalized understanding, returned demonstration, and verbal cues required  HOME EXERCISE PROGRAM: Access Code: [email or web address] URL: [email or web address] Date: 12/23/2022 Prepared by: Jamie Glover  Exercises - Seated Ankle Inversion Eversion PROM  - 1 x daily - 7 x weekly - 3 sets - 10 reps - Single Leg Stance  - 1 x daily - 7 x weekly - 3 sets - 10 reps - Lateral Ankle Scar Massage  - 1 x daily - 7 x weekly - 1 sets - 1 reps - 2 min hold  ASSESSMENT:  CLINICAL IMPRESSION: [name] had some discomfort and soreness in ankle after last visit but this dissipated per her report.  We focused on hip and quad strength today as her valgus knee  posture is likely continuing to contribute to her ankle issues.  She completed all tasks with moderate fatigue.   We will continue to address these issues with skilled PT.      OBJECTIVE IMPAIRMENTS: difficulty walking, decreased ROM, increased fascial restrictions, increased muscle spasms, impaired flexibility, and  pain.   ACTIVITY LIMITATIONS: standing, squatting, and sleeping  PARTICIPATION LIMITATIONS: meal prep, cleaning, laundry, shopping, community activity, yard work, and church  PERSONAL FACTORS: 1-2 comorbidities: OA, Htn  are also affecting patient's functional outcome.   REHAB POTENTIAL: Good  CLINICAL DECISION MAKING: Stable/uncomplicated  EVALUATION COMPLEXITY: Low   GOALS: Goals reviewed with patient? Yes  SHORT TERM GOALS: Target date: 01/20/2023   Pain report to be no greater than 3/10  Baseline: Goal status: MET 01/05/23  2.  Patient will be independent with initial HEP  Baseline:  Goal status: MET 01/05/23  LONG TERM GOALS: Target date: 02/17/2023   Patient to report pain no greater than 1/10  Baseline:  Goal status: INITIAL  2.  Patient to be independent with advanced HEP  Baseline:  Goal status: INITIAL  3.  Patient able to maintain SLS x 20 sec on floor Baseline:  Goal status: INITIAL  4.  Patient to be able to sleep through the night  Baseline:  Goal status: INITIAL  5.  Patient to be able to do all yoga poses without modification Baseline:  Goal status: INITIAL  6.  FOTO score to be 73 Baseline: 66 Goal status: INITIAL   PLAN:  PT FREQUENCY: 1x/week  PT DURATION: 8 weeks  PLANNED INTERVENTIONS: Therapeutic exercises, Therapeutic activity, Neuromuscular re-education, Balance training, Gait training, Patient/Family education, Self Care, Joint mobilization, Stair training, Orthotic/Fit training, Aquatic Therapy, Dry Needling, Electrical stimulation, Cryotherapy, Moist heat, Compression bandaging, Splintting, Taping, Vasopneumatic device,  Ultrasound, Ionotophoresis 4mg /ml Dexamethasone, Manual therapy, and Re-evaluation  PLAN FOR NEXT SESSION: continue glut medius and abductor strengthening,  STM, cross friction, possible IASTM, short foot, ankle stability training.  Ice or game ready if needed.  DN if needed.   Victorino Dike B. Dynisha Due, PT 01/20/23 11:56 AM [facility] [address], [address] [place], [place] [address] Phone # [phone] Fax [phone]

## 2023-01-27 ENCOUNTER — Ambulatory Visit: Payer: PPO

## 2023-01-27 DIAGNOSIS — R262 Difficulty in walking, not elsewhere classified: Secondary | ICD-10-CM

## 2023-01-27 DIAGNOSIS — M25571 Pain in right ankle and joints of right foot: Secondary | ICD-10-CM

## 2023-01-27 DIAGNOSIS — M79605 Pain in left leg: Secondary | ICD-10-CM

## 2023-01-27 DIAGNOSIS — M6281 Muscle weakness (generalized): Secondary | ICD-10-CM

## 2023-01-27 DIAGNOSIS — R252 Cramp and spasm: Secondary | ICD-10-CM

## 2023-01-27 DIAGNOSIS — M79604 Pain in right leg: Secondary | ICD-10-CM

## 2023-01-27 DIAGNOSIS — M25671 Stiffness of right ankle, not elsewhere classified: Secondary | ICD-10-CM

## 2023-01-27 NOTE — Therapy (Signed)
OUTPATIENT PHYSICAL THERAPY LOWER EXTREMITY TREATMENT NOTE   Patient Name: Jamie Glover MRN: [id number] DOB:Sep 07, 1955, 67 y.o., female Today's Date: 01/27/2023  END OF SESSION:  PT End of Session - 01/27/23 0916     Visit Number 5    Date for PT Re-Evaluation 02/17/23    Authorization Type [employer]    Progress Note Due on Visit 10    PT Start Time 0845    PT Stop Time 0930    PT Time Calculation (min) 45 min    Activity Tolerance Patient tolerated treatment well    Behavior During Therapy [facility] for tasks assessed/performed             Past Medical History:  Diagnosis Date   Arthritis    Hypertension    OSA (obstructive sleep apnea) 2013   Past Surgical History:  Procedure Laterality Date   BREAST BIOPSY Right    c-sections     CHOLECYSTECTOMY     CHOLECYSTECTOMY, LAPAROSCOPIC  1996   right knee surgery     Patient Active Problem List   Diagnosis Date Noted   Peroneal tendonitis, right 09/15/2021   Unilateral primary osteoarthritis, left knee 04/14/2020   Primary osteoarthritis of left knee 01/15/2019   Unilateral primary osteoarthritis, right knee 12/26/2017   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 ear 12/19/2017   Eustachian tube dysfunction, left 12/19/2017   Chronic serous otitis media of left ear 12/19/2017   Tinnitus 10/01/2011   Vitamin D deficiency 10/01/2011   GERD (gastroesophageal reflux disease) 10/01/2011   Neck pain 10/01/2011   HTN (hypertension) 10/01/2011   Sleep apnea 09/29/2011   BMI 33.0-33.9,adult 09/29/2011    PCP: Clayborn Heron, MD   REFERRING PROVIDER: Inez Pilgrim, NP  REFERRING DIAG: M25.571 (ICD-10-CM) - Pain in right ankle and joints of right foot  THERAPY DIAG:  Pain in right ankle and joints of right foot  Stiffness of right ankle, not elsewhere classified  Difficulty in walking, not elsewhere classified  Muscle weakness (generalized)  Cramp  and spasm  Pain in right leg  Pain in left leg  Rationale for Evaluation and Treatment: Rehabilitation  ONSET DATE: 12/16/2022  SUBJECTIVE:   SUBJECTIVE STATEMENT: Patient states she went kayaking and did great getting in and out of the boat.  She states she did have a lot of hip soreness after last visit but this resolved gradually.  She states she also had some pain in the bottom of the right foot which also went away.    PERTINENT HISTORY: na PAIN:  Are you having pain?  Minimal at the moment but I am aware of it. 1-2/10 at its worst in past 24 hours  PRECAUTIONS: None  RED FLAGS: None   WEIGHT BEARING RESTRICTIONS: No  FALLS:  Has patient fallen in last 6 months? No  LIVING ENVIRONMENT: Lives with: lives with their family Lives in: House/apartment   OCCUPATION: Retired  PLOF: Independent  PATIENT GOALS: Not having to avoid stances in yoga and to avoid waking up at night  NEXT MD VISIT: prn  OBJECTIVE:   DIAGNOSTIC FINDINGS: na  PATIENT SURVEYS:  FOTO 66, predicted 74  COGNITION: Overall cognitive status: Within functional limits for tasks assessed     SENSATION: WFL  POSTURE:  slight knee valgus  PALPATION: Tender to palpation over the peroneal tendon   LOWER EXTREMITY ROM:  Active ROM Right eval Left eval  Ankle dorsiflexion 10 8  Ankle  plantarflexion WNL WNL  Ankle inversion 34 42  Ankle eversion 16 36   (Blank rows = not tested)  LOWER EXTREMITY MMT:  MMT Right eval Left eval  Ankle dorsiflexion 5 5  Ankle plantarflexion 5 5  Ankle inversion 5 5  Ankle eversion 5 5   (Blank rows = not tested)  LOWER EXTREMITY SPECIAL TESTS:  Ankle special tests: Dorsiflexion-Eversion test: positive   FUNCTIONAL TESTS:  5 times sit to stand: 10.56 sec Timed up and go (TUG): 8.00 sec  GAIT: Distance walked: 50 Assistive device utilized: None Level of assistance: Complete Independence Comments: normal heel to toe  progression   TODAY'S TREATMENT:                                                                                                                              DATE: 8/29 Nustep x 5 min level 6 Rocker board x 2 min Blue loop around knees : step up and over x 20 using 6 inch step SLS cone taps 3 x 10 each (cone on upright 6" step) 3 direction ball toss 20 x each LE (used kick ball on wall) Inversion/Eversion on half roll x 1 min Standing hip ER/abd with yellow loop around knees in slightly squatted position Lateral band walks x 3 laps of approx 15 feet Seated clam x 20 with yellow loop Side lying clam with yellow loop x 20 each  DATE: 8/22 Rocker board x 2 min Standing gastroc stretch x 10 holding 10 sec Soleus stretch x 10 holding 10 sec each Inversion/Eversion on half roll x 1 min Standing hip ER/abd with yellow loop around knees in slightly squatted position Lateral band walks x 3 laps of approx 15 feet Seated clam x 20 with yellow loop Side lying clam with yellow loop x 20 each Side lying reverse clam x 20 each with blue loop    DATE: 8/15 Rocker board x 2 min Standing gastroc stretch x 10 holding 10 sec Soleus stretch x 10 holding 10 sec each Inversion/Eversion on half roll x 1 min Tband ankle DF, inversion and eversion x 20 each Manual: PROM right ankle DF, PF , INV, EV Manual: cross friction massage to peroneal tendon, IASTM to peroneal tendon, achilles and gastroc-soleus Manual: forefoot joint mobs  DATE: 7/25  Initial eval completed  Inititated HEP   PATIENT EDUCATION:  Education details: Initiated HEP Person educated: Patient Education method: Programmer, multimedia, Facilities manager, Verbal cues, and Handouts Education comprehension: verbalized understanding, returned demonstration, and verbal cues required  HOME EXERCISE PROGRAM: Access Code: [email or web address] URL: [email or web address] Date: 12/23/2022 Prepared by: Mikey Kirschner  Exercises - Seated Ankle  Inversion Eversion PROM  - 1 x daily - 7 x weekly - 3 sets - 10 reps - Single Leg Stance  - 1 x daily - 7 x weekly - 3 sets - 10 reps - Lateral Ankle Scar Massage  - 1 x daily - 7 x weekly - 1 sets -  1 reps - 2 min hold  ASSESSMENT:  CLINICAL IMPRESSION: Rogue had some soreness after last visit but this dissipated gradually.  We continued to focus on hip strength and ankle stability today.  She completed all tasks with ease and without pain.  She is progressing appropriately.  She was able to go kayaking for the first time in a while which was one of her goals.    We will continue to address hip and ankle stability with skilled PT.      OBJECTIVE IMPAIRMENTS: difficulty walking, decreased ROM, increased fascial restrictions, increased muscle spasms, impaired flexibility, and pain.   ACTIVITY LIMITATIONS: standing, squatting, and sleeping  PARTICIPATION LIMITATIONS: meal prep, cleaning, laundry, shopping, community activity, yard work, and church  PERSONAL FACTORS: 1-2 comorbidities: OA, Htn  are also affecting patient's functional outcome.   REHAB POTENTIAL: Good  CLINICAL DECISION MAKING: Stable/uncomplicated  EVALUATION COMPLEXITY: Low   GOALS: Goals reviewed with patient? Yes  SHORT TERM GOALS: Target date: 01/20/2023   Pain report to be no greater than 3/10  Baseline: Goal status: MET 01/05/23  2.  Patient will be independent with initial HEP  Baseline:  Goal status: MET 01/05/23  LONG TERM GOALS: Target date: 02/17/2023   Patient to report pain no greater than 1/10  Baseline:  Goal status: INITIAL  2.  Patient to be independent with advanced HEP  Baseline:  Goal status: INITIAL  3.  Patient able to maintain SLS x 20 sec on floor Baseline:  Goal status: INITIAL  4.  Patient to be able to sleep through the night  Baseline:  Goal status: INITIAL  5.  Patient to be able to do all yoga poses without modification Baseline:  Goal status: INITIAL  6.  FOTO score to  be 73 Baseline: 66 Goal status: INITIAL   PLAN:  PT FREQUENCY: 1x/week  PT DURATION: 8 weeks  PLANNED INTERVENTIONS: Therapeutic exercises, Therapeutic activity, Neuromuscular re-education, Balance training, Gait training, Patient/Family education, Self Care, Joint mobilization, Stair training, Orthotic/Fit training, Aquatic Therapy, Dry Needling, Electrical stimulation, Cryotherapy, Moist heat, Compression bandaging, Splintting, Taping, Vasopneumatic device, Ultrasound, Ionotophoresis 4mg /ml Dexamethasone, Manual therapy, and Re-evaluation  PLAN FOR NEXT SESSION: continue glut medius and abductor strengthening,  STM, cross friction, possible IASTM, short foot, ankle stability training.  Ice or game ready if needed.  DN if needed.   Victorino Dike B. Nadeem Romanoski, PT 01/27/23 3:32 PM  [facility] [address] [place], [place] [address] Phone # [phone] Fax [phone]

## 2023-02-03 ENCOUNTER — Ambulatory Visit: Payer: PPO | Attending: Family Medicine

## 2023-02-03 DIAGNOSIS — M79605 Pain in left leg: Secondary | ICD-10-CM | POA: Insufficient documentation

## 2023-02-03 DIAGNOSIS — M25671 Stiffness of right ankle, not elsewhere classified: Secondary | ICD-10-CM | POA: Insufficient documentation

## 2023-02-03 DIAGNOSIS — R293 Abnormal posture: Secondary | ICD-10-CM | POA: Diagnosis not present

## 2023-02-03 DIAGNOSIS — R252 Cramp and spasm: Secondary | ICD-10-CM | POA: Diagnosis not present

## 2023-02-03 DIAGNOSIS — M25571 Pain in right ankle and joints of right foot: Secondary | ICD-10-CM | POA: Diagnosis not present

## 2023-02-03 DIAGNOSIS — R262 Difficulty in walking, not elsewhere classified: Secondary | ICD-10-CM | POA: Insufficient documentation

## 2023-02-03 DIAGNOSIS — M6281 Muscle weakness (generalized): Secondary | ICD-10-CM | POA: Diagnosis not present

## 2023-02-03 DIAGNOSIS — M79604 Pain in right leg: Secondary | ICD-10-CM | POA: Insufficient documentation

## 2023-02-03 NOTE — Therapy (Signed)
OUTPATIENT PHYSICAL THERAPY LOWER EXTREMITY TREATMENT NOTE   Patient Name: TRAMYA HYLTON MRN: [id number] DOB:May 17, 1956, 67 y.o., female Today's Date: 02/03/2023  END OF SESSION:  PT End of Session - 02/03/23 0940     Visit Number 6    Date for PT Re-Evaluation 02/17/23    Authorization Type [employer]    Progress Note Due on Visit 10    PT Start Time 0930    PT Stop Time 1015    PT Time Calculation (min) 45 min    Activity Tolerance Patient tolerated treatment well    Behavior During Therapy [facility] for tasks assessed/performed             Past Medical History:  Diagnosis Date   Arthritis    Hypertension    OSA (obstructive sleep apnea) 2013   Past Surgical History:  Procedure Laterality Date   BREAST BIOPSY Right    c-sections     CHOLECYSTECTOMY     CHOLECYSTECTOMY, LAPAROSCOPIC  1996   right knee surgery     Patient Active Problem List   Diagnosis Date Noted   Peroneal tendonitis, right 09/15/2021   Unilateral primary osteoarthritis, left knee 04/14/2020   Primary osteoarthritis of left knee 01/15/2019   Unilateral primary osteoarthritis, right knee 12/26/2017   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 ear 12/19/2017   Eustachian tube dysfunction, left 12/19/2017   Chronic serous otitis media of left ear 12/19/2017   Tinnitus 10/01/2011   Vitamin D deficiency 10/01/2011   GERD (gastroesophageal reflux disease) 10/01/2011   Neck pain 10/01/2011   HTN (hypertension) 10/01/2011   Sleep apnea 09/29/2011   BMI 33.0-33.9,adult 09/29/2011    PCP: Clayborn Heron, MD   REFERRING PROVIDER: Inez Pilgrim, NP  REFERRING DIAG: M25.571 (ICD-10-CM) - Pain in right ankle and joints of right foot  THERAPY DIAG:  Pain in right ankle and joints of right foot  Stiffness of right ankle, not elsewhere classified  Difficulty in walking, not elsewhere classified  Muscle weakness (generalized)  Cramp  and spasm  Pain in right leg  Pain in left leg  Rationale for Evaluation and Treatment: Rehabilitation  ONSET DATE: 12/16/2022  SUBJECTIVE:   SUBJECTIVE STATEMENT: Patient reports my right foot pain is really bothering me again.     PERTINENT HISTORY: na PAIN:  02/03/23 Are you having pain?  1-2/10 at its worst in past 24 hours  However, it can get as bad as 6-7 even 8/10 at times since I was here last and its staying swollen in that area.    PRECAUTIONS: None  RED FLAGS: None   WEIGHT BEARING RESTRICTIONS: No  FALLS:  Has patient fallen in last 6 months? No  LIVING ENVIRONMENT: Lives with: lives with their family Lives in: House/apartment   OCCUPATION: Retired  PLOF: Independent  PATIENT GOALS: Not having to avoid stances in yoga and to avoid waking up at night  NEXT MD VISIT: prn  OBJECTIVE:   DIAGNOSTIC FINDINGS: na  PATIENT SURVEYS:  FOTO 66, predicted 28  COGNITION: Overall cognitive status: Within functional limits for tasks assessed     SENSATION: WFL  POSTURE:  slight knee valgus  PALPATION: Tender to palpation over the peroneal tendon   LOWER EXTREMITY ROM:  Active ROM Right eval Left eval  Ankle dorsiflexion 10 8  Ankle plantarflexion WNL WNL  Ankle inversion 34 42  Ankle eversion 16 36   (Blank rows = not tested)  LOWER  EXTREMITY MMT:  MMT Right eval Left eval  Ankle dorsiflexion 5 5  Ankle plantarflexion 5 5  Ankle inversion 5 5  Ankle eversion 5 5   (Blank rows = not tested)  LOWER EXTREMITY SPECIAL TESTS:  Ankle special tests: Dorsiflexion-Eversion test: positive   FUNCTIONAL TESTS:  5 times sit to stand: 10.56 sec Timed up and go (TUG): 8.00 sec  GAIT: Distance walked: 50 Assistive device utilized: None Level of assistance: Complete Independence Comments: normal heel to toe progression   TODAY'S TREATMENT:                                                                                                                               DATE: 02/03/23 Nustep x 5 min level 6 Hip matrix : abduction and extension 2 x 10 each with 30 lbs Lateral band walks x 3 laps of approx 15 feet Attempted Blue loop around knees : step up and over x 20 using 6 inch step (right ankle pain worsened) Standing rocker board x 2 min Inversion/Eversion on half roll x 1 min Ionto patch #1 with 6 hour wearing time to right lateral ankle.   DATE: 8/29 Nustep x 5 min level 6 Rocker board x 2 min Blue loop around knees : step up and over x 20 using 6 inch step SLS cone taps 3 x 10 each (cone on upright 6" step) 3 direction ball toss 20 x each LE (used kick ball on wall) Inversion/Eversion on half roll x 1 min Standing hip ER/abd with yellow loop around knees in slightly squatted position Lateral band walks x 3 laps of approx 15 feet Seated clam x 20 with yellow loop Side lying clam with yellow loop x 20 each  DATE: 8/22 Rocker board x 2 min Standing gastroc stretch x 10 holding 10 sec Soleus stretch x 10 holding 10 sec each Inversion/Eversion on half roll x 1 min Standing hip ER/abd with yellow loop around knees in slightly squatted position Lateral band walks x 3 laps of approx 15 feet Seated clam x 20 with yellow loop Side lying clam with yellow loop x 20 each Side lying reverse clam x 20 each with blue loop   DATE: 7/25  Initial eval completed  Inititated HEP   PATIENT EDUCATION:  Education details: Initiated HEP Person educated: Patient Education method: Programmer, multimedia, Facilities manager, Verbal cues, and Handouts Education comprehension: verbalized understanding, returned demonstration, and verbal cues required  HOME EXERCISE PROGRAM: Access Code: [email or web address] URL: [email or web address] Date: 12/23/2022 Prepared by: Mikey Kirschner  Exercises - Seated Ankle Inversion Eversion PROM  - 1 x daily - 7 x weekly - 3 sets - 10 reps - Single Leg Stance  - 1 x daily - 7 x weekly - 3 sets - 10 reps - Lateral  Ankle Scar Massage  - 1 x daily - 7 x weekly - 1 sets - 1 reps - 2 min hold  ASSESSMENT:  CLINICAL IMPRESSION: Yukiko  is having an exacerbation of the lateral ankle pain and there is notable swelling at the lateral ankle.  She had some difficulty on several tasks today.  We discussed how her knee alignment has significantly improved and her knee pain is well controlled but that the valgus angle will likely continue to affect other areas of the kinetic chain like the ankle and hip.  Suggested she be evaluated for appropriate orthotics.  We also discussed possibility of re-visiting the suggested TKA by her ortho MD.     We will continue to address hip and ankle stability with skilled PT.      OBJECTIVE IMPAIRMENTS: difficulty walking, decreased ROM, increased fascial restrictions, increased muscle spasms, impaired flexibility, and pain.   ACTIVITY LIMITATIONS: standing, squatting, and sleeping  PARTICIPATION LIMITATIONS: meal prep, cleaning, laundry, shopping, community activity, yard work, and church  PERSONAL FACTORS: 1-2 comorbidities: OA, Htn  are also affecting patient's functional outcome.   REHAB POTENTIAL: Good  CLINICAL DECISION MAKING: Stable/uncomplicated  EVALUATION COMPLEXITY: Low   GOALS: Goals reviewed with patient? Yes  SHORT TERM GOALS: Target date: 01/20/2023   Pain report to be no greater than 3/10  Baseline: Goal status: MET 01/05/23  2.  Patient will be independent with initial HEP  Baseline:  Goal status: MET 01/05/23  LONG TERM GOALS: Target date: 02/17/2023   Patient to report pain no greater than 1/10  Baseline:  Goal status: INITIAL  2.  Patient to be independent with advanced HEP  Baseline:  Goal status: INITIAL  3.  Patient able to maintain SLS x 20 sec on floor Baseline:  Goal status: INITIAL  4.  Patient to be able to sleep through the night  Baseline:  Goal status: INITIAL  5.  Patient to be able to do all yoga poses without  modification Baseline:  Goal status: INITIAL  6.  FOTO score to be 73 Baseline: 66 Goal status: INITIAL   PLAN:  PT FREQUENCY: 1x/week  PT DURATION: 8 weeks  PLANNED INTERVENTIONS: Therapeutic exercises, Therapeutic activity, Neuromuscular re-education, Balance training, Gait training, Patient/Family education, Self Care, Joint mobilization, Stair training, Orthotic/Fit training, Aquatic Therapy, Dry Needling, Electrical stimulation, Cryotherapy, Moist heat, Compression bandaging, Splintting, Taping, Vasopneumatic device, Ultrasound, Ionotophoresis 4mg /ml Dexamethasone, Manual therapy, and Re-evaluation  PLAN FOR NEXT SESSION: Progress glut medius and abductor strengthening,  STM, cross friction, possible IASTM, short foot, ankle stability training.  Ice or game ready if needed.  DN if needed.   Victorino Dike B. Jacinto Keil, PT 02/03/23 11:14 AM [facility] [address], [address] [place], [place] [address] Phone # [phone] Fax [phone]

## 2023-02-10 ENCOUNTER — Ambulatory Visit: Payer: PPO

## 2023-02-10 DIAGNOSIS — R262 Difficulty in walking, not elsewhere classified: Secondary | ICD-10-CM

## 2023-02-10 DIAGNOSIS — M79604 Pain in right leg: Secondary | ICD-10-CM

## 2023-02-10 DIAGNOSIS — R252 Cramp and spasm: Secondary | ICD-10-CM

## 2023-02-10 DIAGNOSIS — M25571 Pain in right ankle and joints of right foot: Secondary | ICD-10-CM

## 2023-02-10 DIAGNOSIS — M25671 Stiffness of right ankle, not elsewhere classified: Secondary | ICD-10-CM

## 2023-02-10 DIAGNOSIS — M6281 Muscle weakness (generalized): Secondary | ICD-10-CM

## 2023-02-10 NOTE — Therapy (Signed)
OUTPATIENT PHYSICAL THERAPY LOWER EXTREMITY TREATMENT NOTE   Patient Name: Jamie Glover MRN: [id number] DOB:09-29-1955, 67 y.o., female Today's Date: 02/10/2023  END OF SESSION:  PT End of Session - 02/10/23 1014     Visit Number 7    Date for PT Re-Evaluation 02/17/23    Authorization Type [employer]    Progress Note Due on Visit 10    PT Start Time 0930    PT Stop Time 1000    PT Time Calculation (min) 30 min    Activity Tolerance Patient tolerated treatment well    Behavior During Therapy [facility] for tasks assessed/performed             Past Medical History:  Diagnosis Date   Arthritis    Hypertension    OSA (obstructive sleep apnea) 2013   Past Surgical History:  Procedure Laterality Date   BREAST BIOPSY Right    c-sections     CHOLECYSTECTOMY     CHOLECYSTECTOMY, LAPAROSCOPIC  1996   right knee surgery     Patient Active Problem List   Diagnosis Date Noted   Peroneal tendonitis, right 09/15/2021   Unilateral primary osteoarthritis, left knee 04/14/2020   Primary osteoarthritis of left knee 01/15/2019   Unilateral primary osteoarthritis, right knee 12/26/2017   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 ear 12/19/2017   Eustachian tube dysfunction, left 12/19/2017   Chronic serous otitis media of left ear 12/19/2017   Tinnitus 10/01/2011   Vitamin D deficiency 10/01/2011   GERD (gastroesophageal reflux disease) 10/01/2011   Neck pain 10/01/2011   HTN (hypertension) 10/01/2011   Sleep apnea 09/29/2011   BMI 33.0-33.9,adult 09/29/2011    PCP: Clayborn Heron, MD   REFERRING PROVIDER: Inez Pilgrim, NP  REFERRING DIAG: M25.571 (ICD-10-CM) - Pain in right ankle and joints of right foot  THERAPY DIAG:  Pain in right ankle and joints of right foot  Stiffness of right ankle, not elsewhere classified  Difficulty in walking, not elsewhere classified  Muscle weakness (generalized)  Cramp  and spasm  Pain in right leg  Rationale for Evaluation and Treatment: Rehabilitation  ONSET DATE: 12/16/2022  SUBJECTIVE:   SUBJECTIVE STATEMENT: Patient reports my right foot pain has not subsided much since last visit. Still swollen per patient and I am getting some strange pains in the toes and ball of my foot at times now.  Has not obtained or been assessed for orthotics.       PERTINENT HISTORY: na PAIN:  02/03/23 Are you having pain?  1-2/10 at its worst in past 24 hours  However, it can get as bad as 6-7 even 8/10 at times since I was here last and its staying swollen in that area.    PRECAUTIONS: None  RED FLAGS: None   WEIGHT BEARING RESTRICTIONS: No  FALLS:  Has patient fallen in last 6 months? No  LIVING ENVIRONMENT: Lives with: lives with their family Lives in: House/apartment   OCCUPATION: Retired  PLOF: Independent  PATIENT GOALS: Not having to avoid stances in yoga and to avoid waking up at night  NEXT MD VISIT: prn  OBJECTIVE:   DIAGNOSTIC FINDINGS: na  PATIENT SURVEYS:  FOTO 66, predicted 50  COGNITION: Overall cognitive status: Within functional limits for tasks assessed     SENSATION: WFL  POSTURE:  slight knee valgus  PALPATION: Tender to palpation over the peroneal tendon   LOWER EXTREMITY ROM:  Active ROM Right eval Left  eval  Ankle dorsiflexion 10 8  Ankle plantarflexion WNL WNL  Ankle inversion 34 42  Ankle eversion 16 36   (Blank rows = not tested)  LOWER EXTREMITY MMT:  MMT Right eval Left eval  Ankle dorsiflexion 5 5  Ankle plantarflexion 5 5  Ankle inversion 5 5  Ankle eversion 5 5   (Blank rows = not tested)  LOWER EXTREMITY SPECIAL TESTS:  Ankle special tests: Dorsiflexion-Eversion test: positive   FUNCTIONAL TESTS:  5 times sit to stand: 10.56 sec Timed up and go (TUG): 8.00 sec  GAIT: Distance walked: 50 Assistive device utilized: None Level of assistance: Complete Independence Comments: normal  heel to toe progression   TODAY'S TREATMENT:                                                                                                                              DATE: 02/10/23 Nustep x 5 min level 6 Trigger Point Dry-Needling  Treatment instructions: Expect mild to moderate muscle soreness. S/S of pneumothorax if dry needled over a lung field, and to seek immediate medical attention should they occur. Patient verbalized understanding of these instructions and education. Patient Consent Given: Yes Education handout provided: Yes Muscles treated: right LE peroneals Electrical stimulation performed: No Parameters: N/A Treatment response/outcome: Skilled palpation used to identify taut bands and trigger points.  Once identified, dry needling techniques used to treat these areas.  Twitch response ellicited along with palpable elongation of muscle.  Following treatment, patient reported "not sure yet" when asked if she felt less tension in that area of her leg.    Ionto patch #2 with 6 hour wearing time to right lateral ankle.  DATE: 02/03/23 Nustep x 5 min level 6 Hip matrix : abduction and extension 2 x 10 each with 30 lbs Lateral band walks x 3 laps of approx 15 feet Attempted Blue loop around knees : step up and over x 20 using 6 inch step (right ankle pain worsened) Standing rocker board x 2 min Inversion/Eversion on half roll x 1 min Ionto patch #1 with 6 hour wearing time to right lateral ankle.   DATE: 8/29 Nustep x 5 min level 6 Rocker board x 2 min Blue loop around knees : step up and over x 20 using 6 inch step SLS cone taps 3 x 10 each (cone on upright 6" step) 3 direction ball toss 20 x each LE (used kick ball on wall) Inversion/Eversion on half roll x 1 min Standing hip ER/abd with yellow loop around knees in slightly squatted position Lateral band walks x 3 laps of approx 15 feet Seated clam x 20 with yellow loop Side lying clam with yellow loop x 20 each  DATE:  7/25  Initial eval completed  Inititated HEP   PATIENT EDUCATION:  Education details: Initiated HEP Person educated: Patient Education method: Programmer, multimedia, Facilities manager, Verbal cues, and Handouts Education comprehension: verbalized understanding, returned demonstration, and verbal cues required  HOME EXERCISE PROGRAM: Access Code: [email or web address] URL: [email or web address] Date: 12/23/2022 Prepared by: Mikey Kirschner  Exercises - Seated Ankle Inversion Eversion PROM  - 1 x daily - 7 x weekly - 3 sets - 10 reps - Single Leg Stance  - 1 x daily - 7 x weekly - 3 sets - 10 reps - Lateral Ankle Scar Massage  - 1 x daily - 7 x weekly - 1 sets - 1 reps - 2 min hold  ASSESSMENT:  CLINICAL IMPRESSION: Cambre continues to experience pain in the lateral right lower leg and ankle.  Edema persists despite compression garment wearing.  She has not been to the suggested location to be evaluated for orthotics.  She understands this would be recommended.  We did some dry needling today to the peroneals to improve muscle elongation.  She will also have a f/u with MD soon.  She is independent with an advanced HEP.  We will continue to address hip and ankle stability with skilled PT depending on her status next visit.       OBJECTIVE IMPAIRMENTS: difficulty walking, decreased ROM, increased fascial restrictions, increased muscle spasms, impaired flexibility, and pain.   ACTIVITY LIMITATIONS: standing, squatting, and sleeping  PARTICIPATION LIMITATIONS: meal prep, cleaning, laundry, shopping, community activity, yard work, and church  PERSONAL FACTORS: 1-2 comorbidities: OA, Htn  are also affecting patient's functional outcome.   REHAB POTENTIAL: Good  CLINICAL DECISION MAKING: Stable/uncomplicated  EVALUATION COMPLEXITY: Low   GOALS: Goals reviewed with patient? Yes  SHORT TERM GOALS: Target date: 01/20/2023   Pain report to be no greater than 3/10  Baseline: Goal status: MET  01/05/23  2.  Patient will be independent with initial HEP  Baseline:  Goal status: MET 01/05/23  LONG TERM GOALS: Target date: 02/17/2023   Patient to report pain no greater than 1/10  Baseline:  Goal status: INITIAL  2.  Patient to be independent with advanced HEP  Baseline:  Goal status: MET 02/10/23  3.  Patient able to maintain SLS x 20 sec on floor Baseline:  Goal status: MET 02/10/23  4.  Patient to be able to sleep through the night  Baseline:  Goal status: In Progress  5.  Patient to be able to do all yoga poses without modification Baseline:  Goal status: In progress  6.  FOTO score to be 73 Baseline: 66 Goal status: INITIAL   PLAN:  PT FREQUENCY: 1x/week  PT DURATION: 8 weeks  PLANNED INTERVENTIONS: Therapeutic exercises, Therapeutic activity, Neuromuscular re-education, Balance training, Gait training, Patient/Family education, Self Care, Joint mobilization, Stair training, Orthotic/Fit training, Aquatic Therapy, Dry Needling, Electrical stimulation, Cryotherapy, Moist heat, Compression bandaging, Splintting, Taping, Vasopneumatic device, Ultrasound, Ionotophoresis 4mg /ml Dexamethasone, Manual therapy, and Re-evaluation  PLAN FOR NEXT SESSION: Held on any stability or aggressive ankle exercises due to patient c/o lateral ankle pain.  Patient encouraged to be evaluated for orthotic.  If patient pain level is well controlled next visit, progress glut medius and abductor strengthening,  STM, cross friction, possible IASTM, short foot, ankle stability training.  Ice or game ready if needed.  DN if needed.   Victorino Dike B. Axzel [place]hill, PT 02/10/23 10:25 AM [facility] [address] [place], [place] [address] Phone # [phone] Fax [phone]

## 2023-02-17 ENCOUNTER — Other Ambulatory Visit (HOSPITAL_COMMUNITY): Payer: Self-pay | Admitting: Family Medicine

## 2023-02-17 ENCOUNTER — Ambulatory Visit: Payer: PPO

## 2023-02-17 DIAGNOSIS — E782 Mixed hyperlipidemia: Secondary | ICD-10-CM | POA: Diagnosis not present

## 2023-02-17 DIAGNOSIS — M79605 Pain in left leg: Secondary | ICD-10-CM

## 2023-02-17 DIAGNOSIS — Z6830 Body mass index (BMI) 30.0-30.9, adult: Secondary | ICD-10-CM | POA: Diagnosis not present

## 2023-02-17 DIAGNOSIS — G720 Drug-induced myopathy: Secondary | ICD-10-CM | POA: Diagnosis not present

## 2023-02-17 DIAGNOSIS — R293 Abnormal posture: Secondary | ICD-10-CM

## 2023-02-17 DIAGNOSIS — R262 Difficulty in walking, not elsewhere classified: Secondary | ICD-10-CM

## 2023-02-17 DIAGNOSIS — R252 Cramp and spasm: Secondary | ICD-10-CM

## 2023-02-17 DIAGNOSIS — M25571 Pain in right ankle and joints of right foot: Secondary | ICD-10-CM

## 2023-02-17 DIAGNOSIS — T466X5A Adverse effect of antihyperlipidemic and antiarteriosclerotic drugs, initial encounter: Secondary | ICD-10-CM | POA: Diagnosis not present

## 2023-02-17 DIAGNOSIS — M79604 Pain in right leg: Secondary | ICD-10-CM

## 2023-02-17 DIAGNOSIS — M25671 Stiffness of right ankle, not elsewhere classified: Secondary | ICD-10-CM

## 2023-02-17 DIAGNOSIS — M6281 Muscle weakness (generalized): Secondary | ICD-10-CM

## 2023-02-17 NOTE — Therapy (Addendum)
OUTPATIENT PHYSICAL THERAPY LOWER EXTREMITY TREATMENT NOTE PHYSICAL THERAPY DISCHARGE SUMMARY  Visits from Start of Care: 8  Current functional level related to goals / functional outcomes: See below   Remaining deficits: See below   Education / Equipment: See below   Patient agrees to discharge. Patient goals were met. Patient is being discharged due to meeting the stated rehab goals.    Patient Name: Jamie Glover MRN: [id number] DOB:August 27, 1955, 67 y.o., female Today's Date: 02/17/2023  END OF SESSION:  PT End of Session - 02/17/23 0848     Visit Number 8    Date for PT Re-Evaluation 02/17/23    Authorization Type [employer]    PT Start Time 0847    PT Stop Time 0925    PT Time Calculation (min) 38 min    Activity Tolerance Patient tolerated treatment well    Behavior During Therapy [facility] for tasks assessed/performed             Past Medical History:  Diagnosis Date   Arthritis    Hypertension    OSA (obstructive sleep apnea) 2013   Past Surgical History:  Procedure Laterality Date   BREAST BIOPSY Right    c-sections     CHOLECYSTECTOMY     CHOLECYSTECTOMY, LAPAROSCOPIC  1996   right knee surgery     Patient Active Problem List   Diagnosis Date Noted   Peroneal tendonitis, right 09/15/2021   Unilateral primary osteoarthritis, left knee 04/14/2020   Primary osteoarthritis of left knee 01/15/2019   Unilateral primary osteoarthritis, right knee 12/26/2017   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 ear 12/19/2017   Eustachian tube dysfunction, left 12/19/2017   Chronic serous otitis media of left ear 12/19/2017   Tinnitus 10/01/2011   Vitamin D deficiency 10/01/2011   GERD (gastroesophageal reflux disease) 10/01/2011   Neck pain 10/01/2011   HTN (hypertension) 10/01/2011   Sleep apnea 09/29/2011   BMI 33.0-33.9,adult 09/29/2011    PCP: Clayborn Heron, MD   REFERRING PROVIDER:  Inez Pilgrim, NP  REFERRING DIAG: M25.571 (ICD-10-CM) - Pain in right ankle and joints of right foot  THERAPY DIAG:  Pain in right ankle and joints of right foot  Stiffness of right ankle, not elsewhere classified  Difficulty in walking, not elsewhere classified  Muscle weakness (generalized)  Cramp and spasm  Pain in right leg  Pain in left leg  Abnormal posture  Rationale for Evaluation and Treatment: Rehabilitation  ONSET DATE: 12/16/2022  SUBJECTIVE:   SUBJECTIVE STATEMENT: Patient reports right foot pain has subsided since last visit. "I got my orthotics and I am wearing those.  I also had some Rx anti inflammatory that I took as well as using my compression sleeve"  No pain today.         PERTINENT HISTORY: na PAIN:  02/17/23 Are you having pain?  0/10   PRECAUTIONS: None  RED FLAGS: None   WEIGHT BEARING RESTRICTIONS: No  FALLS:  Has patient fallen in last 6 months? No  LIVING ENVIRONMENT: Lives with: lives with their family Lives in: House/apartment   OCCUPATION: Retired  PLOF: Independent  PATIENT GOALS: Not having to avoid stances in yoga and to avoid waking up at night  NEXT MD VISIT: prn  OBJECTIVE:   DIAGNOSTIC FINDINGS: na  PATIENT SURVEYS:   Eval: FOTO 66, predicted 73  02/17/23: FOTO: 67  COGNITION: Overall cognitive status: Within functional limits for tasks assessed  SENSATION: WFL  POSTURE:  slight knee valgus  PALPATION: Tender to palpation over the peroneal tendon   LOWER EXTREMITY ROM:  Active ROM Right eval Right 02/17/23 Left eval Left 02/17/23  Ankle dorsiflexion 10 12 8 10   Ankle plantarflexion WNL WNL WNL WNL  Ankle inversion 34 36 42 42  Ankle eversion 16 40 36 40   (Blank rows = not tested)  LOWER EXTREMITY MMT:  MMT Right eval Left eval  Ankle dorsiflexion 5 5  Ankle plantarflexion 5 5  Ankle inversion 5 5  Ankle eversion 5 5   (Blank rows = not tested)  LOWER EXTREMITY SPECIAL  TESTS:  Ankle special tests: Dorsiflexion-Eversion test: positive   FUNCTIONAL TESTS:   Eval: 5 times sit to stand: 10.56 sec Timed up and go (TUG): 8.00 sec  02/17/23: 5 times sit to stand: 9.56 sec Timed up and go (TUG): 6.91 sec  GAIT: Distance walked: 50 Assistive device utilized: None Level of assistance: Complete Independence Comments: normal heel to toe progression   TODAY'S TREATMENT:                                                                                                                              DATE: 02/17/23 Re-assessment for DC Educated on how to progress HEP and how to manage postural issues.  Emphasis on avoiding valgus position on transitions from sit to stand and when walking.  Had patient stand in front of mirror for visual feed back to remind her of exactly what she needs to focus on.  Discussed timing and appropriateness of knee replacement on right knee and how kinetic chain will be affected over time due to the severity of the angle of the right knee.   Reviewed HEP  DATE: 02/10/23 Nustep x 5 min level 6 Trigger Point Dry-Needling  Treatment instructions: Expect mild to moderate muscle soreness. S/S of pneumothorax if dry needled over a lung field, and to seek immediate medical attention should they occur. Patient verbalized understanding of these instructions and education. Patient Consent Given: Yes Education handout provided: Yes Muscles treated: right LE peroneals Electrical stimulation performed: No Parameters: N/A Treatment response/outcome: Skilled palpation used to identify taut bands and trigger points.  Once identified, dry needling techniques used to treat these areas.  Twitch response ellicited along with palpable elongation of muscle.  Following treatment, patient reported "not sure yet" when asked if she felt less tension in that area of her leg.    Ionto patch #2 with 6 hour wearing time to right lateral ankle.  DATE: 02/03/23 Nustep x  5 min level 6 Hip matrix : abduction and extension 2 x 10 each with 30 lbs Lateral band walks x 3 laps of approx 15 feet Attempted Blue loop around knees : step up and over x 20 using 6 inch step (right ankle pain worsened) Standing rocker board x 2 min Inversion/Eversion on half roll x 1 min Ionto patch #1 with  6 hour wearing time to right lateral ankle.   DATE: 8/29 Nustep x 5 min level 6 Rocker board x 2 min Blue loop around knees : step up and over x 20 using 6 inch step SLS cone taps 3 x 10 each (cone on upright 6" step) 3 direction ball toss 20 x each LE (used kick ball on wall) Inversion/Eversion on half roll x 1 min Standing hip ER/abd with yellow loop around knees in slightly squatted position Lateral band walks x 3 laps of approx 15 feet Seated clam x 20 with yellow loop Side lying clam with yellow loop x 20 each  DATE: 7/25  Initial eval completed  Inititated HEP   PATIENT EDUCATION:  Education details: Initiated HEP Person educated: Patient Education method: Programmer, multimedia, Facilities manager, Verbal cues, and Handouts Education comprehension: verbalized understanding, returned demonstration, and verbal cues required  HOME EXERCISE PROGRAM: Access Code: [email or web address] URL: [email or web address] Date: 12/23/2022 Prepared by: Mikey Kirschner  Exercises - Seated Ankle Inversion Eversion PROM  - 1 x daily - 7 x weekly - 3 sets - 10 reps - Single Leg Stance  - 1 x daily - 7 x weekly - 3 sets - 10 reps - Lateral Ankle Scar Massage  - 1 x daily - 7 x weekly - 1 sets - 1 reps - 2 min hold  ASSESSMENT:  CLINICAL IMPRESSION: Eldora's ankle pain subsided since last visit.  She has met all goals and is very aware of how to manage her condition.  She understands that the valgus angle of her right knee will likely continue to have an affect on the kinetic chain issues she has experienced.  She is compliant and well motivated.  She should continue to do well.  We will DC at  this time.        OBJECTIVE IMPAIRMENTS: difficulty walking, decreased ROM, increased fascial restrictions, increased muscle spasms, impaired flexibility, and pain.   ACTIVITY LIMITATIONS: standing, squatting, and sleeping  PARTICIPATION LIMITATIONS: meal prep, cleaning, laundry, shopping, community activity, yard work, and church  PERSONAL FACTORS: 1-2 comorbidities: OA, Htn  are also affecting patient's functional outcome.   REHAB POTENTIAL: Good  CLINICAL DECISION MAKING: Stable/uncomplicated  EVALUATION COMPLEXITY: Low   GOALS: Goals reviewed with patient? Yes  SHORT TERM GOALS: Target date: 01/20/2023   Pain report to be no greater than 3/10  Baseline: Goal status: MET 01/05/23  2.  Patient will be independent with initial HEP  Baseline:  Goal status: MET 01/05/23  LONG TERM GOALS: Target date: 02/17/2023   Patient to report pain no greater than 1/10  Baseline:  Goal status: MET 02/17/23  2.  Patient to be independent with advanced HEP  Baseline:  Goal status: MET 02/10/23  3.  Patient able to maintain SLS x 20 sec on floor Baseline:  Goal status: MET 02/10/23  4.  Patient to be able to sleep through the night  Baseline:  Goal status: MET 02/17/23  5.  Patient to be able to do all yoga poses without modification Baseline:  Goal status: MET 02/17/23  6.  FOTO score to be 73 Baseline: 66 Goal status: NOT MET (running questions skew results)   PLAN:  PT FREQUENCY: 1x/week  PT DURATION: 8 weeks  PLANNED INTERVENTIONS: Therapeutic exercises, Therapeutic activity, Neuromuscular re-education, Balance training, Gait training, Patient/Family education, Self Care, Joint mobilization, Stair training, Orthotic/Fit training, Aquatic Therapy, Dry Needling, Electrical stimulation, Cryotherapy, Moist heat, Compression bandaging, Splintting, Taping, Vasopneumatic device, Ultrasound, Ionotophoresis 4mg /ml Dexamethasone, Manual therapy, and  Re-evaluation  PLAN FOR NEXT  SESSION: DC to continue with HEP  French Kendra B. Damiana Berrian, PT 02/17/23 9:52 AM  [facility] [address] [place], [place] [address] Phone # [phone] Fax [phone]

## 2023-02-22 ENCOUNTER — Ambulatory Visit (HOSPITAL_COMMUNITY)
Admission: RE | Admit: 2023-02-22 | Discharge: 2023-02-22 | Disposition: A | Discharge status: Home / Self Care | Payer: PPO | Source: Ambulatory Visit | Attending: Family Medicine | Admitting: Family Medicine

## 2023-02-22 DIAGNOSIS — E782 Mixed hyperlipidemia: Secondary | ICD-10-CM | POA: Insufficient documentation

## 2023-02-24 ENCOUNTER — Ambulatory Visit: Payer: PPO

## 2023-03-21 DIAGNOSIS — H2513 Age-related nuclear cataract, bilateral: Secondary | ICD-10-CM | POA: Diagnosis not present

## 2023-04-06 DIAGNOSIS — J0140 Acute pansinusitis, unspecified: Secondary | ICD-10-CM | POA: Diagnosis not present

## 2023-04-06 DIAGNOSIS — H60502 Unspecified acute noninfective otitis externa, left ear: Secondary | ICD-10-CM | POA: Diagnosis not present

## 2023-04-11 DIAGNOSIS — H6505 Acute serous otitis media, recurrent, left ear: Secondary | ICD-10-CM | POA: Diagnosis not present

## 2023-04-11 DIAGNOSIS — J0100 Acute maxillary sinusitis, unspecified: Secondary | ICD-10-CM | POA: Diagnosis not present

## 2023-04-25 DIAGNOSIS — H903 Sensorineural hearing loss, bilateral: Secondary | ICD-10-CM | POA: Diagnosis not present

## 2023-04-25 DIAGNOSIS — H73892 Other specified disorders of tympanic membrane, left ear: Secondary | ICD-10-CM | POA: Diagnosis not present

## 2023-04-25 DIAGNOSIS — H6992 Unspecified Eustachian tube disorder, left ear: Secondary | ICD-10-CM | POA: Diagnosis not present

## 2023-06-20 IMAGING — MR MR ANKLE*R* W/O CM
6 series · 40 of 40 positions shown · non-contrast
Comparison: X-ray 07/14/2021

CLINICAL DATA: Ankle pain, tendon abnormality suspected, neg xray.
Lateral ankle pain

EXAM:
MRI OF THE RIGHT ANKLE WITHOUT CONTRAST
TECHNIQUE: Multiplanar, multisequence MR imaging of the ankle was performed. No
intravenous contrast was administered.

[Series 4: T2 fat-sat · axial · 3.0mm · 0.50mm/px · z∈[-50,+70]mm · 8 of 32 slices shown (1 of 2)]
[im 1/32]
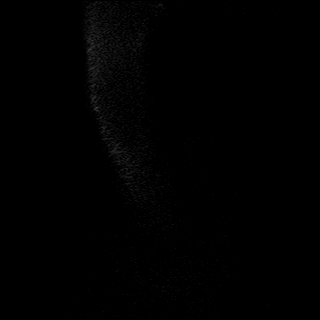
[im 5/32]
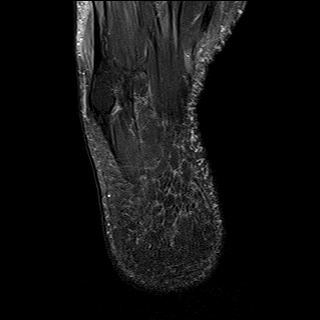
[im 9/32]
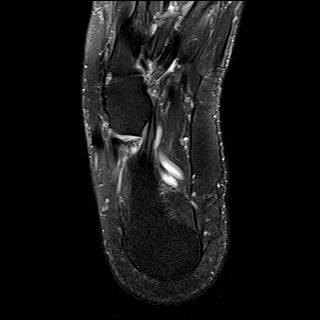
[im 14/32]
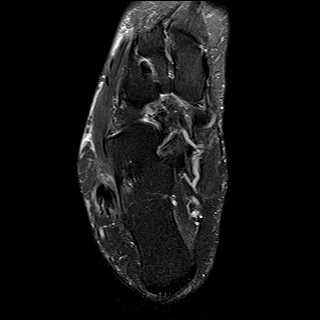
[im 18/32]
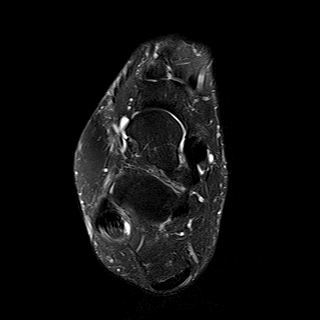
[im 23/32]
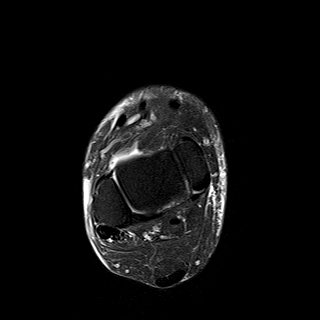
[im 27/32]
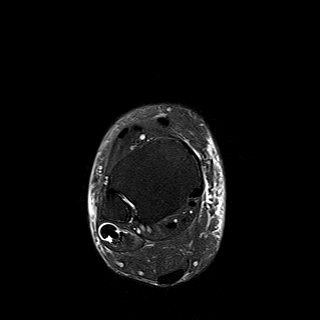
[im 32/32]
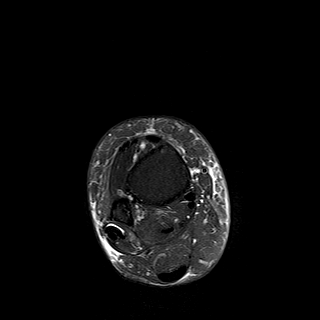

[Series 5: PD fat-sat · axial · 3.0mm · 0.50mm/px · z∈[-50,+70]mm · 7 of 32 slices shown]
[im 1/32]
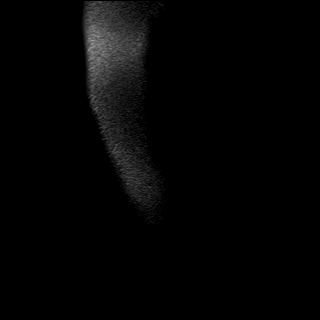
[im 6/32]
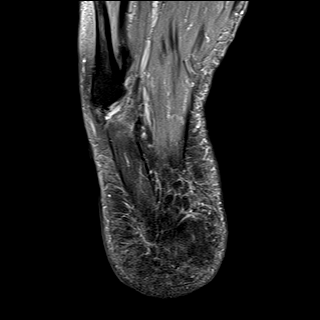
[im 11/32]
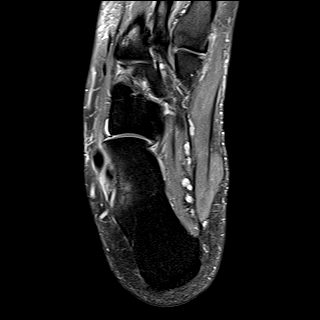
[im 16/32]
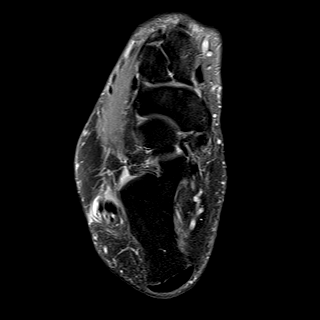
[im 21/32]
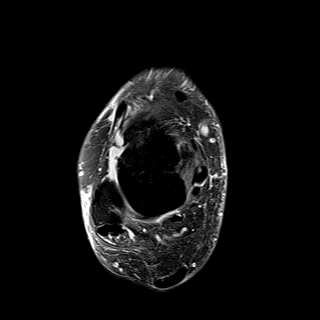
[im 26/32]
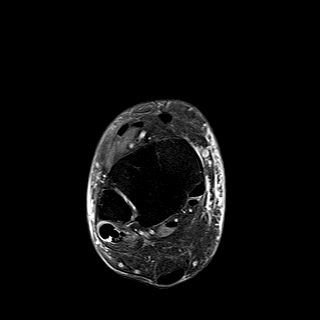
[im 32/32]
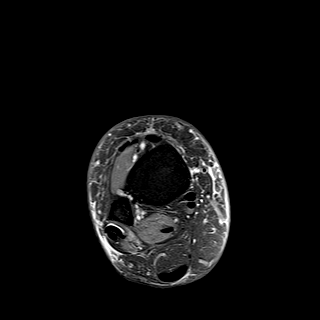

[Series 6: T1 · sagittal · 4.0mm · 0.56mm/px · 5 of 22 slices shown (1 of 2)]
[im 1/22]
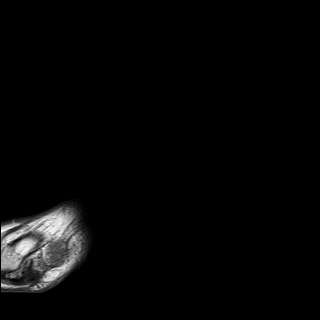
[im 6/22]
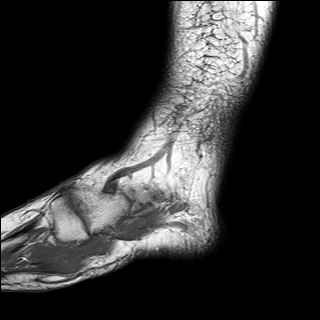
[im 11/22]
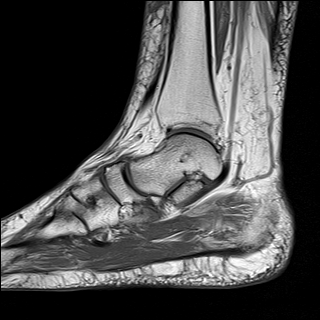
[im 16/22]
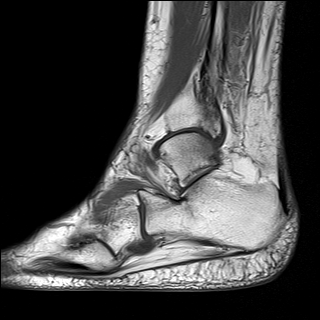
[im 22/22]
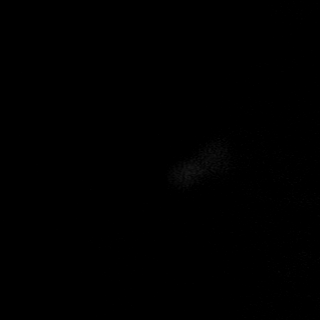

[Series 8: STIR · sagittal · 4.0mm · 0.35mm/px · 5 of 22 slices shown]
[im 1/22]
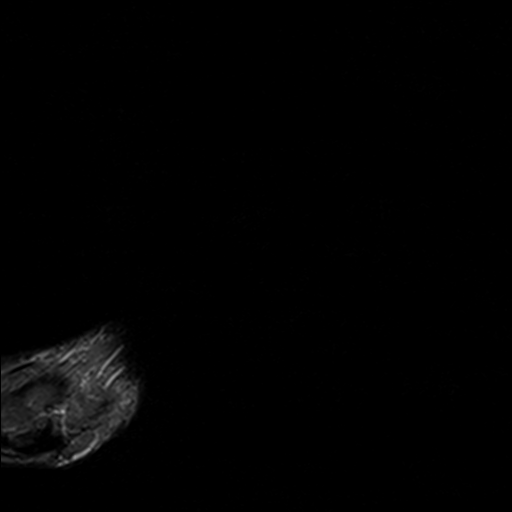
[im 6/22]
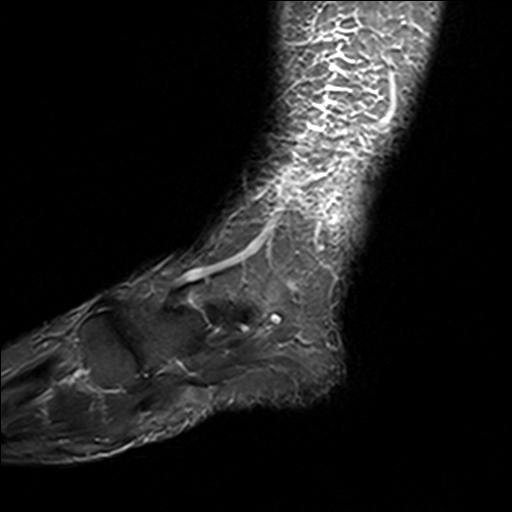
[im 11/22]
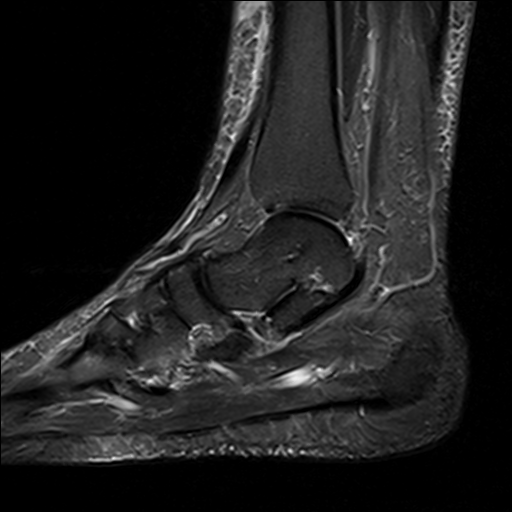
[im 16/22]
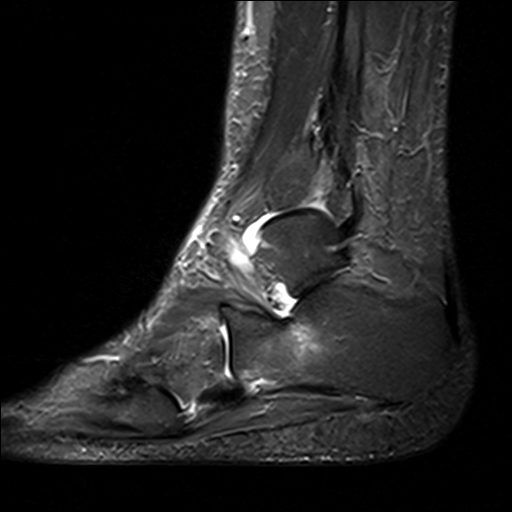
[im 22/22]
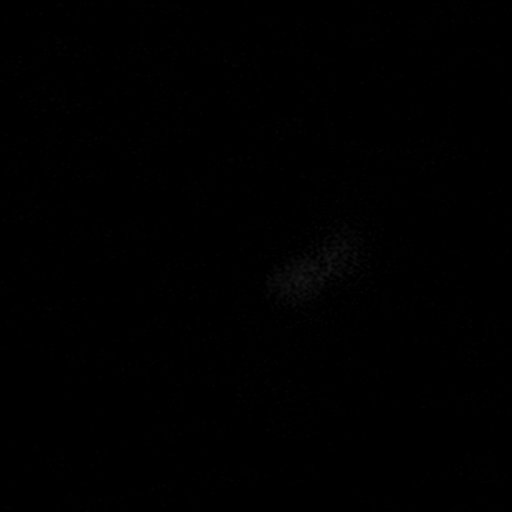

[Series 9: T2 fat-sat · coronal · 3.0mm · 0.50mm/px · 8 of 35 slices shown (2 of 2)]
[im 1/35]
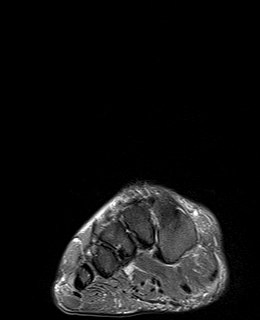
[im 5/35]
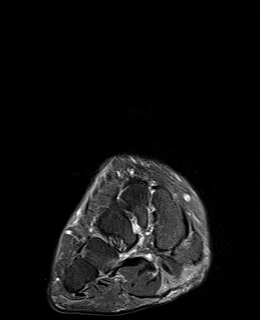
[im 10/35]
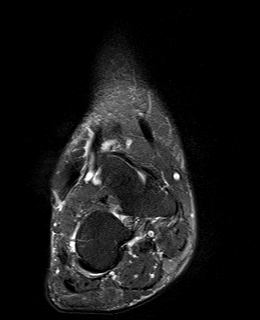
[im 15/35]
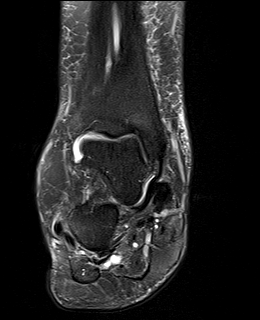
[im 20/35]
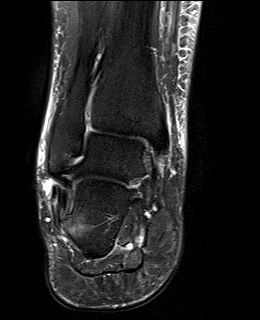
[im 25/35]
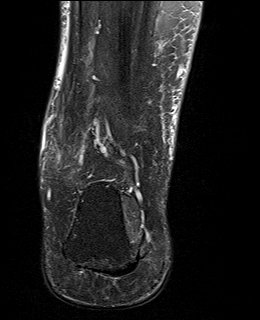
[im 30/35]
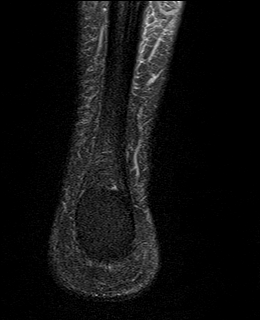
[im 35/35]
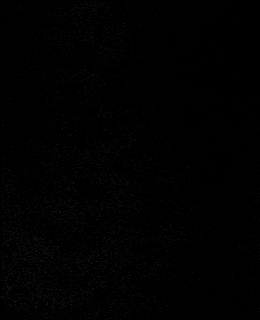

[Series 10: T1 · axial · 3.0mm · 0.50mm/px · z∈[-50,+70]mm · 7 of 32 slices shown (2 of 2)]
[im 1/32]
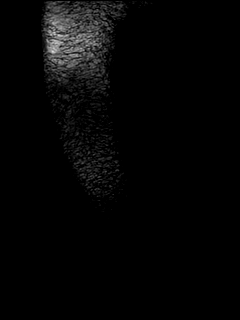
[im 6/32]
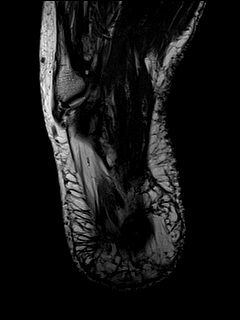
[im 11/32]
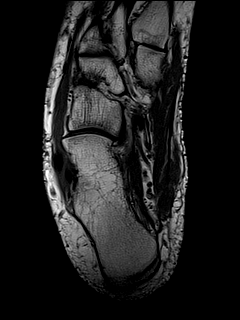
[im 16/32]
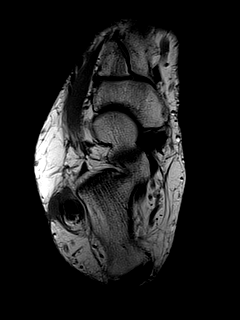
[im 21/32]
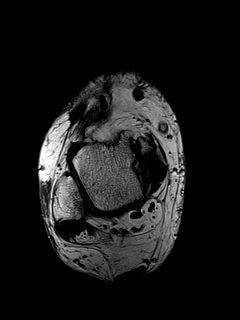
[im 26/32]
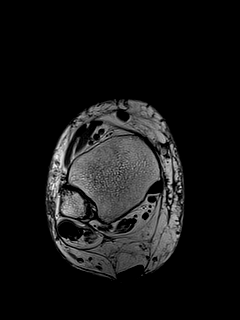
[im 32/32]
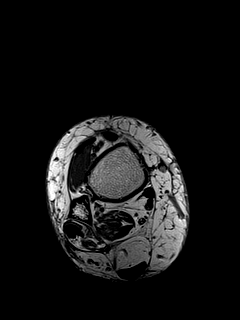

[40 of 40 positions shown; findings below may reference images not displayed]

FINDINGS: TENDONS

Peroneal: Severe tendinosis of the retro-malleolar and
infra-malleolar aspect of the peroneus brevis tendon with
partial-thickness longitudinal split tear. Mild tendinosis of the
inframalleolar peroneus longus tendon. Small volume tenosynovitis.

Posteromedial: Tibialis posterior, flexor hallucis longus, and
flexor digitorum longus tendons are intact and normally positioned.

Anterior: Tibialis anterior, extensor hallucis longus, and extensor
digitorum longus tendons are intact and normally positioned.

Achilles: Intact.

Plantar Fascia: Intact.

LIGAMENTS

Lateral: Intact tibiofibular ligaments. The anterior and posterior
talofibular ligaments are intact. Intact calcaneofibular ligament.

Medial: The deltoid and visualized portions of the spring ligament
appear intact.

CARTILAGE AND BONES

Ankle Joint: No significant ankle joint effusion. The talar dome and
tibial plafond are intact.

Subtalar Joints/Sinus Tarsi: No cartilage defect. No effusion.
Preservation of the anatomic fat within the sinus tarsi.

Bones: No acute fracture. No malalignment. Focal bone marrow edema
within the lateral aspect of the calcaneal body just beyond the
level of the peroneal tubercle (series 4, image 21). No suspicious
bone lesion.

Other: Mild circumferential subcutaneous edema of the ankle.
IMPRESSION: 1. Severe tendinosis of the peroneus brevis tendon with
partial-thickness longitudinal split tear. Mild tendinosis of the
inframalleolar peroneus longus tendon. Small volume tenosynovitis.
2. Focal bone marrow edema within the lateral aspect of the
calcaneal body just beyond the level of the peroneal tubercle. This
is likely reactive secondary to adjacent peroneal tendon pathology.

## 2023-06-20 IMAGING — MR MR FOOT*R* W/O CM
4 of 5 series · 17 of 40 positions shown · non-contrast
Comparison: Right ankle x-ray 07/14/2021

CLINICAL DATA: Foot pain, chronic, osseous injury suspected

EXAM:
MRI OF THE RIGHT FOREFOOT WITHOUT CONTRAST
TECHNIQUE: Multiplanar, multisequence MR imaging of the right forefoot was
performed. No intravenous contrast was administered.

[Series 2: T1 · coronal · 3.0mm · 0.19mm/px · 3 of 44 slices shown (1 of 2)]
[im 8/44]
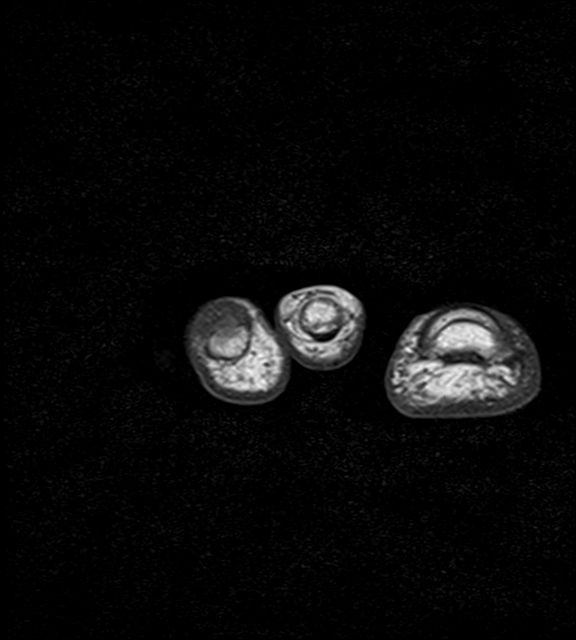
[im 24/44]
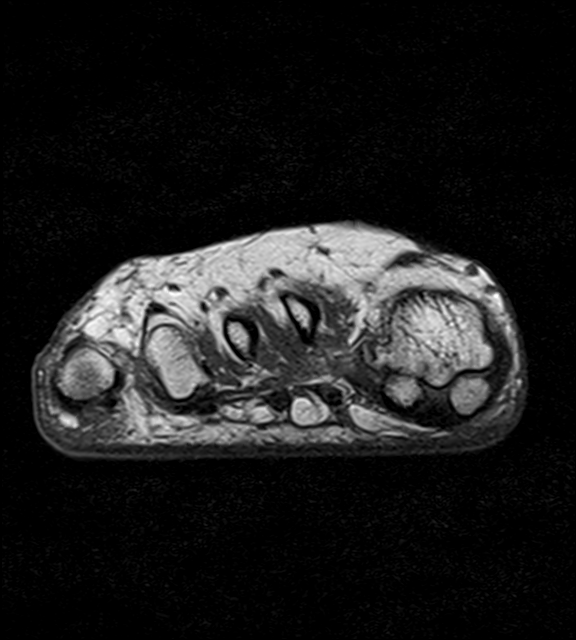
[im 40/44]
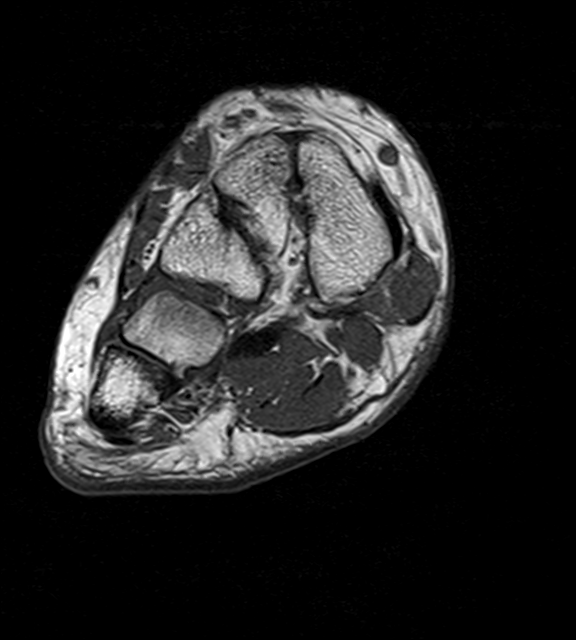

[Series 3: T2 fat-sat · coronal · 3.0mm · 0.19mm/px · 8 of 44 slices shown (1 of 2)]
[im 1/44]
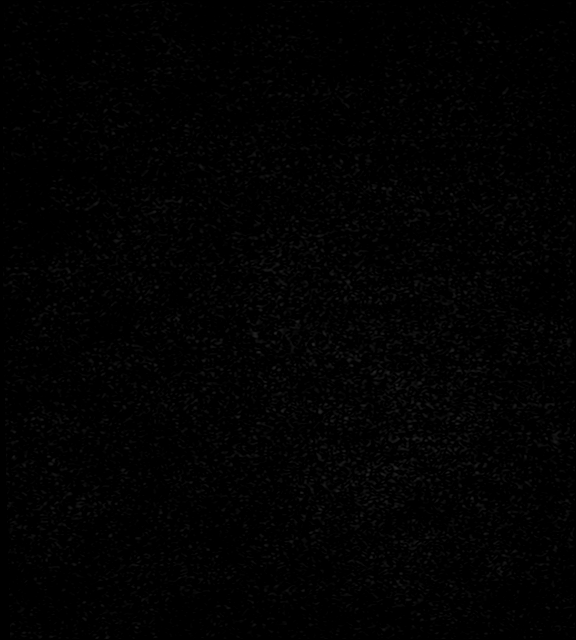
[im 5/44]
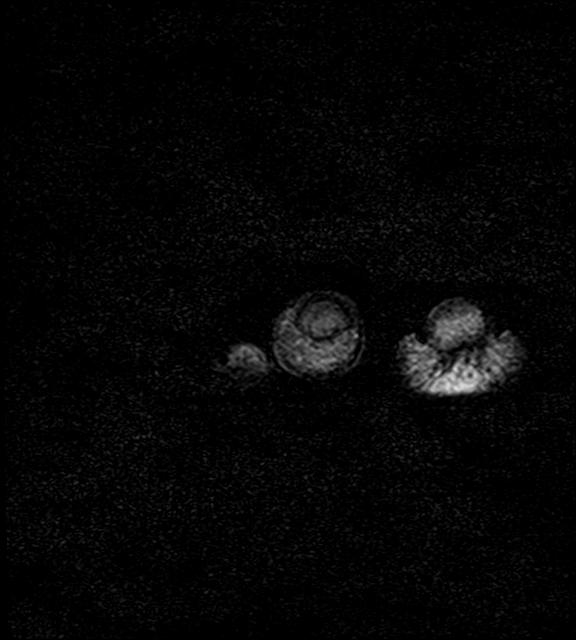
[im 9/44]
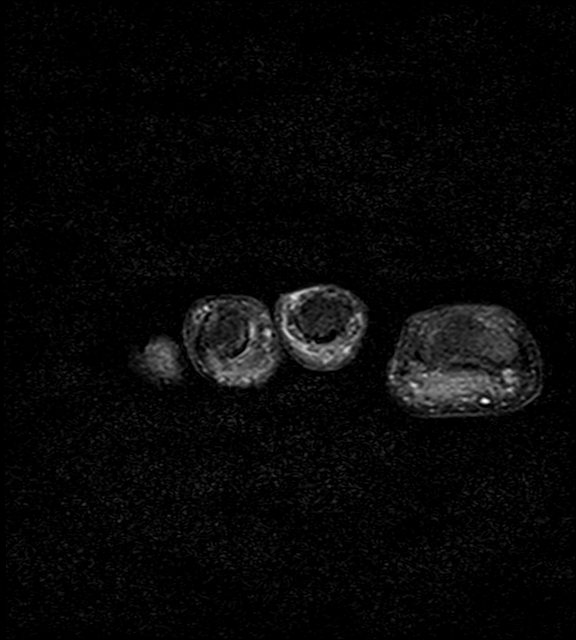
[im 13/44]
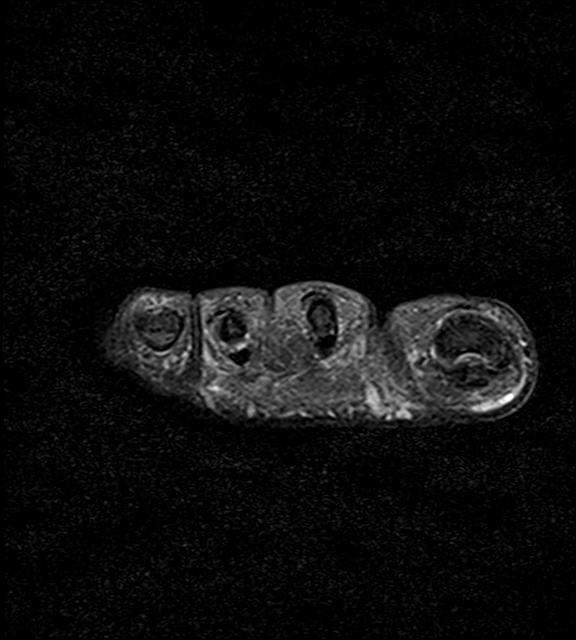
[im 18/44]
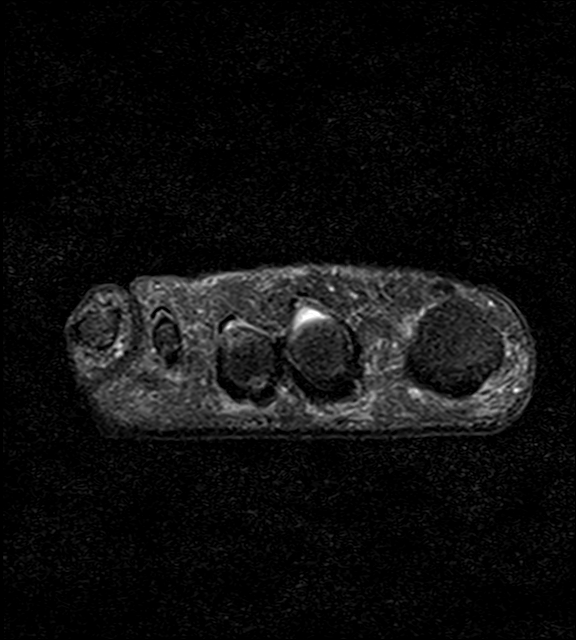
[im 22/44]
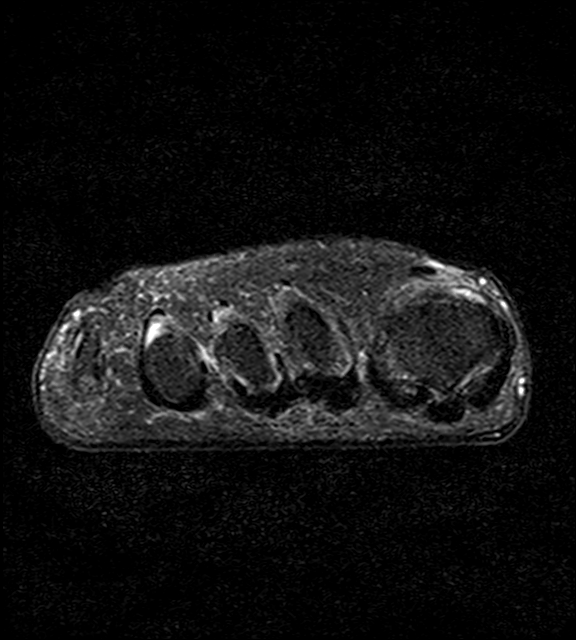
[im 26/44]
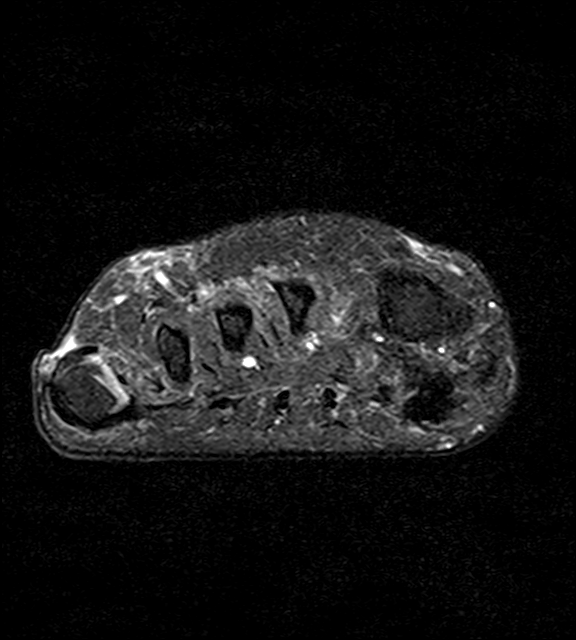
[im 39/44]
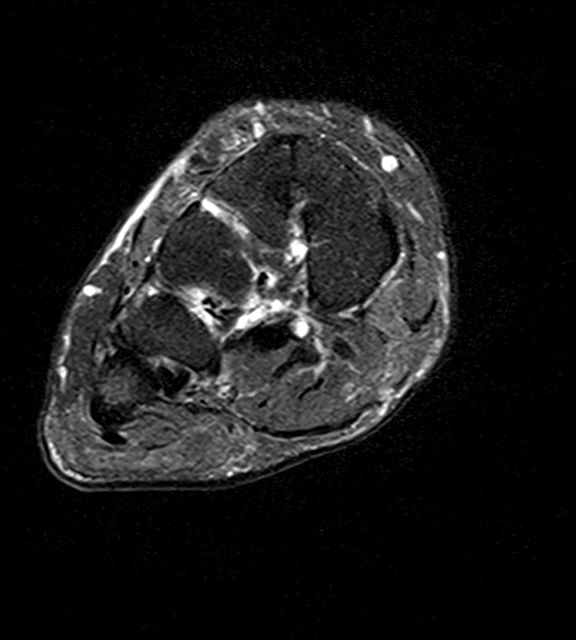

[Series 5: T2 fat-sat · axial · 3.0mm · 0.35mm/px · z∈[-107,-29]mm · 3 of 22 slices shown (2 of 2)]
[im 1/22]
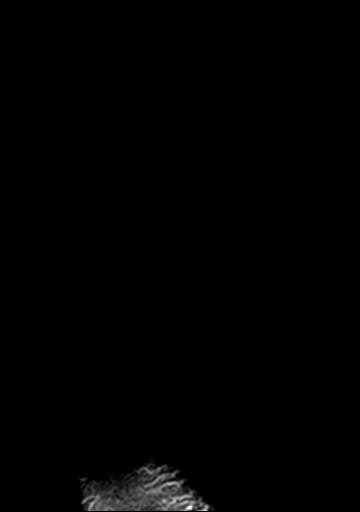
[im 11/22]
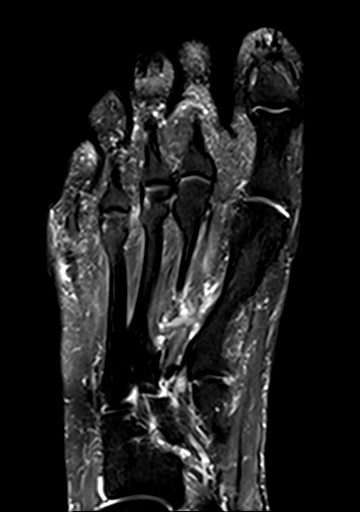
[im 22/22]
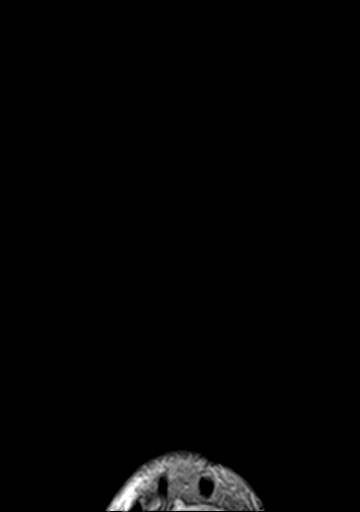

[Series 6: T1 · axial · 3.0mm · 0.35mm/px · z∈[-107,-29]mm · 3 of 22 slices shown (2 of 2)]
[im 1/22]
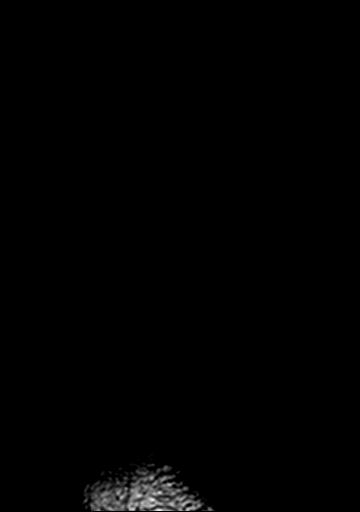
[im 11/22]
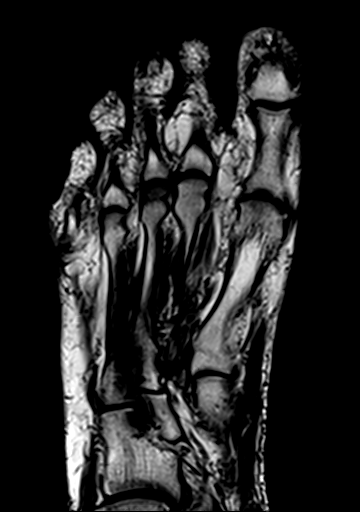
[im 22/22]
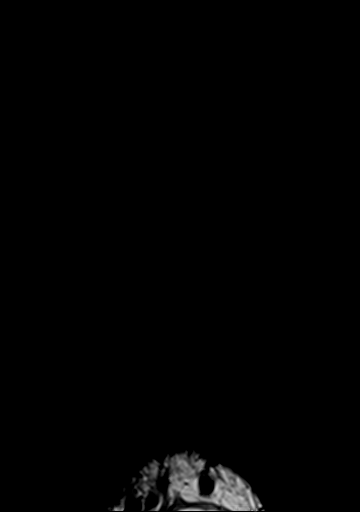

[17 of 40 positions shown; findings below may reference images not displayed]

FINDINGS: Bones/Joint/Cartilage

No acute fracture. No malalignment. Minimal osteoarthritis of the
first MTP joint with tiny marginal osteophytes. No erosion. No joint
effusion. Mild bone marrow edema within the third metatarsal base,
nonspecific. No additional sites of bone marrow edema.

Ligaments

Intact Lisfranc ligament. Collateral ligaments of the forefoot are
intact.

Muscles and Tendons

Intact flexor and extensor tendons without tendinosis, tear, or
tenosynovitis. Normal muscle bulk and signal intensity without
edema, atrophy, or fatty infiltration.

Soft tissues

No solid or cystic mass. No fluid collection. Mild subcutaneous
edema involving the lateral aspect of the dorsal forefoot. No
ulceration.
IMPRESSION: 1. Mild bone marrow edema within the base of the third metatarsal,
nonspecific. Findings may be degenerative or reflect stress related
changes. No fracture.
2. Minimal osteoarthritis of the first MTP joint.
3. Mild subcutaneous edema involving the lateral aspect of the
dorsal forefoot.

## 2023-06-22 ENCOUNTER — Other Ambulatory Visit: Payer: Self-pay | Admitting: Family Medicine

## 2023-06-22 DIAGNOSIS — Z780 Asymptomatic menopausal state: Secondary | ICD-10-CM

## 2023-07-19 DIAGNOSIS — N390 Urinary tract infection, site not specified: Secondary | ICD-10-CM | POA: Diagnosis not present

## 2023-11-28 ENCOUNTER — Other Ambulatory Visit: Payer: Self-pay | Admitting: Family Medicine

## 2023-11-28 DIAGNOSIS — Z1231 Encounter for screening mammogram for malignant neoplasm of breast: Secondary | ICD-10-CM

## 2023-11-28 DIAGNOSIS — Z780 Asymptomatic menopausal state: Secondary | ICD-10-CM | POA: Diagnosis not present

## 2023-11-28 DIAGNOSIS — N958 Other specified menopausal and perimenopausal disorders: Secondary | ICD-10-CM | POA: Diagnosis not present

## 2023-11-28 DIAGNOSIS — M8588 Other specified disorders of bone density and structure, other site: Secondary | ICD-10-CM | POA: Diagnosis not present

## 2023-12-14 ENCOUNTER — Ambulatory Visit
Admission: RE | Admit: 2023-12-14 | Discharge: 2023-12-14 | Disposition: A | Discharge status: Home / Self Care | Source: Ambulatory Visit

## 2023-12-14 DIAGNOSIS — Z1231 Encounter for screening mammogram for malignant neoplasm of breast: Secondary | ICD-10-CM | POA: Diagnosis not present

## 2023-12-20 DIAGNOSIS — R7303 Prediabetes: Secondary | ICD-10-CM | POA: Diagnosis not present

## 2023-12-20 DIAGNOSIS — I10 Essential (primary) hypertension: Secondary | ICD-10-CM | POA: Diagnosis not present

## 2023-12-20 DIAGNOSIS — E782 Mixed hyperlipidemia: Secondary | ICD-10-CM | POA: Diagnosis not present

## 2023-12-23 DIAGNOSIS — I10 Essential (primary) hypertension: Secondary | ICD-10-CM | POA: Diagnosis not present

## 2023-12-23 DIAGNOSIS — N898 Other specified noninflammatory disorders of vagina: Secondary | ICD-10-CM | POA: Diagnosis not present

## 2023-12-23 DIAGNOSIS — Z0000 Encounter for general adult medical examination without abnormal findings: Secondary | ICD-10-CM | POA: Diagnosis not present

## 2023-12-23 DIAGNOSIS — E782 Mixed hyperlipidemia: Secondary | ICD-10-CM | POA: Diagnosis not present

## 2023-12-23 DIAGNOSIS — M25361 Other instability, right knee: Secondary | ICD-10-CM | POA: Diagnosis not present

## 2023-12-23 DIAGNOSIS — M6281 Muscle weakness (generalized): Secondary | ICD-10-CM | POA: Diagnosis not present

## 2024-01-17 ENCOUNTER — Ambulatory Visit: Attending: Family Medicine

## 2024-01-17 ENCOUNTER — Other Ambulatory Visit: Payer: Self-pay

## 2024-01-17 DIAGNOSIS — R262 Difficulty in walking, not elsewhere classified: Secondary | ICD-10-CM | POA: Insufficient documentation

## 2024-01-17 DIAGNOSIS — M25661 Stiffness of right knee, not elsewhere classified: Secondary | ICD-10-CM | POA: Insufficient documentation

## 2024-01-17 DIAGNOSIS — R293 Abnormal posture: Secondary | ICD-10-CM | POA: Insufficient documentation

## 2024-01-17 DIAGNOSIS — M25561 Pain in right knee: Secondary | ICD-10-CM | POA: Diagnosis not present

## 2024-01-17 DIAGNOSIS — M6281 Muscle weakness (generalized): Secondary | ICD-10-CM | POA: Insufficient documentation

## 2024-01-17 NOTE — Therapy (Signed)
 OUTPATIENT PHYSICAL THERAPY LOWER EXTREMITY EVALUATION   Patient Name: Jamie Glover MRN: [id number] DOB:Aug 20, 1955, 68 y.o., female Today's Date: 01/17/2024  END OF SESSION:  PT End of Session - 01/17/24 1238     Visit Number 1    Date for PT Re-Evaluation 03/13/24    Authorization Type [employer]    PT Start Time 1230    PT Stop Time 1328    PT Time Calculation (min) 58 min    Activity Tolerance Patient tolerated treatment well    Behavior During Therapy WFL for tasks assessed/performed          Past Medical History:  Diagnosis Date   Arthritis    Hypertension    OSA (obstructive sleep apnea) 2013   Past Surgical History:  Procedure Laterality Date   BREAST BIOPSY Right    c-sections     CHOLECYSTECTOMY     CHOLECYSTECTOMY, LAPAROSCOPIC  1996   right knee surgery     Patient Active Problem List   Diagnosis Date Noted   Peroneal tendonitis, right 09/15/2021   Unilateral primary osteoarthritis, left knee 04/14/2020   Primary osteoarthritis of left knee 01/15/2019   Unilateral primary osteoarthritis, right knee 12/26/2017   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 ear 12/19/2017   Eustachian tube dysfunction, left 12/19/2017   Chronic serous otitis media of left ear 12/19/2017   Tinnitus 10/01/2011   Vitamin D deficiency 10/01/2011   GERD (gastroesophageal reflux disease) 10/01/2011   Neck pain 10/01/2011   HTN (hypertension) 10/01/2011   Sleep apnea 09/29/2011   BMI 33.0-33.9,adult 09/29/2011    PCP: Sharps Lacks, MD  REFERRING PROVIDER: Sharps Lacks, MD  REFERRING DIAG: M25.361 (ICD-10-CM) - Instability of right knee joint  THERAPY DIAG:  Acute pain of right knee - Plan: PT plan of care cert/re-cert  Stiffness of right knee, not elsewhere classified - Plan: PT plan of care cert/re-cert  Muscle weakness (generalized) - Plan: PT plan of care cert/re-cert  Difficulty in walking, not  elsewhere classified - Plan: PT plan of care cert/re-cert  Abnormal posture - Plan: PT plan of care cert/re-cert  Rationale for Evaluation and Treatment: Rehabilitation  ONSET DATE: 12/27/2023  SUBJECTIVE:   SUBJECTIVE STATEMENT: Patient was seen in PT at this clinic in September of last year for right ankle pain and right knee pain.  She reports she had been doing well for about 10 months and then she had 2 falls.  One where she had her hands full and could not see where she was walking and tripped over the pet gate threshold and the other fall was at church when she simply caught her toe on the floor and fell fwd.  She has been having right knee and hip pain since that time.  She wanted to try PT again to see if she could get back to baseline.    PERTINENT HISTORY: Right knee end stage OA with valgus deformity PAIN:  Are you having pain? Yes: NPRS scale: 1/10 right knee Pain location: right knee Pain description: aching Aggravating factors: stairs, inclines Relieving factors: rest, meds  PRECAUTIONS: None  RED FLAGS: None   WEIGHT BEARING RESTRICTIONS: No  FALLS:  Has patient fallen in last 6 months? No  LIVING ENVIRONMENT: Lives with: lives with their spouse Lives in: House/apartment Stairs: Yes: Internal: 17 steps; on right going up and External: 4 steps; on right going up Has following equipment at home: none  OCCUPATION: Retired  PLOF: Independent, Independent with basic ADLs, Independent with household mobility without device, Independent with community mobility without device, Independent with homemaking with ambulation, Independent with gait, and Independent with transfers  PATIENT GOALS: to avoid more invasive treatment   NEXT MD VISIT: none  OBJECTIVE:  Note: Objective measures were completed at Evaluation unless otherwise noted.  DIAGNOSTIC FINDINGS: na  PATIENT SURVEYS:  LEFS  Extreme difficulty/unable (0), Quite a bit of difficulty (1), Moderate  difficulty (2), Little difficulty (3), No difficulty (4) Survey date:  01/17/24  Any of your usual work, housework or school activities 4  2. Usual hobbies, recreational or sporting activities 3  3. Getting into/out of the bath 4  4. Walking between rooms 4  5. Putting on socks/shoes 4  6. Squatting  4  7. Lifting an object, like a bag of groceries from the floor 4  8. Performing light activities around your home 4  9. Performing heavy activities around your home 3  10. Getting into/out of a car 4  11. Walking 2 blocks 4  12. Walking 1 mile 4  13. Going up/down 10 stairs (1 flight) 3  14. Standing for 1 hour 3  15.  sitting for 1 hour 4  16. Running on even ground 3  17. Running on uneven ground 2  18. Making sharp turns while running fast 0  19. Hopping  2  20. Rolling over in bed 4  Score total:  63/80     COGNITION: Overall cognitive status: Within functional limits for tasks assessed     SENSATION: WFL  EDEMA:  Circumferential: Right : Mid patella:         Left Mid patella:       POSTURE: bilateral knee valgus with pivot shift on heel strike on right knee  PALPATION: Moderate to severe crepitus bilateral knees right > left.   LOWER EXTREMITY ROM:  WFL  LOWER EXTREMITY MMT:  Generally 4 to 4+/5,  severe patellar crepitus on resisted knee extension and flexion on right side  LOWER EXTREMITY SPECIAL TESTS:  Knee special tests: Patellafemoral apprehension test: negative and Patellafemoral grind test: positive   FUNCTIONAL TESTS:  5 times sit to stand: 10.56 sec Timed up and go (TUG): 7.58 sec  GAIT: Distance walked: 50 feet Assistive device utilized: None Level of assistance: SBA Comments: antalgic gait with right knee valgus and instability (joint laxity and shifting noticed on heel strike through stance phase)                                                                                                                                TREATMENT DATE:  01/17/24 Initiated HEP and initial eval completed    PATIENT EDUCATION:  Education details: Educated on postural alignment and importance of quad strength and proper foot wear Person educated: Patient Education method: Programmer, multimedia, Facilities manager, Verbal cues, and Handouts Education comprehension: verbalized understanding, returned demonstration, and verbal cues required  HOME EXERCISE PROGRAM: Access Code: [email or web address] URL: [email or web address] Date: 01/17/2024 Prepared by: Delon Haddock  Exercises - Side Stepping with Resistance at Ankles  - 1 x daily - 7 x weekly - 3 sets - 10 reps - Lateral Monster Walk with Squat and Resistance (BKA)  - 1 x daily - 7 x weekly - 3 sets - 10 reps - Hooklying Clamshell with Resistance  - 1 x daily - 7 x weekly - 1 sets - 20 reps - Clamshell with Resistance  - 1 x daily - 7 x weekly - 2 sets - 10 reps - Seated Hip Abduction with Resistance  - 1 x daily - 7 x weekly - 1 sets - 20 reps  ASSESSMENT:  CLINICAL IMPRESSION: Patient is a 68 y.o. female who was seen today for physical therapy evaluation and treatment for right knee pain. She is a previous patient.  We were initially addressing a right ankle issue but we gradually had to begin working on knee issues on same (right side) due to knee valgus and pain.  She presents with increased crepitus from last episode along with what appears to be an increased valgus angle compared to last episode.  She also shows some laxity and shifting on heel strike to stance phase of gait.  We had a detailed discussion last episode and again this episode about TKA and appropriate time to move fwd with this.  We discussed how her valgus angle is worsening and her crepitus is palpably more pronounced from last episode.  She understands that the TKA may be in her near future but would like to see if she can return to baseline of where she was prior to the falling.    OBJECTIVE IMPAIRMENTS: difficulty walking,  decreased ROM, decreased strength, increased fascial restrictions, increased muscle spasms, postural dysfunction, and pain.   ACTIVITY LIMITATIONS: lifting, bending, standing, squatting, sleeping, stairs, transfers, bed mobility, bathing, toileting, dressing, and caring for others  PARTICIPATION LIMITATIONS: meal prep, cleaning, laundry, driving, shopping, community activity, and yard work  PERSONAL FACTORS: Fitness, Past/current experiences, and Time since onset of injury/illness/exacerbation are also affecting patient's functional outcome.   REHAB POTENTIAL: Good  CLINICAL DECISION MAKING: Stable/uncomplicated  EVALUATION COMPLEXITY: Low   GOALS: Goals reviewed with patient? Yes  SHORT TERM GOALS: Target date: 02/14/2024  Pain report to be no greater than 4/10  Baseline: Goal status: INITIAL  2.  Patient will be independent with initial HEP  Baseline:  Goal status: INITIAL   LONG TERM GOALS: Target date: 03/13/2024  Patient to report pain no greater than 2/10  Baseline:  Goal status: INITIAL  2.  Patient to be independent with advanced HEP  Baseline:  Goal status: INITIAL  3.  Patient to be able to stand or walk for at least 15 min without right knee pain  Baseline:  Goal status: INITIAL  4.  Patient to be able to ascend and descend steps without pain or no greater than 2/10  Baseline:  Goal status: INITIAL  5.  Patient to be able to bend, stoop and squat with pain no greater than 2/10  Baseline:  Goal status: INITIAL  6.  LEFS score to be Baseline:  Goal status: INITIAL   PLAN:  PT FREQUENCY: 1-2x/week  PT DURATION: 8 weeks  PLANNED INTERVENTIONS: 97110-Therapeutic exercises, 97530- Therapeutic activity, [id number]- Neuromuscular re-education, 97535- Self Care, [id number]- Manual therapy, [id number]- Gait training, [phone]- Canalith repositioning, [id number]- Aquatic Therapy, [id number]- Splinting, [id number]- Electrical stimulation (unattended), [id number]- Electrical stimulation  (manual),  02983- Vasopneumatic device, [id number]- Ultrasound, 02966- Ionotophoresis 4mg /ml Dexamethasone, Patient/Family education, Balance training, Stair training, Taping, Joint mobilization, DME instructions, Cryotherapy, and Moist heat  PLAN FOR NEXT SESSION: Nustep, review HEP, progress quad strength and knee stability   Terique Kawabata B. Julena Barbour, PT 01/17/24 5:36 PM [facility] [address], [address] [place], [place] [address] Phone # [phone] Fax [phone]

## 2024-02-07 ENCOUNTER — Ambulatory Visit: Attending: Family Medicine | Admitting: Physical Therapy

## 2024-02-07 DIAGNOSIS — M6281 Muscle weakness (generalized): Secondary | ICD-10-CM | POA: Insufficient documentation

## 2024-02-07 DIAGNOSIS — M25661 Stiffness of right knee, not elsewhere classified: Secondary | ICD-10-CM | POA: Insufficient documentation

## 2024-02-07 DIAGNOSIS — R262 Difficulty in walking, not elsewhere classified: Secondary | ICD-10-CM | POA: Insufficient documentation

## 2024-02-07 DIAGNOSIS — R293 Abnormal posture: Secondary | ICD-10-CM | POA: Diagnosis not present

## 2024-02-07 DIAGNOSIS — R252 Cramp and spasm: Secondary | ICD-10-CM | POA: Diagnosis not present

## 2024-02-07 DIAGNOSIS — M25561 Pain in right knee: Secondary | ICD-10-CM | POA: Insufficient documentation

## 2024-02-07 DIAGNOSIS — M79604 Pain in right leg: Secondary | ICD-10-CM | POA: Diagnosis not present

## 2024-02-07 NOTE — Therapy (Signed)
 OUTPATIENT PHYSICAL THERAPY LOWER EXTREMITY PROGRESS NOTE   Patient Name: Jamie Glover MRN: [id number] DOB:05-13-1956, 68 y.o., female Today's Date: 02/07/2024  END OF SESSION:  PT End of Session - 02/07/24 1231     Visit Number 2    Date for PT Re-Evaluation 03/13/24    Authorization Type [employer]    PT Start Time 1231    PT Stop Time 1314    PT Time Calculation (min) 43 min    Activity Tolerance Patient tolerated treatment well          Past Medical History:  Diagnosis Date   Arthritis    Hypertension    OSA (obstructive sleep apnea) 2013   Past Surgical History:  Procedure Laterality Date   BREAST BIOPSY Right    c-sections     CHOLECYSTECTOMY     CHOLECYSTECTOMY, LAPAROSCOPIC  1996   right knee surgery     Patient Active Problem List   Diagnosis Date Noted   Peroneal tendonitis, right 09/15/2021   Unilateral primary osteoarthritis, left knee 04/14/2020   Primary osteoarthritis of left knee 01/15/2019   Unilateral primary osteoarthritis, right knee 12/26/2017   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 ear 12/19/2017   Eustachian tube dysfunction, left 12/19/2017   Chronic serous otitis media of left ear 12/19/2017   Tinnitus 10/01/2011   Vitamin D deficiency 10/01/2011   GERD (gastroesophageal reflux disease) 10/01/2011   Neck pain 10/01/2011   HTN (hypertension) 10/01/2011   Sleep apnea 09/29/2011   BMI 33.0-33.9,adult 09/29/2011    PCP: Sharps Lacks, MD  REFERRING PROVIDER: Sharps Lacks, MD  REFERRING DIAG: M25.361 (ICD-10-CM) - Instability of right knee joint  THERAPY DIAG:  Acute pain of right knee  Stiffness of right knee, not elsewhere classified  Muscle weakness (generalized)  Difficulty in walking, not elsewhere classified  Rationale for Evaluation and Treatment: Rehabilitation  ONSET DATE: 12/27/2023  SUBJECTIVE:   SUBJECTIVE STATEMENT: Pain in knee with descending  stairs 2-3/10, right hip feels like it's coming.  Been doing band side stepping that's hard.  Walked 1 1/2 miles yesterday and it was OK.     EVAL Patient was seen in PT at this clinic in September of last year for right ankle pain and right knee pain.  She reports she had been doing well for about 10 months and then she had 2 falls.  One where she had her hands full and could not see where she was walking and tripped over the pet gate threshold and the other fall was at church when she simply caught her toe on the floor and fell fwd.  She has been having right knee and hip pain since that time.  She wanted to try PT again to see if she could get back to baseline.    PERTINENT HISTORY: Right knee end stage OA with valgus deformity PAIN:  Are you having pain? Yes: NPRS scale: 0/10 right knee Pain location: right knee Pain description: aching Aggravating factors: stairs, inclines Relieving factors: rest, meds  PRECAUTIONS: None  RED FLAGS: None   WEIGHT BEARING RESTRICTIONS: No  FALLS:  Has patient fallen in last 6 months? No  LIVING ENVIRONMENT: Lives with: lives with their spouse Lives in: House/apartment Stairs: Yes: Internal: 17 steps; on right going up and External: 4 steps; on right going up Has following equipment at home: none  OCCUPATION: Retired  PLOF: Independent, Independent with basic ADLs, Independent with household mobility without  device, Independent with community mobility without device, Independent with homemaking with ambulation, Independent with gait, and Independent with transfers  PATIENT GOALS: to avoid more invasive treatment   NEXT MD VISIT: none  OBJECTIVE:  Note: Objective measures were completed at Evaluation unless otherwise noted.  DIAGNOSTIC FINDINGS: na  PATIENT SURVEYS:  LEFS  Extreme difficulty/unable (0), Quite a bit of difficulty (1), Moderate difficulty (2), Little difficulty (3), No difficulty (4) Survey date:  01/17/24  Any of your  usual work, housework or school activities 4  2. Usual hobbies, recreational or sporting activities 3  3. Getting into/out of the bath 4  4. Walking between rooms 4  5. Putting on socks/shoes 4  6. Squatting  4  7. Lifting an object, like a bag of groceries from the floor 4  8. Performing light activities around your home 4  9. Performing heavy activities around your home 3  10. Getting into/out of a car 4  11. Walking 2 blocks 4  12. Walking 1 mile 4  13. Going up/down 10 stairs (1 flight) 3  14. Standing for 1 hour 3  15.  sitting for 1 hour 4  16. Running on even ground 3  17. Running on uneven ground 2  18. Making sharp turns while running fast 0  19. Hopping  2  20. Rolling over in bed 4  Score total:  63/80     COGNITION: Overall cognitive status: Within functional limits for tasks assessed     SENSATION: WFL  EDEMA:  Circumferential: Right : Mid patella:         Left Mid patella:       POSTURE: bilateral knee valgus with pivot shift on heel strike on right knee  PALPATION: Moderate to severe crepitus bilateral knees right > left.   LOWER EXTREMITY ROM:  WFL  LOWER EXTREMITY MMT:  Generally 4 to 4+/5,  severe patellar crepitus on resisted knee extension and flexion on right side  LOWER EXTREMITY SPECIAL TESTS:  Knee special tests: Patellafemoral apprehension test: negative and Patellafemoral grind test: positive   FUNCTIONAL TESTS:  5 times sit to stand: 10.56 sec Timed up and go (TUG): 7.58 sec  GAIT: Distance walked: 50 feet Assistive device utilized: None Level of assistance: SBA Comments: antalgic gait with right knee valgus and instability (joint laxity and shifting noticed on heel strike through stance phase)  TREATMENT DATE: 02/07/24 Nu-Step L5 8 min while discussing status and plan for session Review of initial HEP 4 inch forward step up 10x (mild pain) 4 inch lateral step up 8x (more pain than forward step up)  From 4 inch isometric hold on  right leg, left hip abduction 3 sets of 5  LAQ 5# 3 sec hold 3 sec 10x 2 HS curls green  band 10x SLS standing tall neuromuscular re-education in front of the mirror to activate gluteals Isometric quad against green ball 10 sec holds Pt declines written and pictoral HEP but makes notes on her phone  TREATMENT DATE: 01/17/24 Initiated HEP and initial eval completed    PATIENT EDUCATION:  Education details: Educated on postural alignment and importance of quad strength and proper foot wear Person educated: Patient Education method: Programmer, multimedia, Facilities manager, Verbal cues, and Handouts Education comprehension: verbalized understanding, returned demonstration, and verbal cues required  HOME EXERCISE PROGRAM: Access Code: [email or web address] URL: [email or web address] Date: 01/17/2024 Prepared by: Delon Haddock  Exercises - Side Stepping with Resistance at Ankles  - 1 x daily - 7 x weekly - 3 sets - 10 reps - Lateral Monster Walk with Squat and Resistance (BKA)  - 1 x daily - 7 x weekly - 3 sets - 10 reps - Hooklying Clamshell with Resistance  - 1 x daily - 7 x weekly - 1 sets - 20 reps - Clamshell with Resistance  - 1 x daily - 7 x weekly - 2 sets - 10 reps - Seated Hip Abduction with Resistance  - 1 x daily - 7 x weekly - 1 sets - 20 reps  ASSESSMENT:  CLINICAL IMPRESSION: Treatment emphasis on quad and gluteal activation ex's.  Used isometric strategies since less painful than repeated movements.  Noted right quad atrophy on right.  Therapist providing verbal cues to optimize technique with  exercises in order to achieve the greatest benefit and modifying ex's to reduce pain.     Eval: Patient is a 68 y.o. female who was seen today for physical therapy evaluation and treatment for right knee pain. She is a previous patient.  We were initially  addressing a right ankle issue but we gradually had to begin working on knee issues on same (right side) due to knee valgus and pain.  She presents with increased crepitus from last episode along with what appears to be an increased valgus angle compared to last episode.  She also shows some laxity and shifting on heel strike to stance phase of gait.  We had a detailed discussion last episode and again this episode about TKA and appropriate time to move fwd with this.  We discussed how her valgus angle is worsening and her crepitus is palpably more pronounced from last episode.  She understands that the TKA may be in her near future but would like to see if she can return to baseline of where she was prior to the falling.    OBJECTIVE IMPAIRMENTS: difficulty walking, decreased ROM, decreased strength, increased fascial restrictions, increased muscle spasms, postural dysfunction, and pain.   ACTIVITY LIMITATIONS: lifting, bending, standing, squatting, sleeping, stairs, transfers, bed mobility, bathing, toileting, dressing, and caring for others  PARTICIPATION LIMITATIONS: meal prep, cleaning, laundry, driving, shopping, community activity, and yard work  PERSONAL FACTORS: Fitness, Past/current experiences, and Time since onset of injury/illness/exacerbation are also affecting patient's functional outcome.   REHAB POTENTIAL: Good  CLINICAL DECISION MAKING: Stable/uncomplicated  EVALUATION COMPLEXITY: Low   GOALS: Goals reviewed with patient? Yes  SHORT TERM GOALS: Target date: 02/14/2024  Pain report to be no greater than 4/10  Baseline: Goal status: INITIAL  2.  Patient will be independent with initial HEP  Baseline:  Goal status: INITIAL   LONG TERM GOALS: Target date: 03/13/2024  Patient to report pain no greater than 2/10  Baseline:  Goal status: INITIAL  2.  Patient to be independent with advanced HEP  Baseline:  Goal status: INITIAL  3.  Patient to be able to stand or walk  for at least 15 min without right knee pain  Baseline:  Goal status: INITIAL  4.  Patient to be able to ascend and descend steps without pain or no greater than 2/10  Baseline:  Goal status: INITIAL  5.  Patient to be able to bend, stoop and squat with pain no greater than 2/10  Baseline:  Goal status: INITIAL  6.  LEFS score to be Baseline:  Goal status: INITIAL   PLAN:  PT FREQUENCY: 1-2x/week  PT DURATION: 8 weeks  PLANNED INTERVENTIONS: 97110-Therapeutic exercises, 97530- Therapeutic activity, 97112- Neuromuscular re-education, 97535- Self Care, 02859- Manual therapy, [phone]- Gait training, [phone]- Canalith repositioning, [id number]- Aquatic Therapy, [phone]- Splinting, H9716- Electrical stimulation (unattended), [phone]- Electrical stimulation (manual), [id number]- Vasopneumatic device, [id number]- Ultrasound, [id number]- Ionotophoresis 4mg /ml Dexamethasone, Patient/Family education, Balance training, Stair training, Taping, Joint mobilization, DME instructions, Cryotherapy, and Moist heat  PLAN FOR NEXT SESSION: Nustep, review HEP, progress quad strength and knee stability; glute strengthening   Glade Pesa, PT 02/07/24 6:34 PM Phone: [phone] Fax: [phone] [facility] [address] [place], [place] [address] Phone # [phone] Fax [phone]

## 2024-02-08 ENCOUNTER — Other Ambulatory Visit: Payer: PPO

## 2024-02-14 ENCOUNTER — Ambulatory Visit

## 2024-02-14 DIAGNOSIS — M6281 Muscle weakness (generalized): Secondary | ICD-10-CM

## 2024-02-14 DIAGNOSIS — R293 Abnormal posture: Secondary | ICD-10-CM

## 2024-02-14 DIAGNOSIS — M25561 Pain in right knee: Secondary | ICD-10-CM

## 2024-02-14 DIAGNOSIS — R262 Difficulty in walking, not elsewhere classified: Secondary | ICD-10-CM

## 2024-02-14 DIAGNOSIS — M79604 Pain in right leg: Secondary | ICD-10-CM

## 2024-02-14 DIAGNOSIS — M25661 Stiffness of right knee, not elsewhere classified: Secondary | ICD-10-CM

## 2024-02-14 NOTE — Therapy (Signed)
 OUTPATIENT PHYSICAL THERAPY LOWER EXTREMITY TREATMENT NOTE   Patient Name: Jamie Glover MRN: [id number] DOB:August 29, 1955, 68 y.o., female Today's Date: 02/14/2024  END OF SESSION:  PT End of Session - 02/14/24 1105     Visit Number 3    Date for PT Re-Evaluation 03/13/24    Authorization Type [employer]    PT Start Time 1103    PT Stop Time 1145    PT Time Calculation (min) 42 min    Activity Tolerance Patient tolerated treatment well    Behavior During Therapy WFL for tasks assessed/performed          Past Medical History:  Diagnosis Date   Arthritis    Hypertension    OSA (obstructive sleep apnea) 2013   Past Surgical History:  Procedure Laterality Date   BREAST BIOPSY Right    c-sections     CHOLECYSTECTOMY     CHOLECYSTECTOMY, LAPAROSCOPIC  1996   right knee surgery     Patient Active Problem List   Diagnosis Date Noted   Peroneal tendonitis, right 09/15/2021   Unilateral primary osteoarthritis, left knee 04/14/2020   Primary osteoarthritis of left knee 01/15/2019   Unilateral primary osteoarthritis, right knee 12/26/2017   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 ear 12/19/2017   Eustachian tube dysfunction, left 12/19/2017   Chronic serous otitis media of left ear 12/19/2017   Tinnitus 10/01/2011   Vitamin D deficiency 10/01/2011   GERD (gastroesophageal reflux disease) 10/01/2011   Neck pain 10/01/2011   HTN (hypertension) 10/01/2011   Sleep apnea 09/29/2011   BMI 33.0-33.9,adult 09/29/2011    PCP: Sharps Lacks, MD  REFERRING PROVIDER: Sharps Lacks, MD  REFERRING DIAG: M25.361 (ICD-10-CM) - Instability of right knee joint  THERAPY DIAG:  Acute pain of right knee  Stiffness of right knee, not elsewhere classified  Muscle weakness (generalized)  Difficulty in walking, not elsewhere classified  Abnormal posture  Pain in right leg  Rationale for Evaluation and Treatment:  Rehabilitation  ONSET DATE: 12/27/2023  SUBJECTIVE:   SUBJECTIVE STATEMENT: Patient reports she is still struggling with descending stairs.  Still feels like her hip is popping out when she is descending.     EVAL Patient was seen in PT at this clinic in September of last year for right ankle pain and right knee pain.  She reports she had been doing well for about 10 months and then she had 2 falls.  One where she had her hands full and could not see where she was walking and tripped over the pet gate threshold and the other fall was at church when she simply caught her toe on the floor and fell fwd.  She has been having right knee and hip pain since that time.  She wanted to try PT again to see if she could get back to baseline.    PERTINENT HISTORY: Right knee end stage OA with valgus deformity PAIN:  Are you having pain? Yes: NPRS scale: 0/10 right knee Pain location: right knee Pain description: aching Aggravating factors: stairs, inclines Relieving factors: rest, meds  PRECAUTIONS: None  RED FLAGS: None   WEIGHT BEARING RESTRICTIONS: No  FALLS:  Has patient fallen in last 6 months? No  LIVING ENVIRONMENT: Lives with: lives with their spouse Lives in: House/apartment Stairs: Yes: Internal: 17 steps; on right going up and External: 4 steps; on right going up Has following equipment at home: none  OCCUPATION: Retired  PLOF: Independent,  Independent with basic ADLs, Independent with household mobility without device, Independent with community mobility without device, Independent with homemaking with ambulation, Independent with gait, and Independent with transfers  PATIENT GOALS: to avoid more invasive treatment   NEXT MD VISIT: none  OBJECTIVE:  Note: Objective measures were completed at Evaluation unless otherwise noted.  DIAGNOSTIC FINDINGS: na  PATIENT SURVEYS:  LEFS  Extreme difficulty/unable (0), Quite a bit of difficulty (1), Moderate difficulty (2),  Little difficulty (3), No difficulty (4) Survey date:  01/17/24  Any of your usual work, housework or school activities 4  2. Usual hobbies, recreational or sporting activities 3  3. Getting into/out of the bath 4  4. Walking between rooms 4  5. Putting on socks/shoes 4  6. Squatting  4  7. Lifting an object, like a bag of groceries from the floor 4  8. Performing light activities around your home 4  9. Performing heavy activities around your home 3  10. Getting into/out of a car 4  11. Walking 2 blocks 4  12. Walking 1 mile 4  13. Going up/down 10 stairs (1 flight) 3  14. Standing for 1 hour 3  15.  sitting for 1 hour 4  16. Running on even ground 3  17. Running on uneven ground 2  18. Making sharp turns while running fast 0  19. Hopping  2  20. Rolling over in bed 4  Score total:  63/80     COGNITION: Overall cognitive status: Within functional limits for tasks assessed     SENSATION: WFL  EDEMA:  Circumferential: Right : Mid patella:         Left Mid patella:       POSTURE: bilateral knee valgus with pivot shift on heel strike on right knee  PALPATION: Moderate to severe crepitus bilateral knees right > left.   LOWER EXTREMITY ROM:  WFL  LOWER EXTREMITY MMT:  Generally 4 to 4+/5,  severe patellar crepitus on resisted knee extension and flexion on right side  LOWER EXTREMITY SPECIAL TESTS:  Knee special tests: Patellafemoral apprehension test: negative and Patellafemoral grind test: positive   FUNCTIONAL TESTS:  5 times sit to stand: 10.56 sec Timed up and go (TUG): 7.58 sec  GAIT: Distance walked: 50 feet Assistive device utilized: None Level of assistance: SBA Comments: antalgic gait with right knee valgus and instability (joint laxity and shifting noticed on heel strike through stance phase)                                                                                                                                TREATMENT DATE:  02/14/24 Patient  had several questions about her hip activity on descending steps Gait training at stairs with mirror x 15 min focusing on form and avoiding shearing forces and valgus position Squats and sit to stand in front of mirror 2 x 10 each with focus on alignment Eccentric heel taps  off 2 inch step attempted: patient did approx 10 reps but with increasing pain with each visit, therefore we discontinued this Squat to chair with 2 balance pads in tandem attempted,  still unable to do without significant pain.   Educated patient, once again, on the severity of her deformity and that if she is wanting to hold off on the TKA, she will likely have to do any eccentric loading with a compensatory strategy such as step to gait on descending steps and stepping down from a curb.  Spent as much as 10 min in front of mirror explaining how her quad and hips should be quite strong by now with the amount of strengthening we have done with her here on both episodes of PT and what she is doing on her own.    02/07/24 Nu-Step L5 8 min while discussing status and plan for session Review of initial HEP 4 inch forward step up 10x (mild pain) 4 inch lateral step up 8x (more pain than forward step up)  From 4 inch isometric hold on right leg, left hip abduction 3 sets of 5  LAQ 5# 3 sec hold 3 sec 10x 2 HS curls green  band 10x SLS standing tall neuromuscular re-education in front of the mirror to activate gluteals Isometric quad against green ball 10 sec holds Pt declines written and pictoral HEP but makes notes on her phone 01/17/24 Initiated HEP and initial eval completed    PATIENT EDUCATION:  Education details: Educated on postural alignment and importance of quad strength and proper foot wear Person educated: Patient Education method: Programmer, multimedia, Facilities manager, Verbal cues, and Handouts Education comprehension: verbalized understanding, returned demonstration, and verbal cues required  HOME EXERCISE  PROGRAM: Access Code: [email or web address] URL: [email or web address] Date: 01/17/2024 Prepared by: Delon Haddock  Exercises - Side Stepping with Resistance at Ankles  - 1 x daily - 7 x weekly - 3 sets - 10 reps - Lateral Monster Walk with Squat and Resistance (BKA)  - 1 x daily - 7 x weekly - 3 sets - 10 reps - Hooklying Clamshell with Resistance  - 1 x daily - 7 x weekly - 1 sets - 20 reps - Clamshell with Resistance  - 1 x daily - 7 x weekly - 2 sets - 10 reps - Seated Hip Abduction with Resistance  - 1 x daily - 7 x weekly - 1 sets - 20 reps  ASSESSMENT:  CLINICAL IMPRESSION: Jamie Glover has reached a point of deformity that will not likely be overcome with therapy.  She is doing well functionally with all tasks with exception of any type of eccentric lowering.  The quad is becoming more atrophied and she is unable to control the valgus moment well even with heavy verbal and visual cues despite the amount of strengthening we have done with her on both episodes of her PT.  She has the compounding factor of the right ankle deformity as well.  This promotes the valgus deformity even further.  She agrees that she needs the TKA but was hoping to put it off as long as possible.  We will do one additional visit for DC planning.      Eval: Patient is a 68 y.o. female who was seen today for physical therapy evaluation and treatment for right knee pain. She is a previous patient.  We were initially addressing a right ankle issue but we gradually had to begin working on knee issues on same (right side) due to knee valgus and  pain.  She presents with increased crepitus from last episode along with what appears to be an increased valgus angle compared to last episode.  She also shows some laxity and shifting on heel strike to stance phase of gait.  We had a detailed discussion last episode and again this episode about TKA and appropriate time to move fwd with this.  We discussed how her valgus angle is  worsening and her crepitus is palpably more pronounced from last episode.  She understands that the TKA may be in her near future but would like to see if she can return to baseline of where she was prior to the falling.    OBJECTIVE IMPAIRMENTS: difficulty walking, decreased ROM, decreased strength, increased fascial restrictions, increased muscle spasms, postural dysfunction, and pain.   ACTIVITY LIMITATIONS: lifting, bending, standing, squatting, sleeping, stairs, transfers, bed mobility, bathing, toileting, dressing, and caring for others  PARTICIPATION LIMITATIONS: meal prep, cleaning, laundry, driving, shopping, community activity, and yard work  PERSONAL FACTORS: Fitness, Past/current experiences, and Time since onset of injury/illness/exacerbation are also affecting patient's functional outcome.   REHAB POTENTIAL: Good  CLINICAL DECISION MAKING: Stable/uncomplicated  EVALUATION COMPLEXITY: Low   GOALS: Goals reviewed with patient? Yes  SHORT TERM GOALS: Target date: 02/14/2024  Pain report to be no greater than 4/10  Baseline: Goal status: MET 02/14/24 (only hurts when doing eccentric activities)  2.  Patient will be independent with initial HEP  Baseline:  Goal status: MET 02/14/24   LONG TERM GOALS: Target date: 03/13/2024  Patient to report pain no greater than 2/10  Baseline:  Goal status: INITIAL  2.  Patient to be independent with advanced HEP  Baseline:  Goal status: INITIAL  3.  Patient to be able to stand or walk for at least 15 min without right knee pain  Baseline:  Goal status: INITIAL  4.  Patient to be able to ascend and descend steps without pain or no greater than 2/10  Baseline:  Goal status: INITIAL  5.  Patient to be able to bend, stoop and squat with pain no greater than 2/10  Baseline:  Goal status: INITIAL  6.  LEFS score to be Baseline:  Goal status: INITIAL   PLAN:  PT FREQUENCY: 1-2x/week  PT DURATION: 8 weeks  PLANNED  INTERVENTIONS: 97110-Therapeutic exercises, 97530- Therapeutic activity, [id number]- Neuromuscular re-education, 97535- Self Care, [id number]- Manual therapy, [id number]- Gait training, [phone]- Canalith repositioning, [id number]- Aquatic Therapy, [id number]- Splinting, [id number]- Electrical stimulation (unattended), [id number]- Electrical stimulation (manual), [id number]- Vasopneumatic device, [id number]- Ultrasound, [id number]- Ionotophoresis 4mg /ml Dexamethasone, Patient/Family education, Balance training, Stair training, Taping, Joint mobilization, DME instructions, Cryotherapy, and Moist heat  PLAN FOR NEXT SESSION: Patient would like to DC next visit.  Organize and refresh HEP and review.  Educate on DC plan and how to progress, when appropriate for TKA (patient is actually alread aware of need for TKA and is just hoping to buy some time)  Delon B. Reannah Totten, PT 02/14/24 2:34 PM [facility] [address] [place], [place] [address] Phone # [phone] Fax [phone]

## 2024-02-21 ENCOUNTER — Ambulatory Visit

## 2024-02-21 DIAGNOSIS — M79604 Pain in right leg: Secondary | ICD-10-CM

## 2024-02-21 DIAGNOSIS — M25661 Stiffness of right knee, not elsewhere classified: Secondary | ICD-10-CM

## 2024-02-21 DIAGNOSIS — R293 Abnormal posture: Secondary | ICD-10-CM

## 2024-02-21 DIAGNOSIS — R262 Difficulty in walking, not elsewhere classified: Secondary | ICD-10-CM

## 2024-02-21 DIAGNOSIS — M6281 Muscle weakness (generalized): Secondary | ICD-10-CM

## 2024-02-21 DIAGNOSIS — M25561 Pain in right knee: Secondary | ICD-10-CM

## 2024-02-21 DIAGNOSIS — R252 Cramp and spasm: Secondary | ICD-10-CM

## 2024-02-21 NOTE — Therapy (Signed)
 OUTPATIENT PHYSICAL THERAPY LOWER EXTREMITY TREATMENT NOTE PHYSICAL THERAPY DISCHARGE SUMMARY  Visits from Start of Care: 4  Current functional level related to goals / functional outcomes: See below   Remaining deficits: See below   Education / Equipment: See below   Patient agrees to discharge. Patient goals were met. Patient is being discharged due to meeting the stated rehab goals.    Patient Name: Jamie Glover MRN: [id number] DOB:[birth year], 68 y.o., female Today's Date: 02/21/2024  END OF SESSION:  PT End of Session - 02/21/24 0800     Visit Number 4    Date for Recertification  03/13/24    Authorization Type [employer]    PT Start Time 0800    PT Stop Time 0842    PT Time Calculation (min) 42 min    Activity Tolerance Patient tolerated treatment well    Behavior During Therapy [facility] for tasks assessed/performed          Past Medical History:  Diagnosis Date   Arthritis    Hypertension    OSA (obstructive sleep apnea) 2013   Past Surgical History:  Procedure Laterality Date   BREAST BIOPSY Right    c-sections     CHOLECYSTECTOMY     CHOLECYSTECTOMY, LAPAROSCOPIC  1996   right knee surgery     Patient Active Problem List   Diagnosis Date Noted   Peroneal tendonitis, right 09/15/2021   Unilateral primary osteoarthritis, left knee 04/14/2020   Primary osteoarthritis of left knee 01/15/2019   Unilateral primary osteoarthritis, right knee 12/26/2017   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 ear 12/19/2017   Eustachian tube dysfunction, left 12/19/2017   Chronic serous otitis media of left ear 12/19/2017   Tinnitus 10/01/2011   Vitamin D deficiency 10/01/2011   GERD (gastroesophageal reflux disease) 10/01/2011   Neck pain 10/01/2011   HTN (hypertension) 10/01/2011   Sleep apnea 09/29/2011   BMI 33.0-33.9,adult 09/29/2011    PCP: Sharps Lacks, MD  REFERRING PROVIDER: Sharps Lacks,  MD  REFERRING DIAG: M25.361 (ICD-10-CM) - Instability of right knee joint  THERAPY DIAG:  Acute pain of right knee  Stiffness of right knee, not elsewhere classified  Muscle weakness (generalized)  Difficulty in walking, not elsewhere classified  Pain in right leg  Cramp and spasm  Abnormal posture  Rationale for Evaluation and Treatment: Rehabilitation  ONSET DATE: 12/27/2023  SUBJECTIVE:   SUBJECTIVE STATEMENT: Patient reports she did well after last visit.  Only sore in the hips.  Would like to review for gym routine to make sure she is doing everything in the gym correctly.      EVAL Patient was seen in PT at this clinic in September of last year for right ankle pain and right knee pain.  She reports she had been doing well for about 10 months and then she had 2 falls.  One where she had her hands full and could not see where she was walking and tripped over the pet gate threshold and the other fall was at church when she simply caught her toe on the floor and fell fwd.  She has been having right knee and hip pain since that time.  She wanted to try PT again to see if she could get back to baseline.    PERTINENT HISTORY: Right knee end stage OA with valgus deformity PAIN:  Are you having pain? Yes: NPRS scale: 0/10 right knee Pain location: right knee Pain description:  aching Aggravating factors: stairs, inclines Relieving factors: rest, meds  PRECAUTIONS: None  RED FLAGS: None   WEIGHT BEARING RESTRICTIONS: No  FALLS:  Has patient fallen in last 6 months? No  LIVING ENVIRONMENT: Lives with: lives with their spouse Lives in: House/apartment Stairs: Yes: Internal: 17 steps; on right going up and External: 4 steps; on right going up Has following equipment at home: none  OCCUPATION: Retired  PLOF: Independent, Independent with basic ADLs, Independent with household mobility without device, Independent with community mobility without device, Independent  with homemaking with ambulation, Independent with gait, and Independent with transfers  PATIENT GOALS: to avoid more invasive treatment   NEXT MD VISIT: none  OBJECTIVE:  Note: Objective measures were completed at Evaluation unless otherwise noted.  DIAGNOSTIC FINDINGS: na  PATIENT SURVEYS:  LEFS  Extreme difficulty/unable (0), Quite a bit of difficulty (1), Moderate difficulty (2), Little difficulty (3), No difficulty (4) Survey date:  01/17/24 02/21/24  Any of your usual work, housework or school activities 4   2. Usual hobbies, recreational or sporting activities 3   3. Getting into/out of the bath 4   4. Walking between rooms 4   5. Putting on socks/shoes 4   6. Squatting  4   7. Lifting an object, like a bag of groceries from the floor 4   8. Performing light activities around your home 4   9. Performing heavy activities around your home 3   10. Getting into/out of a car 4   11. Walking 2 blocks 4   12. Walking 1 mile 4   13. Going up/down 10 stairs (1 flight) 3   14. Standing for 1 hour 3   15.  sitting for 1 hour 4   16. Running on even ground 3   17. Running on uneven ground 2   18. Making sharp turns while running fast 0   19. Hopping  2   20. Rolling over in bed 4   Score total:  63/80 62/80     COGNITION: Overall cognitive status: Within functional limits for tasks assessed     SENSATION: WFL  EDEMA:  Circumferential: Right : Mid patella:         Left Mid patella:       POSTURE: bilateral knee valgus with pivot shift on heel strike on right knee  PALPATION: Moderate to severe crepitus bilateral knees right > left.   LOWER EXTREMITY ROM:  WFL  LOWER EXTREMITY MMT:  Generally 4 to 4+/5,  severe patellar crepitus on resisted knee extension and flexion on right side  LOWER EXTREMITY SPECIAL TESTS:  Knee special tests: Patellafemoral apprehension test: negative and Patellafemoral grind test: positive   FUNCTIONAL TESTS:  Initial eval: 5 times sit  to stand: 10.56 sec Timed up and go (TUG): 7.58 sec  02/21/24: 5 times sit to stand: 9.87 sec Timed up and go (TUG): 6.52 sec  GAIT: Distance walked: 50 feet Assistive device utilized: None Level of assistance: SBA Comments: antalgic gait with right knee valgus and instability (joint laxity and shifting noticed on heel strike through stance phase)  TREATMENT DATE:  02/21/24 Nustep x 8 min level 5 Went through entire gym routine for patient to follow: educated on proper settings and weight Reviewed HEP and how to progress DC assessment completed  02/14/24 Patient had several questions about her hip activity on descending steps Gait training at stairs with mirror x 15 min focusing on form and avoiding shearing forces and valgus position Squats and sit to stand in front of mirror 2 x 10 each with focus on alignment Eccentric heel taps off 2 inch step attempted: patient did approx 10 reps but with increasing pain with each visit, therefore we discontinued this Squat to chair with 2 balance pads in tandem attempted,  still unable to do without significant pain.   Educated patient, once again, on the severity of her deformity and that if she is wanting to hold off on the TKA, she will likely have to do any eccentric loading with a compensatory strategy such as step to gait on descending steps and stepping down from a curb.  Spent as much as 10 min in front of mirror explaining how her quad and hips should be quite strong by now with the amount of strengthening we have done with her here on both episodes of PT and what she is doing on her own.    02/07/24 Nu-Step L5 8 min while discussing status and plan for session Review of initial HEP 4 inch forward step up 10x (mild pain) 4 inch lateral step up 8x (more pain than forward step up)  From 4 inch isometric hold on right  leg, left hip abduction 3 sets of 5  LAQ 5# 3 sec hold 3 sec 10x 2 HS curls green  band 10x SLS standing tall neuromuscular re-education in front of the mirror to activate gluteals Isometric quad against green ball 10 sec holds Pt declines written and pictoral HEP but makes notes on her phone 01/17/24 Initiated HEP and initial eval completed    PATIENT EDUCATION:  Education details: Educated on postural alignment and importance of quad strength and proper foot wear Person educated: Patient Education method: Programmer, multimedia, Facilities manager, Verbal cues, and Handouts Education comprehension: verbalized understanding, returned demonstration, and verbal cues required  HOME EXERCISE PROGRAM: Access Code: [email or web address] URL: [email or web address] Date: 01/17/2024 Prepared by: Delon Haddock  Exercises - Side Stepping with Resistance at Ankles  - 1 x daily - 7 x weekly - 3 sets - 10 reps - Lateral Monster Walk with Squat and Resistance (BKA)  - 1 x daily - 7 x weekly - 3 sets - 10 reps - Hooklying Clamshell with Resistance  - 1 x daily - 7 x weekly - 1 sets - 20 reps - Clamshell with Resistance  - 1 x daily - 7 x weekly - 2 sets - 10 reps - Seated Hip Abduction with Resistance  - 1 x daily - 7 x weekly - 1 sets - 20 reps  ASSESSMENT:  CLINICAL IMPRESSION: Syana has met all goals and is very compliant and well motivated.  Her functional scores are improved but LEFS was about the same.  She has membership at local fitness facility and does her HEP regularly.  Her knees are both end stage right > left.  She will likely need TKA at some point.  We discussed appropriate time to do this before deformities begin to affect the kinetic chain and she develops hip and back pain.  She was able to return understanding of this.  We also discussed installing handrails at  entrance to home on right side to improve safety and allow her to do step to gait with improved safety.  We will DC at this time.     Eval: Patient is a 68 y.o. female who was seen today for physical therapy evaluation and treatment for right knee pain. She is a previous patient.  We were initially addressing a right ankle issue but we gradually had to begin working on knee issues on same (right side) due to knee valgus and pain.  She presents with increased crepitus from last episode along with what appears to be an increased valgus angle compared to last episode.  She also shows some laxity and shifting on heel strike to stance phase of gait.  We had a detailed discussion last episode and again this episode about TKA and appropriate time to move fwd with this.  We discussed how her valgus angle is worsening and her crepitus is palpably more pronounced from last episode.  She understands that the TKA may be in her near future but would like to see if she can return to baseline of where she was prior to the falling.    OBJECTIVE IMPAIRMENTS: difficulty walking, decreased ROM, decreased strength, increased fascial restrictions, increased muscle spasms, postural dysfunction, and pain.   ACTIVITY LIMITATIONS: lifting, bending, standing, squatting, sleeping, stairs, transfers, bed mobility, bathing, toileting, dressing, and caring for others  PARTICIPATION LIMITATIONS: meal prep, cleaning, laundry, driving, shopping, community activity, and yard work  PERSONAL FACTORS: Fitness, Past/current experiences, and Time since onset of injury/illness/exacerbation are also affecting patient's functional outcome.   REHAB POTENTIAL: Good  CLINICAL DECISION MAKING: Stable/uncomplicated  EVALUATION COMPLEXITY: Low   GOALS: Goals reviewed with patient? Yes  SHORT TERM GOALS: Target date: 02/14/2024  Pain report to be no greater than 4/10  Baseline: Goal status: MET 02/14/24 (only hurts when doing eccentric activities)  2.  Patient will be independent with initial HEP  Baseline:  Goal status: MET 02/14/24   LONG TERM GOALS: Target  date: 03/13/2024  Patient to report pain no greater than 2/10  Baseline:  Goal status: MET 02/21/24  2.  Patient to be independent with advanced HEP  Baseline:  Goal status: MET 02/21/24  3.  Patient to be able to stand or walk for at least 15 min without right knee pain  Baseline:  Goal status: MET 02/21/24  4.  Patient to be able to ascend and descend steps without pain or no greater than 2/10  Baseline:  Goal status: MET if she does step to gait 02/21/24  5.  Patient to be able to bend, stoop and squat with pain no greater than 2/10  Baseline:  Goal status: MET with modifications 02/21/24  6.  LEFS score to be within 1-2 points of current score Baseline:  Goal status: MET 02/21/24   PLAN:  PT FREQUENCY: 1-2x/week  PT DURATION: 8 weeks  PLANNED INTERVENTIONS: 97110-Therapeutic exercises, 97530- Therapeutic activity, 97112- Neuromuscular re-education, 97535- Self Care, [id number]- Manual therapy, [id number]- Gait training, [phone]- Canalith repositioning, [id number]- Aquatic Therapy, [id number]- Splinting, [id number]- Electrical stimulation (unattended), [id number]- Electrical stimulation (manual), [id number]- Vasopneumatic device, [id number]- Ultrasound, [id number]- Ionotophoresis 4mg /ml Dexamethasone, Patient/Family education, Balance training, Stair training, Taping, Joint mobilization, DME instructions, Cryotherapy, and Moist heat  PLAN FOR NEXT SESSION: DC at this time  Eldred B. Samarie Pinder, PT 02/21/24 9:03 AM [facility] [address] [place], [place] [address] Phone # [phone] Fax [phone]

## 2024-02-28 ENCOUNTER — Encounter

## 2024-03-06 ENCOUNTER — Encounter

## 2024-03-13 ENCOUNTER — Encounter

## 2024-04-18 ENCOUNTER — Other Ambulatory Visit: Payer: Self-pay

## 2024-04-18 ENCOUNTER — Ambulatory Visit: Admitting: Orthopaedic Surgery

## 2024-04-18 DIAGNOSIS — M25561 Pain in right knee: Secondary | ICD-10-CM

## 2024-04-18 DIAGNOSIS — G8929 Other chronic pain: Secondary | ICD-10-CM

## 2024-04-18 DIAGNOSIS — M1711 Unilateral primary osteoarthritis, right knee: Secondary | ICD-10-CM | POA: Diagnosis not present

## 2024-04-18 NOTE — Progress Notes (Signed)
 Jamie Glover comes in today as a relates to her right knee.  She has had on and off pain for many years in that knee.  She has a very remote history of a partial lateral meniscectomy with that knee and its led to more valgus alignment with time.  She has a very high pain tolerance of the knee does not hurt much and she has had steroid injections in the past as well as gel injections.  What has been more of an issue is her following.  She is a very young and active 68 year old female but her fall risk is been heightened and she has had some falls as a relates to her valgus malalignment of the right knee.  She has been through extensive physical therapy as well.  Even when she stands she can stated that left knee is much straighter than the right knee.  The left knee has just slight valgus malalignment of her right knee is more valgus.  He can see that when she walks as well.  She has excellent range of motion of the knee and does not seem to be significant painful to her.  An AP standing view of the right knee also shows a left knee on the AP view and the lateral view does confirm significant arthritis of the right knee with valgus malalignment and this mainly affects the lateral compartment and the patellofemoral joint.  We had a long and thorough discussion about knee replacement surgery.  Certainly that could help her alignment.  She had appropriate questions even about pain management after surgery.  Her sister had successful knee replacement surgery and had some type of indwelling pain pump type of pain ball that did help her with pain control for the first few days getting her knee bending and moving.  I did explain that something we have not done here in [place] and there is always concerned about indwelling catheter that could lead to the potential for infection.  We did have a very pleasant and long discussion about her knee and all questions concerns were addressed and answered as best possible.
